# Patient Record
Sex: Male | Born: 1935 | Race: White | Hispanic: No | Marital: Married | Smoking: Former smoker
Health system: Southern US, Community
[De-identification: ages and names within clinical notes are randomized; demographics above are authoritative.]

## PROBLEM LIST (undated history)

## (undated) DIAGNOSIS — N419 Inflammatory disease of prostate, unspecified: Secondary | ICD-10-CM

## (undated) DIAGNOSIS — G459 Transient cerebral ischemic attack, unspecified: Secondary | ICD-10-CM

## (undated) DIAGNOSIS — N289 Disorder of kidney and ureter, unspecified: Secondary | ICD-10-CM

## (undated) DIAGNOSIS — I10 Essential (primary) hypertension: Secondary | ICD-10-CM

## (undated) DIAGNOSIS — E785 Hyperlipidemia, unspecified: Secondary | ICD-10-CM

## (undated) DIAGNOSIS — I639 Cerebral infarction, unspecified: Secondary | ICD-10-CM

## (undated) DIAGNOSIS — K219 Gastro-esophageal reflux disease without esophagitis: Secondary | ICD-10-CM

## (undated) DIAGNOSIS — I4891 Unspecified atrial fibrillation: Secondary | ICD-10-CM

## (undated) DIAGNOSIS — F0390 Unspecified dementia without behavioral disturbance: Secondary | ICD-10-CM

## (undated) HISTORY — PX: SKIN CANCER EXCISION: SHX779

---

## 2002-09-18 ENCOUNTER — Ambulatory Visit (HOSPITAL_COMMUNITY): Admission: RE | Admit: 2002-09-18 | Discharge: 2002-09-18 | Payer: Self-pay | Admitting: Nephrology

## 2002-09-19 ENCOUNTER — Inpatient Hospital Stay (HOSPITAL_COMMUNITY): Admission: RE | Admit: 2002-09-19 | Discharge: 2002-09-21 | Payer: Self-pay | Admitting: Nephrology

## 2002-09-19 ENCOUNTER — Encounter: Payer: Self-pay | Admitting: Nephrology

## 2004-12-31 ENCOUNTER — Ambulatory Visit: Payer: Self-pay | Admitting: Dermatology

## 2005-04-14 ENCOUNTER — Ambulatory Visit: Payer: Self-pay | Admitting: Unknown Physician Specialty

## 2007-12-06 ENCOUNTER — Ambulatory Visit: Payer: Self-pay | Admitting: Internal Medicine

## 2008-09-30 ENCOUNTER — Ambulatory Visit: Payer: Self-pay | Admitting: Internal Medicine

## 2009-10-31 ENCOUNTER — Emergency Department: Payer: Self-pay | Admitting: Emergency Medicine

## 2013-05-29 ENCOUNTER — Observation Stay: Payer: Self-pay | Admitting: Internal Medicine

## 2013-05-29 DIAGNOSIS — I059 Rheumatic mitral valve disease, unspecified: Secondary | ICD-10-CM

## 2013-05-29 LAB — COMPREHENSIVE METABOLIC PANEL
ALBUMIN: 3.8 g/dL (ref 3.4–5.0)
ALK PHOS: 50 U/L
ALT: 17 U/L (ref 12–78)
ANION GAP: 5 — AB (ref 7–16)
BILIRUBIN TOTAL: 0.6 mg/dL (ref 0.2–1.0)
BUN: 21 mg/dL — ABNORMAL HIGH (ref 7–18)
CALCIUM: 9.2 mg/dL (ref 8.5–10.1)
CHLORIDE: 105 mmol/L (ref 98–107)
CO2: 28 mmol/L (ref 21–32)
CREATININE: 1.43 mg/dL — AB (ref 0.60–1.30)
GFR CALC AF AMER: 50 — AB
GFR CALC NON AF AMER: 43 — AB
GLUCOSE: 109 mg/dL — AB (ref 65–99)
OSMOLALITY: 279 (ref 275–301)
POTASSIUM: 4.5 mmol/L (ref 3.5–5.1)
SGOT(AST): 14 U/L — ABNORMAL LOW (ref 15–37)
SODIUM: 138 mmol/L (ref 136–145)
Total Protein: 7.5 g/dL (ref 6.4–8.2)

## 2013-05-29 LAB — URINALYSIS, COMPLETE
Bacteria: NONE SEEN
Bilirubin,UR: NEGATIVE
Blood: NEGATIVE
Glucose,UR: NEGATIVE mg/dL (ref 0–75)
Hyaline Cast: 5
Ketone: NEGATIVE
Leukocyte Esterase: NEGATIVE
Nitrite: NEGATIVE
PH: 5 (ref 4.5–8.0)
Protein: NEGATIVE
RBC, UR: NONE SEEN /HPF (ref 0–5)
Specific Gravity: 1.013 (ref 1.003–1.030)
Squamous Epithelial: NONE SEEN

## 2013-05-29 LAB — CBC
HCT: 42.4 % (ref 40.0–52.0)
HGB: 13.8 g/dL (ref 13.0–18.0)
MCH: 29.2 pg (ref 26.0–34.0)
MCHC: 32.7 g/dL (ref 32.0–36.0)
MCV: 89 fL (ref 80–100)
PLATELETS: 140 10*3/uL — AB (ref 150–440)
RBC: 4.74 10*6/uL (ref 4.40–5.90)
RDW: 13.6 % (ref 11.5–14.5)
WBC: 6.2 10*3/uL (ref 3.8–10.6)

## 2013-05-29 LAB — TROPONIN I: TROPONIN-I: 0.02 ng/mL

## 2013-05-30 LAB — LIPID PANEL
Cholesterol: 128 mg/dL (ref 0–200)
HDL: 43 mg/dL (ref 40–60)
LDL CHOLESTEROL, CALC: 58 mg/dL (ref 0–100)
TRIGLYCERIDES: 137 mg/dL (ref 0–200)
VLDL Cholesterol, Calc: 27 mg/dL (ref 5–40)

## 2014-05-04 NOTE — H&P (Signed)
PATIENT NAME:  Albert Boone, STURGES MR#:  [id number] DATE OF BIRTH:  1922-03-26  DATE OF ADMISSION:  05/29/2013  PRIMARY CARE PHYSICIAN:  Dr. Emily Filbert    CHIEF COMPLAINT: Altered mental status and problems with speech.   HISTORY OF PRESENT ILLNESS: This is an 81 year old Caucasian male patient with history of questionable seizures, hypertension, chronic back pain who presents to the Emergency Room, brought in after his wife noticed that on waking up from sleep patient was confused. His symptoms lasted a total of 1 hour. He also had problems with his speech, not answering questions.  It sounds like aphasia. Here in the Emergency Room, the patient's symptoms have resolved. Initially, he was a little confused. He was talking about a meeting he was going to but could not remember what he was going for, but presently, he is alert and oriented x 3 back to normal. His son confirms that he is back to baseline.   He has not had any focal weakness or numbness. No change in medications. Blood glucose is normal.   PAST MEDICAL HISTORY:  1. Hypertension.  2. Chronic back pain.  3. Questionable seizures. Not on any medications.  4. Kidney biopsy in the past.   SOCIAL HISTORY: The patient ambulates on his own. He works out at [employer] every day. Does not smoke. Quit smoking in 1968.  Has occasional alcohol, 1 drink. No illicit drug use.   CODE STATUS:  Full code.  FAMILY HISTORY: Hypertension and stroke.   REVIEW OF SYSTEMS:  CONSTITUTIONAL: No fever, fatigue, weakness.  EYES: No blurred vision, pain, or redness.  EARS, NOSE, AND THROAT:  No tinnitus, ear pain, hearing loss.  RESPIRATORY: No cough, wheeze, hemoptysis.  CARDIOVASCULAR: No chest pain, orthopnea, edema.  GASTROINTESTINAL: No nausea, vomiting, diarrhea, abdominal pain.  GENITOURINARY: No dysuria, hematuria, frequency.  ENDOCRINE: No polyuria, nocturia, or thyroid problems. HEMATOLOGY AND LYMPHATIC: No anemia, easy bruising, bleeding.   INTEGUMENTARY: No acne, rash, lesion.  MUSCULOSKELETAL: Has chronic back pain.  NEUROLOGICAL: Had speech problems and confusion.  No focal numbness, weakness.  PSYCHIATRIC: No anxiety or depression.   HOME MEDICATION:  Not available at this time.   PHYSICAL EXAMINATION:  VITAL SIGNS: Temperature 97.8, pulse 68, respirations 18, blood pressure 148/79, saturating 96% on room air.  GENERAL: Obese Caucasian male patient sitting up in bed, seems comfortable, conversational, cooperative with exam.  PSYCHIATRIC: Alert and oriented x 3. Mood and affect appropriate. Judgment intact.  He is pleasant. HEENT: Atraumatic, normocephalic. Oral mucosa moist and pink. External ears and nose normal. No pallor. No icterus. Pupils bilaterally equal and reactive to light. No facial droop.  NECK: Supple. No thyromegaly or palpable lymph nodes. . Trachea midline. No carotid bruit, JVD.  CARDIOVASCULAR: S1, S2, without any murmurs. Peripheral pulses 2+.  No edema. RESPIRATORY: Normal work of breathing. Clear to auscultation on both sides.  GASTROINTESTINAL: Soft abdomen, nontender. Bowel sounds present. No  hepatosplenomegaly palpable.  GENITOURINARY: No significant bladder distention.  SKIN:  Warm and dry.  No petechiae,  rash, ulcers. MUSCULOSKELETAL: No joint swelling, redness or effusion in large joints. Normal muscle tone.  NEUROLOGICAL: Motor strength 5/5 in upper and lower extremities. Sensation is intact all over. Reflexes 2+. Cranial nerves II through XII intact.  LYMPHATIC: No cervical lymphadenopathy.   LABORATORY STUDIES: Show glucose 109, BUN 21, creatinine 1.43, sodium 130, potassium 4.5, chloride 105, bicarbonate 28. AST, ALT, alkaline phosphatase, bilirubin normal. Troponin less than 0.02. hemoglobin  13.8, platelets of 140,000.  RADIOLOGICAL DATA:  EKG shows sinus rhythm, right bundle branch block.   CT scan of the head without contrast: Generalized cerebral atrophy. No acute stroke. No mass  hemorrhage.   ASSESSMENT AND PLAN:  1. Acute confusion and problems with speech in a patient with hypertension. The patient's symptoms lasted a total of 1 hour. Blood pressure is elevated at 170/72. We will admit the patient under observation for TIA. Put him on a tele floor. Get neuro checks q. 4 hours. Check MRI, carotid Dopplers, and echocardiogram. We will check a fasting lipid profile. Put him on aspirin, statin. Presently, symptoms have resolved. Further management as per the test results in progress.  2. Hypertension, uncontrolled. Mildly elevated could be secondary to a stroke he could have had. Will continue his home medications and monitor the blood pressure.  3. Chronic kidney disease III. Creatinine is 1.43. He does mention he had a kidney biopsy in the past. Baseline is unknown. Needs to be monitored.  4. Deep vein thrombosis prophylaxis with Lovenox.   TIME SPENT TODAY ON THIS CASE:  45 minutes.    ____________________________ Leia Alf Kumiko Fishman, MD srs:dd D: 05/29/2013 17:36:33 ET T: 05/29/2013 18:00:30 ET JOB#: [id number]  cc: Alveta Heimlich R. Darvin Neighbours, MD, <Dictator> Rusty Aus, MD  Neita Carp MD ELECTRONICALLY SIGNED 06/08/2013 14:15

## 2014-05-04 NOTE — Discharge Summary (Signed)
PATIENT NAME:  Albert Boone, Albert Boone MR#:  [id number] DATE OF BIRTH:  1922/08/02  DATE OF ADMISSION:  05/29/2013 DATE OF DISCHARGE:  05/30/2013  DISCHARGE DIAGNOSES: 1.  Transient ischemic attack.  2.  Renal insufficiency. 3.  Hypertension.   DISCHARGE MEDICATIONS:  Omeprazole 20 mg b.i.d., valsartan 80 mg daily, simvastatin 20 mg at bedtime, terazosin 5 mg at bedtime, HCTZ 25 mg 1/2 tab daily, Plavix 75 mg daily.   REASON FOR ADMISSION: An 81 year old male presents with episode of altered mental status and slurred speech, consistent with TIA. Please see H and P for HPI, past medical history and physical exam.   HOSPITAL COURSE: The patient was admitted. Enzymes negative. Brain MRI negative. Carotid Doppler negative.  Blood pressure readily came down. He was asymptomatic, really on presentation to the ED. Since he cannot take any anti-inflammatories, he will go on Plavix for at least 1 month to follow up with Dr. Sabra Heck in 1 week. Any further symptoms, he will let me know.   ____________________________ Rusty Aus, MD mfm:ce D: 05/30/2013 15:33:45 ET T: 05/30/2013 19:11:02 ET JOB#: [id number]  cc: Rusty Aus, MD, <Dictator> Aspen Deterding Roselee Culver MD ELECTRONICALLY SIGNED 06/01/2013 8:30

## 2015-01-23 DIAGNOSIS — H353133 Nonexudative age-related macular degeneration, bilateral, advanced atrophic without subfoveal involvement: Secondary | ICD-10-CM | POA: Diagnosis not present

## 2015-01-23 DIAGNOSIS — Z961 Presence of intraocular lens: Secondary | ICD-10-CM | POA: Diagnosis not present

## 2015-02-14 DIAGNOSIS — M25511 Pain in right shoulder: Secondary | ICD-10-CM | POA: Diagnosis not present

## 2015-02-17 DIAGNOSIS — M47812 Spondylosis without myelopathy or radiculopathy, cervical region: Secondary | ICD-10-CM | POA: Diagnosis not present

## 2015-02-17 DIAGNOSIS — M5010 Cervical disc disorder with radiculopathy, unspecified cervical region: Secondary | ICD-10-CM | POA: Diagnosis not present

## 2015-02-24 DIAGNOSIS — M5010 Cervical disc disorder with radiculopathy, unspecified cervical region: Secondary | ICD-10-CM | POA: Diagnosis not present

## 2015-02-24 DIAGNOSIS — I952 Hypotension due to drugs: Secondary | ICD-10-CM | POA: Diagnosis not present

## 2015-03-06 DIAGNOSIS — Z0000 Encounter for general adult medical examination without abnormal findings: Secondary | ICD-10-CM | POA: Diagnosis not present

## 2015-03-06 DIAGNOSIS — E782 Mixed hyperlipidemia: Secondary | ICD-10-CM | POA: Diagnosis not present

## 2015-03-13 DIAGNOSIS — N183 Chronic kidney disease, stage 3 (moderate): Secondary | ICD-10-CM | POA: Diagnosis not present

## 2015-03-13 DIAGNOSIS — E782 Mixed hyperlipidemia: Secondary | ICD-10-CM | POA: Diagnosis not present

## 2015-03-13 DIAGNOSIS — G40109 Localization-related (focal) (partial) symptomatic epilepsy and epileptic syndromes with simple partial seizures, not intractable, without status epilepticus: Secondary | ICD-10-CM | POA: Diagnosis not present

## 2015-03-13 DIAGNOSIS — L570 Actinic keratosis: Secondary | ICD-10-CM | POA: Diagnosis not present

## 2015-04-21 DIAGNOSIS — Z1283 Encounter for screening for malignant neoplasm of skin: Secondary | ICD-10-CM | POA: Diagnosis not present

## 2015-04-21 DIAGNOSIS — L905 Scar conditions and fibrosis of skin: Secondary | ICD-10-CM | POA: Diagnosis not present

## 2015-04-21 DIAGNOSIS — Z85820 Personal history of malignant melanoma of skin: Secondary | ICD-10-CM | POA: Diagnosis not present

## 2015-04-21 DIAGNOSIS — D485 Neoplasm of uncertain behavior of skin: Secondary | ICD-10-CM | POA: Diagnosis not present

## 2015-04-21 DIAGNOSIS — L578 Other skin changes due to chronic exposure to nonionizing radiation: Secondary | ICD-10-CM | POA: Diagnosis not present

## 2015-04-21 DIAGNOSIS — L821 Other seborrheic keratosis: Secondary | ICD-10-CM | POA: Diagnosis not present

## 2015-04-21 DIAGNOSIS — L814 Other melanin hyperpigmentation: Secondary | ICD-10-CM | POA: Diagnosis not present

## 2015-04-21 DIAGNOSIS — Z85828 Personal history of other malignant neoplasm of skin: Secondary | ICD-10-CM | POA: Diagnosis not present

## 2015-04-21 DIAGNOSIS — L570 Actinic keratosis: Secondary | ICD-10-CM | POA: Diagnosis not present

## 2015-04-21 DIAGNOSIS — L853 Xerosis cutis: Secondary | ICD-10-CM | POA: Diagnosis not present

## 2015-04-21 DIAGNOSIS — L820 Inflamed seborrheic keratosis: Secondary | ICD-10-CM | POA: Diagnosis not present

## 2015-04-21 DIAGNOSIS — D1800 Hemangioma unspecified site: Secondary | ICD-10-CM | POA: Diagnosis not present

## 2015-07-10 DIAGNOSIS — K4090 Unilateral inguinal hernia, without obstruction or gangrene, not specified as recurrent: Secondary | ICD-10-CM | POA: Diagnosis not present

## 2015-07-24 DIAGNOSIS — M542 Cervicalgia: Secondary | ICD-10-CM | POA: Diagnosis not present

## 2015-07-24 DIAGNOSIS — R42 Dizziness and giddiness: Secondary | ICD-10-CM | POA: Diagnosis not present

## 2015-08-04 DIAGNOSIS — N3000 Acute cystitis without hematuria: Secondary | ICD-10-CM | POA: Diagnosis not present

## 2015-08-18 ENCOUNTER — Encounter: Payer: Self-pay | Admitting: Urology

## 2015-08-18 ENCOUNTER — Ambulatory Visit (INDEPENDENT_AMBULATORY_CARE_PROVIDER_SITE_OTHER): Payer: PPO | Admitting: Urology

## 2015-08-18 VITALS — Ht 68.0 in | Wt 169.0 lb

## 2015-08-18 DIAGNOSIS — R350 Frequency of micturition: Secondary | ICD-10-CM | POA: Diagnosis not present

## 2015-08-18 DIAGNOSIS — R32 Unspecified urinary incontinence: Secondary | ICD-10-CM | POA: Diagnosis not present

## 2015-08-18 LAB — MICROSCOPIC EXAMINATION

## 2015-08-18 LAB — URINALYSIS, COMPLETE
Bilirubin, UA: NEGATIVE
Glucose, UA: NEGATIVE
LEUKOCYTES UA: NEGATIVE
NITRITE UA: NEGATIVE
PH UA: 5 (ref 5.0–7.5)
Protein, UA: NEGATIVE
RBC UA: NEGATIVE
Specific Gravity, UA: 1.02 (ref 1.005–1.030)
Urobilinogen, Ur: 0.2 mg/dL (ref 0.2–1.0)

## 2015-08-18 LAB — BLADDER SCAN AMB NON-IMAGING: Scan Result: 51

## 2015-08-18 MED ORDER — TERAZOSIN HCL 1 MG PO CAPS: 5.0000 mg | ORAL_CAPSULE | Freq: Two times a day (BID) | ORAL | Status: DC

## 2015-08-18 MED ORDER — TERAZOSIN HCL 5 MG PO CAPS: 5.0000 mg | ORAL_CAPSULE | Freq: Two times a day (BID) | ORAL | 11 refills | Status: DC

## 2015-08-18 NOTE — Progress Notes (Signed)
08/18/2015 2:36 PM   Albert Boone 1922/02/02 [id number]  Referring provider: Rusty Aus, MD [facility] [place], [place] [address]  Chief Complaint  Patient presents with  . New Patient (Initial Visit)    urinary incontinence     HPI: For many months the patient has decreasing flow and frequency. He was put on terazosin and then doxazosin. The first drug worked much better and is back on it. He will take in the morning and his flow was excellent. At 2 in the afternoon his flow is slow. He voids every 2 hours both day and night. He may have had multiple TIAs in the past. He describes a cystoscopy many years ago. He has never had GU surgery is not get urinary tract infections  He is on 5 mg of terazosin without side effects  Modifying factors: There are no other modifying factors  Associated signs and symptoms: There are no other associated signs and symptoms Aggravating and relieving factors: There are no other aggravating or relieving factors Severity: Moderate Duration: Persistent   PMH: No past medical history on file.  Surgical History: No past surgical history on file.  Home Medications:    Medication List       Accurate as of 08/18/15  2:36 PM. Always use your most recent med list.          clopidogrel 75 MG tablet Commonly known as:  PLAVIX Take by mouth.   simvastatin 20 MG tablet Commonly known as:  ZOCOR TAKE 1 TABLET BY MOUTH AT BEDTIME   terazosin 1 MG capsule Commonly known as:  HYTRIN Take 1 mg by mouth at bedtime.       Allergies:  Allergies  Allergen Reactions  . Penicillins Rash    Family History: Family History  Problem Relation Age of Onset  . Prostate cancer Neg Hx   . Hematuria Neg Hx     Social History:  reports that he quit smoking about 49 years ago. He has never used smokeless tobacco. He reports that he drinks alcohol. He reports that he does not use  drugs.  ROS: UROLOGY Frequent Urination?: Yes Hard to postpone urination?: Yes Burning/pain with urination?: No Get up at night to urinate?: Yes Leakage of urine?: No Urine stream starts and stops?: No Trouble starting stream?: No Do you have to strain to urinate?: No Blood in urine?: No Urinary tract infection?: No Sexually transmitted disease?: No Injury to kidneys or bladder?: No Painful intercourse?: No Weak stream?: Yes Erection problems?: No Penile pain?: No  Gastrointestinal Nausea?: No Vomiting?: No Indigestion/heartburn?: No Diarrhea?: No Constipation?: No  Constitutional Fever: No Night sweats?: No Weight loss?: No Fatigue?: No  Skin Skin rash/lesions?: No Itching?: No  Eyes Blurred vision?: Yes Double vision?: No  Ears/Nose/Throat Sore throat?: No Sinus problems?: No  Hematologic/Lymphatic Swollen glands?: No Easy bruising?: No  Cardiovascular Leg swelling?: No Chest pain?: No  Respiratory Cough?: No Shortness of breath?: No  Endocrine Excessive thirst?: No  Musculoskeletal Back pain?: Yes Joint pain?: No  Neurological Headaches?: No Dizziness?: No  Psychologic Depression?: No Anxiety?: No  Physical Exam: Ht 5\' 8"  (1.727 m)   Wt 169 lb (76.7 kg)   BMI 25.70 kg/m   Constitutional:  Alert and oriented, No acute distress. HEENT: [place] AT, moist mucus membranes.  Trachea midline, no masses. Cardiovascular: No clubbing, cyanosis, or edema. Respiratory: Normal respiratory effort, no increased work of breathing. GI: Abdomen is soft, nontender,  nondistended, no abdominal masses GU: No CVA tenderness. 40 g benign prostate Skin: No rashes, bruises or suspicious lesions. Lymph: No cervical or inguinal adenopathy. Neurologic: Grossly intact, no focal deficits, moving all 4 extremities. Psychiatric: Normal mood and affect.  Laboratory Data: Lab Results  Component Value Date   WBC 6.2 05/29/2013   HGB 13.8 05/29/2013   HCT 42.4  05/29/2013   MCV 89 05/29/2013   PLT 140 (L) 05/29/2013    Lab Results  Component Value Date   CREATININE 1.43 (H) 05/29/2013    No results found for: PSA  No results found for: TESTOSTERONE  No results found for: HGBA1C  Urinalysis    Component Value Date/Time   COLORURINE Yellow 05/29/2013 1904   APPEARANCEUR Clear 05/29/2013 1904   LABSPEC 1.013 05/29/2013 1904   PHURINE 5.0 05/29/2013 1904   GLUCOSEU Negative 05/29/2013 1904   HGBUR Negative 05/29/2013 1904   BILIRUBINUR Negative 05/29/2013 1904   KETONESUR Negative 05/29/2013 1904   PROTEINUR Negative 05/29/2013 1904   NITRITE Negative 05/29/2013 1904   LEUKOCYTESUR Negative 05/29/2013 1904    Pertinent Imaging: None  Assessment & Plan:  The patient is most bothered I his slower flow in the afternoon is a partial responder to an alpha-blocker. He voids every 2 hours both day and night. The role of a higher dose described with relative side effects. The patient preferred to try to take 1 in the morning and 1 at night and thought that was fine under the circumstances  Prescription changed to 60 tablets per month. See in one month. His bladder scan residual today was 56 mL  1. Weak stream 2. Urinary frequency 3. Nighttime frequency - Urinalysis, Complete   No Follow-up on file.  Reece Packer, MD  [facility] [address], [place], [place] [address] [phone]

## 2015-08-20 ENCOUNTER — Encounter: Payer: Self-pay | Admitting: Occupational Medicine

## 2015-08-20 ENCOUNTER — Inpatient Hospital Stay
Admission: EM | Admit: 2015-08-20 | Discharge: 2015-08-22 | DRG: 871 | Disposition: A | Discharge status: Home Health | Payer: PPO | Attending: Internal Medicine | Admitting: Internal Medicine

## 2015-08-20 DIAGNOSIS — Z79899 Other long term (current) drug therapy: Secondary | ICD-10-CM

## 2015-08-20 DIAGNOSIS — A419 Sepsis, unspecified organism: Principal | ICD-10-CM

## 2015-08-20 DIAGNOSIS — R4182 Altered mental status, unspecified: Secondary | ICD-10-CM | POA: Diagnosis not present

## 2015-08-20 DIAGNOSIS — E872 Acidosis: Secondary | ICD-10-CM | POA: Diagnosis not present

## 2015-08-20 DIAGNOSIS — Z85828 Personal history of other malignant neoplasm of skin: Secondary | ICD-10-CM

## 2015-08-20 DIAGNOSIS — N401 Enlarged prostate with lower urinary tract symptoms: Secondary | ICD-10-CM | POA: Diagnosis not present

## 2015-08-20 DIAGNOSIS — Z880 Allergy status to penicillin: Secondary | ICD-10-CM | POA: Diagnosis not present

## 2015-08-20 DIAGNOSIS — Z8249 Family history of ischemic heart disease and other diseases of the circulatory system: Secondary | ICD-10-CM | POA: Diagnosis not present

## 2015-08-20 DIAGNOSIS — K219 Gastro-esophageal reflux disease without esophagitis: Secondary | ICD-10-CM | POA: Diagnosis not present

## 2015-08-20 DIAGNOSIS — Z87891 Personal history of nicotine dependence: Secondary | ICD-10-CM | POA: Diagnosis not present

## 2015-08-20 DIAGNOSIS — Z8673 Personal history of transient ischemic attack (TIA), and cerebral infarction without residual deficits: Secondary | ICD-10-CM | POA: Diagnosis not present

## 2015-08-20 DIAGNOSIS — R531 Weakness: Secondary | ICD-10-CM | POA: Diagnosis not present

## 2015-08-20 DIAGNOSIS — Z7902 Long term (current) use of antithrombotics/antiplatelets: Secondary | ICD-10-CM | POA: Diagnosis not present

## 2015-08-20 DIAGNOSIS — E785 Hyperlipidemia, unspecified: Secondary | ICD-10-CM | POA: Diagnosis present

## 2015-08-20 DIAGNOSIS — I10 Essential (primary) hypertension: Secondary | ICD-10-CM | POA: Diagnosis not present

## 2015-08-20 DIAGNOSIS — J189 Pneumonia, unspecified organism: Secondary | ICD-10-CM | POA: Diagnosis not present

## 2015-08-20 DIAGNOSIS — R410 Disorientation, unspecified: Secondary | ICD-10-CM

## 2015-08-20 DIAGNOSIS — I679 Cerebrovascular disease, unspecified: Secondary | ICD-10-CM | POA: Diagnosis not present

## 2015-08-20 HISTORY — DX: Hyperlipidemia, unspecified: E78.5

## 2015-08-20 HISTORY — DX: Inflammatory disease of prostate, unspecified: N41.9

## 2015-08-20 HISTORY — DX: Essential (primary) hypertension: I10

## 2015-08-20 HISTORY — DX: Cerebral infarction, unspecified: I63.9

## 2015-08-20 HISTORY — DX: Gastro-esophageal reflux disease without esophagitis: K21.9

## 2015-08-20 MED ORDER — SODIUM CHLORIDE 0.9 % IV BOLUS (SEPSIS)
1000.0000 mL | Freq: Once | INTRAVENOUS | Status: AC
  Administered 2015-08-21: 1000 mL via INTRAVENOUS

## 2015-08-20 MED ORDER — VANCOMYCIN HCL IN DEXTROSE 1-5 GM/200ML-% IV SOLN
1000.0000 mg | Freq: Once | INTRAVENOUS | Status: AC
  Administered 2015-08-21: 1000 mg via INTRAVENOUS
  Filled 2015-08-20: qty 200

## 2015-08-20 MED ORDER — SODIUM CHLORIDE 0.9 % IV BOLUS (SEPSIS)
250.0000 mL | Freq: Once | INTRAVENOUS | Status: AC
  Administered 2015-08-21: 250 mL via INTRAVENOUS

## 2015-08-20 MED ORDER — DEXTROSE 5 % IV SOLN
2.0000 g | Freq: Once | INTRAVENOUS | Status: AC
  Administered 2015-08-21: 2 g via INTRAVENOUS
  Filled 2015-08-20: qty 2

## 2015-08-20 MED ORDER — LEVOFLOXACIN IN D5W 750 MG/150ML IV SOLN
750.0000 mg | Freq: Once | INTRAVENOUS | Status: AC
  Administered 2015-08-21: 750 mg via INTRAVENOUS
  Filled 2015-08-20: qty 150

## 2015-08-20 NOTE — ED Notes (Addendum)
02 appied low sat 86%RA now O2 92% on 2L via [place]. Pt taking off clothes and vital machine wires. MD awaretalking about ordering a sitter.

## 2015-08-20 NOTE — ED Triage Notes (Signed)
Pt presents via EMS from home  Wife reports AMS PTA ems picked up. Pt was foaming at the mouth when EMS arrived Bilateral weakness EMS denies postdictal. Pt regained strengthen in route fighting EMS. EMS got palpated BP 180 and temp 99.0 HR 120.  Pt noted to be AMS.

## 2015-08-20 NOTE — ED Provider Notes (Signed)
[facility] Emergency Department Provider Note  ____________________________________________   First MD Initiated Contact with Patient 08/20/15 2350     (approximate)  I have reviewed the triage vital signs and the nursing notes.   HISTORY  Chief Complaint Altered Mental Status  Level V caveat - the patient has acute altered mental status and is unable to provide a reliable history or review of systems  HPI Albert Boone is a 82 y.o. male who arrives by EMS from home after his wife called 37 for the patient's acute altered mental status.  Reportedly he was "foaming at the mouth" when EMS arrived and they reported generalized bilateral weakness but states that he was not postictal.  They say that he regained his strength quickly and began fighting them in route.  He has an oral temperature of 99 but a heart rate in the 120s and a respiratory rate in the mid to upper 20s with an oxygen saturation of 86% with a good waveform.He is able to answer questions although he is very confused and does not know where he is or what happened.  There is no report of seizure-like activity.  He does have a history of "mini strokes".  No other additional history is available at this time although the patient states that he has not been ill recently and is currently denying any pain or discomfort.   Past Medical History:  Diagnosis Date  . GERD (gastroesophageal reflux disease)   . Hyperlipemia   . Hypertension   . Prostatitis   . Stroke [facility])    mini strokes    Patient Active Problem List   Diagnosis Date Noted  . Sepsis ([place]) 08/21/2015    Past Surgical History:  Procedure Laterality Date  . SKIN CANCER EXCISION     multiple times    Prior to Admission medications   Medication Sig Start Date End Date Taking? Authorizing Provider  clopidogrel (PLAVIX) 75 MG tablet Take by mouth. 01/24/15 01/24/16 Yes Historical Provider, MD  doxazosin (CARDURA) 2 MG  tablet Take 2 mg by mouth daily.   Yes Historical Provider, MD  Multiple Vitamins-Minerals (MULTIVITAMIN WITH MINERALS) tablet Take 1 tablet by mouth daily.   Yes Historical Provider, MD  omeprazole (PRILOSEC) 20 MG capsule Take 20 mg by mouth daily.   Yes Historical Provider, MD  simvastatin (ZOCOR) 20 MG tablet TAKE 1 TABLET BY MOUTH AT BEDTIME 08/14/15  Yes Historical Provider, MD  terazosin (HYTRIN) 5 MG capsule Take 1 capsule (5 mg total) by mouth 2 (two) times daily. 08/18/15   Bjorn Loser, MD    Allergies Penicillins  Family History  Problem Relation Age of Onset  . Hypertension Father   . Prostate cancer Neg Hx   . Hematuria Neg Hx     Social History Social History  Substance Use Topics  . Smoking status: Former Smoker    Quit date: 08/18/1966  . Smokeless tobacco: Never Used  . Alcohol use Yes    Review of Systems Level V caveat - unable to obtain due to altered mental status and acute illness  ____________________________________________   PHYSICAL EXAM:  VITAL SIGNS: ED Triage Vitals [08/20/15 2342]  Enc Vitals Group     BP (!) 139/110     Pulse Rate (!) 110     Resp (!) 23     Temp 98.9 F (37.2 C)     Temp Source Rectal     SpO2 90 %  Weight 162 lb 11.2 oz (73.8 kg)     Height 5\' 8"  (1.727 m)     Head Circumference      Peak Flow      Pain Score      Pain Loc      Pain Edu?      Excl. in [place]?     Constitutional: Alert But disoriented and confused, agitated and restless Eyes: Conjunctivae are normal. PERRL. EOMI. Head: Atraumatic. Nose: No congestion/rhinnorhea. Mouth/Throat: Mucous membranes are moist.  Oropharynx non-erythematous. Neck: No stridor.  No meningeal signs.   Cardiovascular: Tachycardia, regular rhythm. Good peripheral circulation. Grossly normal heart sounds.   Respiratory: Normal respiratory effort.  No retractions. Lungs CTAB. Gastrointestinal: Soft and nontender. No distention.  Musculoskeletal: No lower extremity  tenderness nor edema. No gross deformities of extremities. Neurologic:  Normal speech and language. No gross focal neurologic deficits are appreciated.  Confused, knows his name but does not know what happened or where he is.  Unable to participate and extensive neurological exam but his strength is normal throughout Skin:  Skin is warm, dry and intact. No rash noted.  ____________________________________________   LABS (all labs ordered are listed, but only abnormal results are displayed)  Labs Reviewed  LACTIC ACID, PLASMA - Abnormal; Notable for the following:       Result Value   Lactic Acid, Venous 2.8 (*)    All other components within normal limits  COMPREHENSIVE METABOLIC PANEL - Abnormal; Notable for the following:    CO2 21 (*)    Glucose, Bld 136 (*)    Creatinine, Ser 1.42 (*)    ALT 14 (*)    GFR calc non Af Amer 42 (*)    GFR calc Af Amer 48 (*)    All other components within normal limits  CBC WITH DIFFERENTIAL/PLATELET - Abnormal; Notable for the following:    Platelets 141 (*)    All other components within normal limits  URINALYSIS COMPLETEWITH MICROSCOPIC ([facility] ONLY) - Abnormal; Notable for the following:    Color, Urine YELLOW (*)    APPearance CLEAR (*)    Hgb urine dipstick 1+ (*)    Bacteria, UA FEW (*)    Squamous Epithelial / LPF 0-5 (*)    All other components within normal limits  CULTURE, BLOOD (ROUTINE X 2)  CULTURE, BLOOD (ROUTINE X 2)  URINE CULTURE  LACTIC ACID, PLASMA  LIPASE, BLOOD  TROPONIN I  PROTIME-INR  LACTIC ACID, PLASMA  TSH  HEMOGLOBIN A1C   ____________________________________________  EKG  ED ECG REPORT I, Tyja Gortney, the attending physician, personally viewed and interpreted this ECG.   Date: 08/20/2015  EKG Time: 23:42  Rate: 107  Rhythm: sinus tachycardia  Axis: Normal  Intervals:nonspecific intraventricular conduction delay  ST&T Change: Non-specific ST segment / T-wave changes, but no evidence of acute  ischemia.  ____________________________________________  RADIOLOGY   Ct Head Wo Contrast  Result Date: 08/21/2015 CLINICAL DATA:  Acute onset of altered mental status. Bilateral weakness. High blood pressure and tachycardia. Initial encounter. EXAM: CT HEAD WITHOUT CONTRAST TECHNIQUE: Contiguous axial images were obtained from the base of the skull through the vertex without intravenous contrast. COMPARISON:  MRI of the brain performed 05/30/2013, and CT of the head performed 05/29/2013 FINDINGS: There is no evidence of acute infarction, mass lesion, or intra- or extra-axial hemorrhage on CT. Prominence of ventricles and sulci reflects mild cortical volume loss. Mild cerebellar atrophy is noted. Mild periventricular white matter change likely reflects small  vessel ischemic microangiopathy. The brainstem and fourth ventricle are within normal limits. The basal ganglia are unremarkable in appearance. The cerebral hemispheres demonstrate grossly normal gray-white differentiation. No mass effect or midline shift is seen. There is no evidence of fracture; visualized osseous structures are unremarkable in appearance. The orbits are within normal limits. The paranasal sinuses and mastoid air cells are well-aerated. No significant soft tissue abnormalities are seen. IMPRESSION: 1. No acute intracranial pathology seen on CT. 2. Mild cortical volume loss and scattered small vessel ischemic microangiopathy. Electronically Signed   By: Garald Balding M.D.   On: 08/21/2015 01:39   Dg Chest Port 1 View  Result Date: 08/21/2015 CLINICAL DATA:  Acute onset of generalized weakness. High blood pressure and tachycardia. Altered mental status. Initial encounter. EXAM: PORTABLE CHEST 1 VIEW COMPARISON:  Chest radiograph performed 10/31/2009 FINDINGS: Left basilar airspace opacity raises concern for pneumonia. The right lung appears relatively clear. No definite pleural effusion or pneumothorax is seen. The cardiomediastinal  silhouette is borderline normal in size. No acute osseous abnormalities are identified. IMPRESSION: Left basilar airspace opacity raises concern for pneumonia. Electronically Signed   By: Garald Balding M.D.   On: 08/21/2015 00:54    ____________________________________________   PROCEDURES  Procedure(s) performed:   .Critical Care Performed by: Hinda Kehr Authorized by: Hinda Kehr   Critical care provider statement:    Critical care time (minutes):  45   Critical care time was exclusive of:  Separately billable procedures and treating other patients   Critical care was necessary to treat or prevent imminent or life-threatening deterioration of the following conditions:  Sepsis   Critical care was time spent personally by me on the following activities:  Development of treatment plan with patient or surrogate, discussions with consultants, evaluation of patient's response to treatment, examination of patient, obtaining history from patient or surrogate, ordering and performing treatments and interventions, ordering and review of laboratory studies, ordering and review of radiographic studies, pulse oximetry, re-evaluation of patient's condition and review of old charts      Critical Care performed: Yes, see critical care procedure note(s) ____________________________________________   INITIAL IMPRESSION / [place] / ED COURSE  Pertinent labs & imaging results that were available during my care of the patient were reviewed by me and considered in my medical decision making (see chart for details).  The patient is acutely altered with a waxing and waning level of alertness.  He has no idea where he is and is able answer simple questions but is very agitated and tried to pull off his leads and monitoring equipment.  I suspect sepsis and initiated code sepsis immediately after seeing him and will proceed with the usual plan of him.  Antibiotics, broad laboratory  evaluation, and 30 mL/kg of normal saline.  I will also obtain a CT head as soon as is feasible although he is speaking clearly and moving all 4 extremities with no deficits at this time.  I have ordered a sitter to be at his bedside given his level of agitation for his own safety.  Clinical Course  Value Comment By Time  Lactic Acid, Venous: (!!) 2.8 The patient's lactate is elevated at 2.8.  He does not have a leukocytosis but his vital sign abnormalities including hypoxemia at 86% and an elevated lactate are consistent with sepsis.  After empiric antibiotics and IV fluids he is back to normal in terms of his baseline mental status.  His head CT is unremarkable.  He  appears to have a left lower lobe pneumonia.  I updated the patient and family and have contacted the hospitalist for admission. Hinda Kehr, MD 08/10 [phone]    ____________________________________________  FINAL CLINICAL IMPRESSION(S) / ED DIAGNOSES  Final diagnoses:  CAP (community acquired pneumonia)  Delirium  Sepsis, due to unspecified organism [facility])     MEDICATIONS GIVEN DURING THIS VISIT:  Medications  clopidogrel (PLAVIX) tablet 75 mg (not administered)  simvastatin (ZOCOR) tablet 20 mg (not administered)  doxazosin (CARDURA) tablet 2 mg (not administered)  multivitamin with minerals tablet 1 tablet (not administered)  pantoprazole (PROTONIX) EC tablet 40 mg (not administered)  enoxaparin (LOVENOX) injection 40 mg (not administered)  sodium chloride flush (NS) 0.9 % injection 3 mL (not administered)  0.9 %  sodium chloride infusion ( Intravenous New Bag/Given 08/21/15 0442)  acetaminophen (TYLENOL) tablet 650 mg (not administered)    Or  acetaminophen (TYLENOL) suppository 650 mg (not administered)  HYDROcodone-acetaminophen (NORCO/VICODIN) 5-325 MG per tablet 1-2 tablet (not administered)  docusate sodium (COLACE) capsule 100 mg (not administered)  ondansetron (ZOFRAN) tablet 4 mg (not administered)    Or    ondansetron (ZOFRAN) injection 4 mg (not administered)  aztreonam (AZACTAM) 2 g in dextrose 5 % 50 mL IVPB (not administered)  vancomycin (VANCOCIN) IVPB 1000 mg/200 mL premix (not administered)  levofloxacin (LEVAQUIN) IVPB 750 mg (not administered)  sodium chloride 0.9 % bolus 1,000 mL (0 mLs Intravenous Stopped 08/21/15 0057)    And  sodium chloride 0.9 % bolus 1,000 mL (0 mLs Intravenous Stopped 08/21/15 0057)    And  sodium chloride 0.9 % bolus 250 mL (0 mLs Intravenous Stopped 08/21/15 0138)  levofloxacin (LEVAQUIN) IVPB 750 mg (0 mg Intravenous Stopped 08/21/15 0151)  aztreonam (AZACTAM) 2 g in dextrose 5 % 50 mL IVPB (0 g Intravenous Stopped 08/21/15 0139)  vancomycin (VANCOCIN) IVPB 1000 mg/200 mL premix (0 mg Intravenous Stopped 08/21/15 0120)     NEW OUTPATIENT MEDICATIONS STARTED DURING THIS VISIT:  Current Discharge Medication List        Note:  This document was prepared using Dragon voice recognition software and may include unintentional dictation errors.    Hinda Kehr, MD 08/21/15 [phone]

## 2015-08-21 ENCOUNTER — Emergency Department: Payer: PPO

## 2015-08-21 ENCOUNTER — Encounter: Payer: Self-pay | Admitting: Internal Medicine

## 2015-08-21 DIAGNOSIS — A419 Sepsis, unspecified organism: Secondary | ICD-10-CM | POA: Diagnosis not present

## 2015-08-21 DIAGNOSIS — Z8249 Family history of ischemic heart disease and other diseases of the circulatory system: Secondary | ICD-10-CM | POA: Diagnosis not present

## 2015-08-21 DIAGNOSIS — Z79899 Other long term (current) drug therapy: Secondary | ICD-10-CM | POA: Diagnosis not present

## 2015-08-21 DIAGNOSIS — Z880 Allergy status to penicillin: Secondary | ICD-10-CM | POA: Diagnosis not present

## 2015-08-21 DIAGNOSIS — I10 Essential (primary) hypertension: Secondary | ICD-10-CM | POA: Diagnosis not present

## 2015-08-21 DIAGNOSIS — Z7902 Long term (current) use of antithrombotics/antiplatelets: Secondary | ICD-10-CM | POA: Diagnosis not present

## 2015-08-21 DIAGNOSIS — Z85828 Personal history of other malignant neoplasm of skin: Secondary | ICD-10-CM | POA: Diagnosis not present

## 2015-08-21 DIAGNOSIS — Z87891 Personal history of nicotine dependence: Secondary | ICD-10-CM | POA: Diagnosis not present

## 2015-08-21 DIAGNOSIS — K219 Gastro-esophageal reflux disease without esophagitis: Secondary | ICD-10-CM | POA: Diagnosis not present

## 2015-08-21 DIAGNOSIS — R531 Weakness: Secondary | ICD-10-CM | POA: Diagnosis not present

## 2015-08-21 DIAGNOSIS — E785 Hyperlipidemia, unspecified: Secondary | ICD-10-CM | POA: Diagnosis not present

## 2015-08-21 DIAGNOSIS — R4182 Altered mental status, unspecified: Secondary | ICD-10-CM | POA: Diagnosis not present

## 2015-08-21 DIAGNOSIS — J189 Pneumonia, unspecified organism: Secondary | ICD-10-CM | POA: Diagnosis not present

## 2015-08-21 DIAGNOSIS — R410 Disorientation, unspecified: Secondary | ICD-10-CM | POA: Diagnosis not present

## 2015-08-21 DIAGNOSIS — Z8673 Personal history of transient ischemic attack (TIA), and cerebral infarction without residual deficits: Secondary | ICD-10-CM | POA: Diagnosis not present

## 2015-08-21 DIAGNOSIS — I679 Cerebrovascular disease, unspecified: Secondary | ICD-10-CM | POA: Diagnosis not present

## 2015-08-21 DIAGNOSIS — E872 Acidosis: Secondary | ICD-10-CM | POA: Diagnosis not present

## 2015-08-21 DIAGNOSIS — N401 Enlarged prostate with lower urinary tract symptoms: Secondary | ICD-10-CM | POA: Diagnosis not present

## 2015-08-21 LAB — TROPONIN I: Troponin I: <0.03 ng/mL (ref <0.03)

## 2015-08-21 LAB — URINALYSIS COMPLETE WITH MICROSCOPIC (ARMC ONLY)
Bilirubin Urine: NEGATIVE
GLUCOSE, UA: NEGATIVE mg/dL
Ketones, ur: NEGATIVE mg/dL
Leukocytes, UA: NEGATIVE
Nitrite: NEGATIVE
Protein, ur: NEGATIVE mg/dL
Specific Gravity, Urine: 1.014 (ref 1.005–1.030)
pH: 5 (ref 5.0–8.0)

## 2015-08-21 LAB — COMPREHENSIVE METABOLIC PANEL
ALBUMIN: 3.8 g/dL (ref 3.5–5.0)
ALK PHOS: 53 U/L (ref 38–126)
ALT: 14 U/L — ABNORMAL LOW (ref 17–63)
ANION GAP: 12 (ref 5–15)
AST: 24 U/L (ref 15–41)
BUN: 18 mg/dL (ref 6–20)
CALCIUM: 9 mg/dL (ref 8.9–10.3)
CO2: 21 mmol/L — AB (ref 22–32)
Chloride: 107 mmol/L (ref 101–111)
Creatinine, Ser: 1.42 mg/dL — ABNORMAL HIGH (ref 0.61–1.24)
GFR calc Af Amer: 48 mL/min — ABNORMAL LOW (ref >60)
GFR calc non Af Amer: 42 mL/min — ABNORMAL LOW (ref >60)
GLUCOSE: 136 mg/dL — AB (ref 65–99)
POTASSIUM: 3.9 mmol/L (ref 3.5–5.1)
SODIUM: 140 mmol/L (ref 135–145)
Total Bilirubin: 0.3 mg/dL (ref 0.3–1.2)
Total Protein: 6.9 g/dL (ref 6.5–8.1)

## 2015-08-21 LAB — CBC WITH DIFFERENTIAL/PLATELET
Basophils Absolute: 0 10*3/uL (ref 0–0.1)
Basophils Relative: 1 %
Eosinophils Absolute: 0.1 10*3/uL (ref 0–0.7)
Eosinophils Relative: 2 %
HCT: 42.2 % (ref 40.0–52.0)
HEMOGLOBIN: 14.3 g/dL (ref 13.0–18.0)
LYMPHS ABS: 1.1 10*3/uL (ref 1.0–3.6)
LYMPHS PCT: 19 %
MCH: 29.9 pg (ref 26.0–34.0)
MCHC: 34 g/dL (ref 32.0–36.0)
MCV: 88.1 fL (ref 80.0–100.0)
Monocytes Absolute: 0.4 10*3/uL (ref 0.2–1.0)
Monocytes Relative: 6 %
NEUTROS PCT: 72 %
Neutro Abs: 4.2 10*3/uL (ref 1.4–6.5)
Platelets: 141 10*3/uL — ABNORMAL LOW (ref 150–440)
RBC: 4.79 MIL/uL (ref 4.40–5.90)
RDW: 14 % (ref 11.5–14.5)
WBC: 5.7 10*3/uL (ref 3.8–10.6)

## 2015-08-21 LAB — LACTIC ACID, PLASMA
Lactic Acid, Venous: 2.8 mmol/L (ref 0.5–1.9)
Lactic Acid, Venous: 0.7 mmol/L (ref 0.5–1.9)
Lactic Acid, Venous: 0.7 mmol/L (ref 0.5–1.9)

## 2015-08-21 LAB — HEMOGLOBIN A1C: Hgb A1c MFr Bld: 5.8 % (ref 4.0–6.0)

## 2015-08-21 LAB — PROTIME-INR
INR: 1.11
Prothrombin Time: 14.4 seconds (ref 11.4–15.2)

## 2015-08-21 LAB — TSH: TSH: 0.794 u[IU]/mL (ref 0.350–4.500)

## 2015-08-21 LAB — LIPASE, BLOOD: Lipase: 39 U/L (ref 11–51)

## 2015-08-21 MED ORDER — DEXTROSE 5 % IV SOLN
2.0000 g | Freq: Three times a day (TID) | INTRAVENOUS | Status: DC
  Filled 2015-08-21 (×4): qty 2

## 2015-08-21 MED ORDER — PANTOPRAZOLE SODIUM 40 MG PO TBEC
40.0000 mg | DELAYED_RELEASE_TABLET | Freq: Every day | ORAL | Status: DC
  Administered 2015-08-21 – 2015-08-22 (×2): 40 mg via ORAL
  Filled 2015-08-21 (×2): qty 1

## 2015-08-21 MED ORDER — ONDANSETRON HCL 4 MG/2ML IJ SOLN: 4.0000 mg | Freq: Four times a day (QID) | INTRAMUSCULAR | Status: DC | PRN

## 2015-08-21 MED ORDER — DOCUSATE SODIUM 100 MG PO CAPS
100.0000 mg | ORAL_CAPSULE | Freq: Two times a day (BID) | ORAL | Status: DC
  Administered 2015-08-21 – 2015-08-22 (×3): 100 mg via ORAL
  Filled 2015-08-21 (×2): qty 1

## 2015-08-21 MED ORDER — ENOXAPARIN SODIUM 40 MG/0.4ML ~~LOC~~ SOLN
40.0000 mg | SUBCUTANEOUS | Status: DC
  Administered 2015-08-21: 40 mg via SUBCUTANEOUS
  Filled 2015-08-21: qty 0.4

## 2015-08-21 MED ORDER — ONDANSETRON HCL 4 MG PO TABS: 4.0000 mg | ORAL_TABLET | Freq: Four times a day (QID) | ORAL | Status: DC | PRN

## 2015-08-21 MED ORDER — LEVOFLOXACIN IN D5W 750 MG/150ML IV SOLN
750.0000 mg | INTRAVENOUS | Status: DC
  Filled 2015-08-21: qty 150

## 2015-08-21 MED ORDER — ACETAMINOPHEN 325 MG PO TABS: 650.0000 mg | ORAL_TABLET | Freq: Four times a day (QID) | ORAL | Status: DC | PRN

## 2015-08-21 MED ORDER — ACETAMINOPHEN 650 MG RE SUPP: 650.0000 mg | Freq: Four times a day (QID) | RECTAL | Status: DC | PRN

## 2015-08-21 MED ORDER — SODIUM CHLORIDE 0.9 % IV SOLN
INTRAVENOUS | Status: DC
  Administered 2015-08-21 (×2): via INTRAVENOUS

## 2015-08-21 MED ORDER — ADULT MULTIVITAMIN W/MINERALS CH
1.0000 | ORAL_TABLET | Freq: Every day | ORAL | Status: DC
  Administered 2015-08-21: 1 via ORAL
  Filled 2015-08-21: qty 1

## 2015-08-21 MED ORDER — HYDROCODONE-ACETAMINOPHEN 5-325 MG PO TABS: 1.0000 | ORAL_TABLET | ORAL | Status: DC | PRN

## 2015-08-21 MED ORDER — DOXAZOSIN MESYLATE 4 MG PO TABS
2.0000 mg | ORAL_TABLET | Freq: Every day | ORAL | Status: DC
  Administered 2015-08-21 – 2015-08-22 (×2): 2 mg via ORAL
  Filled 2015-08-21 (×2): qty 1

## 2015-08-21 MED ORDER — SIMVASTATIN 20 MG PO TABS
20.0000 mg | ORAL_TABLET | Freq: Every day | ORAL | Status: DC
  Administered 2015-08-21: 20 mg via ORAL
  Filled 2015-08-21: qty 1

## 2015-08-21 MED ORDER — SODIUM CHLORIDE 0.9% FLUSH
3.0000 mL | Freq: Two times a day (BID) | INTRAVENOUS | Status: DC
  Administered 2015-08-21 – 2015-08-22 (×2): 3 mL via INTRAVENOUS

## 2015-08-21 MED ORDER — VANCOMYCIN HCL IN DEXTROSE 1-5 GM/200ML-% IV SOLN
1000.0000 mg | INTRAVENOUS | Status: DC
  Administered 2015-08-21: 1000 mg via INTRAVENOUS
  Filled 2015-08-21: qty 200

## 2015-08-21 MED ORDER — CLOPIDOGREL BISULFATE 75 MG PO TABS
75.0000 mg | ORAL_TABLET | Freq: Every day | ORAL | Status: DC
  Administered 2015-08-21 – 2015-08-22 (×2): 75 mg via ORAL
  Filled 2015-08-21 (×2): qty 1

## 2015-08-21 NOTE — Progress Notes (Signed)
Pt admitted with sepsis due to Left LL pneumonia. Cont IV levaquin F/u BC Wbc count trending down  Spoke with pt and son

## 2015-08-21 NOTE — H&P (Signed)
Albert Boone is an 82 y.o. male.   Chief Complaint: Altered mental status HPI: The patient with past medical history of seizure and BPH presents to the emergency department after what his wife describes his seizure activity. She states that he was shaking and foaming at the mouth. She called 911 because she could not get the patient to respond or stop shaking. Oxygen saturation on room air was approximately 85% and he was found to have lactic acidosis. Chest x-ray revealed a left lower lobe pneumonia. Emergency department staff obtain blood cultures prior to starting broad-spectrum antibiotics at which time they called the hospitalist service for further management.   Past Medical History:  Diagnosis Date  . GERD (gastroesophageal reflux disease)   . Hyperlipemia   . Hypertension   . Prostatitis   . Stroke [facility])    mini strokes    Past Surgical History:  Procedure Laterality Date  . SKIN CANCER EXCISION     multiple times    Family History  Problem Relation Age of Onset  . Hypertension Father   . Prostate cancer Neg Hx   . Hematuria Neg Hx    Social History:  reports that he quit smoking about 49 years ago. He has never used smokeless tobacco. He reports that he drinks alcohol. He reports that he does not use drugs.  Allergies:  Allergies  Allergen Reactions  . Penicillins Rash    Prior to Admission medications   Medication Sig Start Date End Date Taking? Authorizing Provider  clopidogrel (PLAVIX) 75 MG tablet Take by mouth. 01/24/15 01/24/16 Yes Historical Provider, MD  doxazosin (CARDURA) 2 MG tablet Take 2 mg by mouth daily.   Yes Historical Provider, MD  Multiple Vitamins-Minerals (MULTIVITAMIN WITH MINERALS) tablet Take 1 tablet by mouth daily.   Yes Historical Provider, MD  omeprazole (PRILOSEC) 20 MG capsule Take 20 mg by mouth daily.   Yes Historical Provider, MD  simvastatin (ZOCOR) 20 MG tablet TAKE 1 TABLET BY MOUTH AT BEDTIME 08/14/15  Yes Historical  Provider, MD  terazosin (HYTRIN) 5 MG capsule Take 1 capsule (5 mg total) by mouth 2 (two) times daily. 08/18/15   Bjorn Loser, MD     Results for orders placed or performed during the hospital encounter of 08/20/15 (from the past 48 hour(s))  Urinalysis complete, with microscopic ([facility] only)     Status: Abnormal (Preliminary result)   Collection Time: 08/21/15 12:05 AM  Result Value Ref Range   Color, Urine YELLOW (A) YELLOW   APPearance CLEAR (A) CLEAR   Glucose, UA NEGATIVE NEGATIVE mg/dL   Bilirubin Urine NEGATIVE NEGATIVE   Ketones, ur NEGATIVE NEGATIVE mg/dL   Specific Gravity, Urine 1.014 1.005 - 1.030   Hgb urine dipstick 1+ (A) NEGATIVE   pH 5.0 5.0 - 8.0   Protein, ur NEGATIVE NEGATIVE mg/dL   Nitrite NEGATIVE NEGATIVE   Leukocytes, UA NEGATIVE NEGATIVE   RBC / HPF PENDING 0 - 5 RBC/hpf   WBC, UA PENDING 0 - 5 WBC/hpf   Bacteria, UA PENDING NONE SEEN   Squamous Epithelial / LPF PENDING NONE SEEN  Lactic acid, plasma     Status: Abnormal   Collection Time: 08/21/15 12:06 AM  Result Value Ref Range   Lactic Acid, Venous 2.8 (HH) 0.5 - 1.9 mmol/L    Comment: CRITICAL RESULT CALLED TO, READ BACK BY AND VERIFIED WITH KIMREY BROWN ON 08/21/15 AT 0107 BY TLB   Comprehensive metabolic panel     Status: Abnormal  Collection Time: 08/21/15 12:06 AM  Result Value Ref Range   Sodium 140 135 - 145 mmol/L   Potassium 3.9 3.5 - 5.1 mmol/L   Chloride 107 101 - 111 mmol/L   CO2 21 (L) 22 - 32 mmol/L   Glucose, Bld 136 (H) 65 - 99 mg/dL   BUN 18 6 - 20 mg/dL   Creatinine, Ser 1.42 (H) 0.61 - 1.24 mg/dL   Calcium 9.0 8.9 - 10.3 mg/dL   Total Protein 6.9 6.5 - 8.1 g/dL   Albumin 3.8 3.5 - 5.0 g/dL   AST 24 15 - 41 U/L   ALT 14 (L) 17 - 63 U/L   Alkaline Phosphatase 53 38 - 126 U/L   Total Bilirubin 0.3 0.3 - 1.2 mg/dL   GFR calc non Af Amer 42 (L) >60 mL/min   GFR calc Af Amer 48 (L) >60 mL/min    Comment: (NOTE) The eGFR has been calculated using the CKD EPI  equation. This calculation has not been validated in all clinical situations. eGFR's persistently <60 mL/min signify possible Chronic Kidney Disease.    Anion gap 12 5 - 15  Lipase, blood     Status: None   Collection Time: 08/21/15 12:06 AM  Result Value Ref Range   Lipase 39 11 - 51 U/L  Troponin I     Status: None   Collection Time: 08/21/15 12:06 AM  Result Value Ref Range   Troponin I <0.03 <0.03 ng/mL  CBC WITH DIFFERENTIAL     Status: Abnormal   Collection Time: 08/21/15 12:06 AM  Result Value Ref Range   WBC 5.7 3.8 - 10.6 K/uL   RBC 4.79 4.40 - 5.90 MIL/uL   Hemoglobin 14.3 13.0 - 18.0 g/dL   HCT 42.2 40.0 - 52.0 %   MCV 88.1 80.0 - 100.0 fL   MCH 29.9 26.0 - 34.0 pg   MCHC 34.0 32.0 - 36.0 g/dL   RDW 14.0 11.5 - 14.5 %   Platelets 141 (L) 150 - 440 K/uL   Neutrophils Relative % 72 %   Neutro Abs 4.2 1.4 - 6.5 K/uL   Lymphocytes Relative 19 %   Lymphs Abs 1.1 1.0 - 3.6 K/uL   Monocytes Relative 6 %   Monocytes Absolute 0.4 0.2 - 1.0 K/uL   Eosinophils Relative 2 %   Eosinophils Absolute 0.1 0 - 0.7 K/uL   Basophils Relative 1 %   Basophils Absolute 0.0 0 - 0.1 K/uL  Protime-INR     Status: None   Collection Time: 08/21/15 12:06 AM  Result Value Ref Range   Prothrombin Time 14.4 11.4 - 15.2 seconds   INR 1.11    Ct Head Wo Contrast  Result Date: 08/21/2015 CLINICAL DATA:  Acute onset of altered mental status. Bilateral weakness. High blood pressure and tachycardia. Initial encounter. EXAM: CT HEAD WITHOUT CONTRAST TECHNIQUE: Contiguous axial images were obtained from the base of the skull through the vertex without intravenous contrast. COMPARISON:  MRI of the brain performed 05/30/2013, and CT of the head performed 05/29/2013 FINDINGS: There is no evidence of acute infarction, mass lesion, or intra- or extra-axial hemorrhage on CT. Prominence of ventricles and sulci reflects mild cortical volume loss. Mild cerebellar atrophy is noted. Mild periventricular white  matter change likely reflects small vessel ischemic microangiopathy. The brainstem and fourth ventricle are within normal limits. The basal ganglia are unremarkable in appearance. The cerebral hemispheres demonstrate grossly normal gray-white differentiation. No mass effect or midline shift is seen.  There is no evidence of fracture; visualized osseous structures are unremarkable in appearance. The orbits are within normal limits. The paranasal sinuses and mastoid air cells are well-aerated. No significant soft tissue abnormalities are seen. IMPRESSION: 1. No acute intracranial pathology seen on CT. 2. Mild cortical volume loss and scattered small vessel ischemic microangiopathy. Electronically Signed   By: Garald Balding M.D.   On: 08/21/2015 01:39   Dg Chest Port 1 View  Result Date: 08/21/2015 CLINICAL DATA:  Acute onset of generalized weakness. High blood pressure and tachycardia. Altered mental status. Initial encounter. EXAM: PORTABLE CHEST 1 VIEW COMPARISON:  Chest radiograph performed 10/31/2009 FINDINGS: Left basilar airspace opacity raises concern for pneumonia. The right lung appears relatively clear. No definite pleural effusion or pneumothorax is seen. The cardiomediastinal silhouette is borderline normal in size. No acute osseous abnormalities are identified. IMPRESSION: Left basilar airspace opacity raises concern for pneumonia. Electronically Signed   By: Garald Balding M.D.   On: 08/21/2015 00:54    Review of Systems  Constitutional: Negative for chills and fever.  HENT: Negative for sore throat and tinnitus.   Eyes: Negative for blurred vision and redness.  Respiratory: Negative for cough and shortness of breath.   Cardiovascular: Negative for chest pain, palpitations, orthopnea and PND.  Gastrointestinal: Negative for abdominal pain, diarrhea, nausea and vomiting.  Genitourinary: Negative for dysuria, frequency and urgency.  Musculoskeletal: Negative for joint pain and myalgias.   Skin: Negative for rash.       No lesions  Neurological: Negative for speech change, focal weakness and weakness.  Endo/Heme/Allergies: Does not bruise/bleed easily.       No temperature intolerance  Psychiatric/Behavioral: Negative for depression and suicidal ideas.    Blood pressure (!) 142/93, pulse 85, temperature 97.5 F (36.4 C), temperature source Oral, resp. rate 16, height 5' 8"  (1.727 m), weight 73.8 kg (162 lb 11.2 oz), SpO2 98 %. Physical Exam  Vitals reviewed. Constitutional: He is oriented to person, place, and time. He appears well-developed and well-nourished. No distress.  HENT:  Head: Normocephalic and atraumatic.  Mouth/Throat: Oropharynx is clear and moist. No oropharyngeal exudate.  Eyes: Conjunctivae and EOM are normal. Pupils are equal, round, and reactive to light. No scleral icterus.  Neck: Normal range of motion. Neck supple. No JVD present. No tracheal deviation present. No thyromegaly present.  Cardiovascular: Normal rate and regular rhythm.  Exam reveals no gallop and no friction rub.   No murmur heard. Respiratory: Breath sounds normal. No respiratory distress.  GI: Soft. Bowel sounds are normal. He exhibits no distension. There is no tenderness.  Genitourinary:  Genitourinary Comments: Deferred  Lymphadenopathy:    He has no cervical adenopathy.  Neurological: He is alert and oriented to person, place, and time. No cranial nerve deficit.  Skin: Skin is warm and dry. No rash noted. No erythema.  Psychiatric: He has a normal mood and affect. His behavior is normal. Judgment and thought content normal.     Assessment/Plan This is a 82 year old male admitted for sepsis secondary to pneumonia. 1. Sepsis: The patient is criteria tachycardia and tachypnea. He is hemodynamically stable. Follow blood cultures for growth sensitivity. Continue broad spectrum antibiotics. 2. Pneumonia: The patient has received aztreonam, Levaquin and vancomycin. Discontinue  vancomycin if MRSA PCR is negative. 3. Cerebrovascular disease: CT head negative. Continue Plavix. 4. Hyperlipidemia: Continue statin therapy 5. BPH: With urinary symptoms. Continue terazosin 6. DVT prophylaxis: Lovenox 7. GI prophylaxis: Continue PPI per home regimen The patient is a full  code. Time spent on admission was in patient care proxy 45 minutes  Harrie Foreman, MD 08/21/2015, 3:13 AM

## 2015-08-21 NOTE — ED Notes (Signed)
Family at the bedside. Pt is resting in bed no distress noted.

## 2015-08-21 NOTE — ED Notes (Signed)
Dr. Karma Greaser made aware of Pt being a Code Sepsis.

## 2015-08-21 NOTE — ED Notes (Signed)
MD at bedside to update the family.

## 2015-08-21 NOTE — ED Notes (Signed)
Patient transported to CT 

## 2015-08-21 NOTE — Progress Notes (Signed)
Pharmacy Antibiotic Note  Albert Boone is a 82 y.o. male admitted on 08/20/2015 with sepsis.  Pharmacy has been consulted for vancomycin, aztreonam, and Levaquin dosing.  Plan: DW 73.8 kg  Vd 68.4kg kei 0.032 hr-1  T1/2 22 hours Vancomycin 1 gram q 24 hours ordered. Level before 5th dose. Goal trough 15-20.  Aztreonam 2 grams q 8 hours ordered.  Levaquin 750 mg q 48 hours ordered.  Height: 5\' 8"  (172.7 cm) Weight: 171 lb 8 oz (77.8 kg) IBW/kg (Calculated) : 68.4  Temp (24hrs), Avg:98 F (36.7 C), Min:97.5 F (36.4 C), Max:98.9 F (37.2 C)   Recent Labs Lab 08/21/15 0006  WBC 5.7  CREATININE 1.42*  LATICACIDVEN 2.8*    Estimated Creatinine Clearance: 32.8 mL/min (by C-G formula based on SCr of 1.42 mg/dL).    Allergies  Allergen Reactions  . Penicillins Rash    Antimicrobials this admission: vancomycin  >>  aztreonam  >>  Levaquin  Dose adjustments this admission:   Microbiology results: 8/10 BCx: pending 8/10 UCx: pending    8/10 UA: (-) 8/10 CXR: L basilar opacity  Thank you for allowing pharmacy to be a part of this patient's care.  Supreme Rybarczyk S 08/21/2015 4:44 AM

## 2015-08-21 NOTE — Care Management Note (Signed)
Case Management Note  Patient Details  Name: Albert Boone MRN: [id number] Date of Birth: 12-03-1922  Subjective/Objective:  Spoke with patient spouse and daughter at the bedside. Patient is from home and stated that he  Ambulates normally without assistance. He is in the hospital with pneumonia and has significant weakness from illlness.  Patient has the support of his spouse and adult grandson wthat resides in the same home as well as his adult daughter who helps with decisions. Denies issues with filling Rx and PCP is Dr Emily Filbert. [facility] provider list was given and family will discuss choice of [facility] agencies. [name] given my contact information and will contact me with choice of agency. No other CM needs identified.               Action/Plan: Home with HH.   Expected Discharge Date:  08/23/15               Expected Discharge Plan:  [place]  In-House Referral:     Discharge planning Services  CM Consult  Post Acute Care Choice:    Choice offered to:  Patient, Adult Children, Spouse  DME Arranged:  N/A DME Agency:     HH Arranged:  PT, RN [place] Agency:     Status of Service:  In process, will continue to follow  If discussed at Long Length of Stay Meetings, dates discussed:    Additional Comments:  Alvie Heidelberg, RN 08/21/2015, 3:04 PM

## 2015-08-21 NOTE — Evaluation (Signed)
Physical Therapy Evaluation Patient Details Name: Albert Boone MRN: [id number] DOB: 1922-06-23 Today's Date: 08/21/2015   History of Present Illness  Pt is a 82 y.o. male presenting to hospital with AMS (EMS called to home with pt foaming at mouth and O2 85% on RA; lactic acidosis noted in ED).  Pt admitted with sepsis secondary to L LL PNA.  PMH includes h/o "mini" strokes, htn, prostatitis, GERD.  Clinical Impression  Prior to admission, pt was independent without AD.  Pt lives with his wife in 1 level condo (no stairs).  Currently pt is min assist supine to sit, CGA for transfers, and CGA ambulating 120 feet no AD.  Pt initially unsteady but with continued mobility balance improved (pt still mildly unsteady but no overt loss of balance noted requiring assist to steady).  Pt would benefit from skilled PT to address noted impairments and functional limitations.  Recommend pt discharge to home with HHPT and support of family when medically appropriate (SBA for functional mobility initially).  Will continue to assess for DME needs for ambulation during hospital stay.     Follow Up Recommendations Home health PT;Supervision for mobility/OOB    Equipment Recommendations   (TBD)    Recommendations for Other Services       Precautions / Restrictions Precautions Precautions: Fall Restrictions Weight Bearing Restrictions: No      Mobility  Bed Mobility Overal bed mobility: Needs Assistance Bed Mobility: Supine to Sit     Supine to sit: Min assist;HOB elevated     General bed mobility comments: assist for trunk (pt with difficulty getting out of bed d/t increased depression in mattress middle of bed); increased time to perform and scoot to edge of bed  Transfers Overall transfer level: Needs assistance Equipment used: None Transfers: Sit to/from Stand (x5 trials) Sit to Stand: Min guard         General transfer comment: pt initially unsteady standing from edge of  bed (pushing B LE's against bed for balance initially) but with increased repetition pt steady without loss of balance  Ambulation/Gait Ambulation/Gait assistance: Min guard Ambulation Distance (Feet): 120 Feet Assistive device: None   Gait velocity: decreased   General Gait Details: 3x10 reps B standing marching initially (improved balance noted with repetition); decreased B step length/foot clearance/heelstrike (pt's daughter reports this is baseline for pt); mildly unsteady but no overt loss of balance noted requiring assist to steady  Stairs            Wheelchair Mobility    Modified Rankin (Stroke Patients Only)       Balance Overall balance assessment: Needs assistance Sitting-balance support: No upper extremity supported;Feet supported Sitting balance-Leahy Scale: Good     Standing balance support: No upper extremity supported;During functional activity Standing balance-Leahy Scale: Good Standing balance comment: with ambulation mildly unsteady but no overt loss of balance noted requiring assist to steady                             Pertinent Vitals/Pain Pain Assessment: No/denies pain  Nursing cleared PT to trial pt on RA with ambulation (O2 95% on RA at rest; decreased to 89% with ambulation on RA but with vc's for breathing technique able to increase up to 94% on RA).  HR WFL during session.    Home Living Family/patient expects to be discharged to:: Private residence Living Arrangements: Spouse/significant other;Other relatives ([place] staying with temporarily) Available Help at  Discharge: Family Type of Home:  (Condo) Home Access: Level entry     Home Layout: One level Home Equipment: Cane - single point      Prior Function Level of Independence: Independent         Comments: Pt with one fall about 2-3 weeks ago (pt blacked out when standing from chair).  Pt goes to [facility] 5x/week and uses stationary bike.     Hand Dominance         Extremity/Trunk Assessment   Upper Extremity Assessment: Generalized weakness           Lower Extremity Assessment: Generalized weakness         Communication   Communication: HOH  Cognition Arousal/Alertness: Awake/alert Behavior During Therapy: WFL for tasks assessed/performed Overall Cognitive Status: Within Functional Limits for tasks assessed                      General Comments General comments (skin integrity, edema, etc.): pt laying in bed with family present (daughter and son)  Nursing cleared pt for participation in physical therapy.  Pt agreeable to PT session.    Exercises General Exercises - Lower Extremity Ankle Circles/Pumps: AROM;Strengthening;Both;10 reps;Supine Quad Sets: AROM;Strengthening;Both;10 reps;Supine Short Arc Quad: AROM;Strengthening;Both;10 reps;Supine Heel Slides: AROM;Strengthening;Both;10 reps;Supine Hip ABduction/ADduction: AROM;Strengthening;Both;10 reps;Supine      Assessment/Plan    PT Assessment Patient needs continued PT services  PT Diagnosis Difficulty walking;Generalized weakness   PT Problem List Decreased activity tolerance;Decreased balance;Decreased mobility  PT Treatment Interventions DME instruction;Gait training;Functional mobility training;Therapeutic activities;Therapeutic exercise;Balance training;Patient/family education   PT Goals (Current goals can be found in the Care Plan section) Acute Rehab PT Goals Patient Stated Goal: to go home PT Goal Formulation: With patient Time For Goal Achievement: 09/04/15 Potential to Achieve Goals: Good    Frequency Min 2X/week   Barriers to discharge        Co-evaluation               End of Session Equipment Utilized During Treatment: Gait belt;Oxygen Activity Tolerance: Patient limited by fatigue Patient left: in chair;with call bell/phone within reach;with chair alarm set Nurse Communication: Mobility status;Precautions (O2 desaturation with  activity)         Time: [id number] PT Time Calculation (min) (ACUTE ONLY): 38 min   Charges:   PT Evaluation $PT Eval Low Complexity: 1 Procedure PT Treatments $Therapeutic Exercise: 8-22 mins $Therapeutic Activity: 8-22 mins   PT G CodesLeitha Bleak 10-Sep-2015, 12:10 PM Leitha Bleak, [place]

## 2015-08-22 DIAGNOSIS — J189 Pneumonia, unspecified organism: Secondary | ICD-10-CM | POA: Diagnosis not present

## 2015-08-22 DIAGNOSIS — I679 Cerebrovascular disease, unspecified: Secondary | ICD-10-CM | POA: Diagnosis not present

## 2015-08-22 DIAGNOSIS — E785 Hyperlipidemia, unspecified: Secondary | ICD-10-CM | POA: Diagnosis not present

## 2015-08-22 DIAGNOSIS — A419 Sepsis, unspecified organism: Secondary | ICD-10-CM | POA: Diagnosis not present

## 2015-08-22 DIAGNOSIS — N401 Enlarged prostate with lower urinary tract symptoms: Secondary | ICD-10-CM | POA: Diagnosis not present

## 2015-08-22 LAB — URINE CULTURE: Culture: NO GROWTH

## 2015-08-22 MED ORDER — LEVOFLOXACIN 250 MG PO TABS
250.0000 mg | ORAL_TABLET | Freq: Every day | ORAL | Status: DC
  Administered 2015-08-22: 250 mg via ORAL
  Filled 2015-08-22: qty 1

## 2015-08-22 MED ORDER — LEVOFLOXACIN 250 MG PO TABS: 250.0000 mg | ORAL_TABLET | Freq: Every day | ORAL | 0 refills | Status: DC

## 2015-08-22 NOTE — Discharge Instructions (Signed)
HHPT °

## 2015-08-22 NOTE — Discharge Summary (Signed)
[place] at [place] NAME: Albert Boone    MR#:  [id number]  DATE OF BIRTH:  March 16, 1922  DATE OF ADMISSION:  08/20/2015 ADMITTING PHYSICIAN: Harrie Foreman, MD  DATE OF DISCHARGE: 08/22/15  PRIMARY CARE PHYSICIAN: Rusty Aus, MD    ADMISSION DIAGNOSIS:  Delirium [R41.0] CAP (community acquired pneumonia) [J18.9] Sepsis, due to unspecified organism [facility]) [A41.9]  DISCHARGE DIAGNOSIS:  Sepsis-resolved Left Ll pneumonia,community acquired  SECONDARY DIAGNOSIS:   Past Medical History:  Diagnosis Date  . GERD (gastroesophageal reflux disease)   . Hyperlipemia   . Hypertension   . Prostatitis   . Stroke [facility])    mini strokes    HOSPITAL COURSE:  82 year old male admitted for sepsis secondary to pneumonia. 1. Sepsis: came in with tachycardia and tachypnea. He is hemodynamically stable.  -Bc neg No fever Wbc 5.7 sats .93% on RA 2. Pneumonia: The patient has received aztreonam, Levaquin and vancomycin. -change to po levaquin (renal dosing)  3. Cerebrovascular disease: CT head negative. Continue Plavix.  4. Hyperlipidemia: Continue statin therapy  5. BPH: With urinary symptoms. Continue terazosin  6. DVT prophylaxis: Lovenox  7. GI prophylaxis: Continue PPI per home regimen  Overall stable d/c home with HHPT Spoke with son in the room  CONSULTS OBTAINED:    DRUG ALLERGIES:   Allergies  Allergen Reactions  . Penicillins Rash    DISCHARGE MEDICATIONS:   Current Discharge Medication List    START taking these medications   Details  levofloxacin (LEVAQUIN) 250 MG tablet Take 1 tablet (250 mg total) by mouth daily. Qty: 7 tablet, Refills: 0      CONTINUE these medications which have NOT CHANGED   Details  clopidogrel (PLAVIX) 75 MG tablet Take by mouth.    doxazosin (CARDURA) 2 MG tablet Take 2 mg by mouth daily.    Multiple Vitamins-Minerals (MULTIVITAMIN WITH MINERALS) tablet Take 1  tablet by mouth daily.    omeprazole (PRILOSEC) 20 MG capsule Take 20 mg by mouth daily.    simvastatin (ZOCOR) 20 MG tablet TAKE 1 TABLET BY MOUTH AT BEDTIME    terazosin (HYTRIN) 5 MG capsule Take 1 capsule (5 mg total) by mouth 2 (two) times daily. Qty: 30 capsule, Refills: 11   Associated Diagnoses: Urinary incontinence, unspecified incontinence type; Urinary frequency        If you experience worsening of your admission symptoms, develop shortness of breath, life threatening emergency, suicidal or homicidal thoughts you must seek medical attention immediately by calling 911 or calling your MD immediately  if symptoms less severe.  You Must read complete instructions/literature along with all the possible adverse reactions/side effects for all the Medicines you take and that have been prescribed to you. Take any new Medicines after you have completely understood and accept all the possible adverse reactions/side effects.   Please note  You were cared for by a hospitalist during your hospital stay. If you have any questions about your discharge medications or the care you received while you were in the hospital after you are discharged, you can call the unit and asked to speak with the hospitalist on call if the hospitalist that took care of you is not available. Once you are discharged, your primary care physician will handle any further medical issues. Please note that NO REFILLS for any discharge medications will be authorized once you are discharged, as it is imperative that you return to your primary care physician (or establish a  relationship with a primary care physician if you do not have one) for your aftercare needs so that they can reassess your need for medications and monitor your lab values. Today   SUBJECTIVE   Doing well. No new complaints No fever or cough  VITAL SIGNS:  Blood pressure (!) 131/59, pulse 60, temperature 98.4 F (36.9 C), temperature source Oral, resp.  rate 16, height 5\' 8"  (1.727 m), weight 77.4 kg (170 lb 10.2 oz), SpO2 93 %.  I/O:   Intake/Output Summary (Last 24 hours) at 08/22/15 0841 Last data filed at 08/22/15 0655  Gross per 24 hour  Intake          3705.83 ml  Output             1750 ml  Net          1955.83 ml    PHYSICAL EXAMINATION:  GENERAL:  82 y.o.-year-old patient lying in the bed with no acute distress.  EYES: Pupils equal, round, reactive to light and accommodation. No scleral icterus. Extraocular muscles intact.  HEENT: Head atraumatic, normocephalic. Oropharynx and nasopharynx clear.  NECK:  Supple, no jugular venous distention. No thyroid enlargement, no tenderness.  LUNGS: Normal breath sounds bilaterally, no wheezing No use of accessory muscles of respiration. Few crackles on the left lower lobe CARDIOVASCULAR: S1, S2 normal. No murmurs, rubs, or gallops.  ABDOMEN: Soft, non-tender, non-distended. Bowel sounds present. No organomegaly or mass.  EXTREMITIES: No pedal edema, cyanosis, or clubbing.  NEUROLOGIC: Cranial nerves II through XII are intact. Muscle strength 5/5 in all extremities. Sensation intact. Gait not checked.  PSYCHIATRIC: The patient is alert and oriented x 3.  SKIN: No obvious rash, lesion, or ulcer.   DATA REVIEW:   CBC   Recent Labs Lab 08/21/15 0006  WBC 5.7  HGB 14.3  HCT 42.2  PLT 141*    Chemistries   Recent Labs Lab 08/21/15 0006  NA 140  K 3.9  CL 107  CO2 21*  GLUCOSE 136*  BUN 18  CREATININE 1.42*  CALCIUM 9.0  AST 24  ALT 14*  ALKPHOS 53  BILITOT 0.3    Microbiology Results   Recent Results (from the past 240 hour(s))  Microscopic Examination     Status: Abnormal   Collection Time: 08/18/15  2:24 PM  Result Value Ref Range Status   WBC, UA 0-5 0 - 5 /hpf Final   RBC, UA 0-2 0 - 2 /hpf Final   Epithelial Cells (non renal) 0-10 0 - 10 /hpf Final   Casts Present (A) None seen /lpf Final   Cast Type Hyaline casts N/A Final   Mucus, UA Present (A) Not  Estab. Final   Bacteria, UA Few (A) None seen/Few Final  Blood Culture (routine x 2)     Status: None (Preliminary result)   Collection Time: 08/21/15 12:07 AM  Result Value Ref Range Status   Specimen Description BLOOD RIGHT ASSIST CONTROL  Final   Special Requests BOTTLES DRAWN AEROBIC AND ANAEROBIC 7ML  Final   Culture NO GROWTH < 12 HOURS  Final   Report Status PENDING  Incomplete  Blood Culture (routine x 2)     Status: None (Preliminary result)   Collection Time: 08/21/15 12:07 AM  Result Value Ref Range Status   Specimen Description BLOOD LEFT WRIST  Final   Special Requests BOTTLES DRAWN AEROBIC AND ANAEROBIC 5ML  Final   Culture NO GROWTH < 12 HOURS  Final   Report Status PENDING  Incomplete    RADIOLOGY:  Ct Head Wo Contrast  Result Date: 08/21/2015 CLINICAL DATA:  Acute onset of altered mental status. Bilateral weakness. High blood pressure and tachycardia. Initial encounter. EXAM: CT HEAD WITHOUT CONTRAST TECHNIQUE: Contiguous axial images were obtained from the base of the skull through the vertex without intravenous contrast. COMPARISON:  MRI of the brain performed 05/30/2013, and CT of the head performed 05/29/2013 FINDINGS: There is no evidence of acute infarction, mass lesion, or intra- or extra-axial hemorrhage on CT. Prominence of ventricles and sulci reflects mild cortical volume loss. Mild cerebellar atrophy is noted. Mild periventricular white matter change likely reflects small vessel ischemic microangiopathy. The brainstem and fourth ventricle are within normal limits. The basal ganglia are unremarkable in appearance. The cerebral hemispheres demonstrate grossly normal gray-white differentiation. No mass effect or midline shift is seen. There is no evidence of fracture; visualized osseous structures are unremarkable in appearance. The orbits are within normal limits. The paranasal sinuses and mastoid air cells are well-aerated. No significant soft tissue abnormalities are  seen. IMPRESSION: 1. No acute intracranial pathology seen on CT. 2. Mild cortical volume loss and scattered small vessel ischemic microangiopathy. Electronically Signed   By: Garald Balding M.D.   On: 08/21/2015 01:39   Dg Chest Port 1 View  Result Date: 08/21/2015 CLINICAL DATA:  Acute onset of generalized weakness. High blood pressure and tachycardia. Altered mental status. Initial encounter. EXAM: PORTABLE CHEST 1 VIEW COMPARISON:  Chest radiograph performed 10/31/2009 FINDINGS: Left basilar airspace opacity raises concern for pneumonia. The right lung appears relatively clear. No definite pleural effusion or pneumothorax is seen. The cardiomediastinal silhouette is borderline normal in size. No acute osseous abnormalities are identified. IMPRESSION: Left basilar airspace opacity raises concern for pneumonia. Electronically Signed   By: Garald Balding M.D.   On: 08/21/2015 00:54     Management plans discussed with the patient, family and they are in agreement.  CODE STATUS:     Code Status Orders        Start     Ordered   08/21/15 0425  Full code  Continuous     08/21/15 0424    Code Status History    Date Active Date Inactive Code Status Order ID Comments User Context   This patient has a current code status but no historical code status.    Advance Directive Documentation   Flowsheet Row Most Recent Value  Type of Advance Directive  Healthcare Power of Attorney  Pre-existing out of facility DNR order (yellow form or pink MOST form)  No data  "MOST" Form in Place?  No data      TOTAL TIME TAKING CARE OF THIS PATIENT: 40 minutes.    Lennyn Gange M.D on 08/22/2015 at 8:41 AM  Between 7am to 6pm - Pager - [phone] After 6pm go to [email or web address] - password [email or web address] [facility]  Office  [phone]  CC: Primary care physician; Rusty Aus, MD

## 2015-08-22 NOTE — Care Management Note (Signed)
Case Management Note  Patient Details  Name: Albert Boone MRN: [id number] Date of Birth: 1922/05/24  Subjective/Objective:      Spoke with daughter Lelon Frohlich [phone] who is making the decisions for patient with patient and pt spouses permission.   Family would like to go with [facility] for PT. Referral placed with Corliss Blacker.  [facility]. Patient will discharge to day with services. Family understands that there will be a co pay per visit and that services will start next week due to insurance Auth.           Action/Plan: Home with Home Health. Signed off.   Expected Discharge Date:  08/23/15               Expected Discharge Plan:  [place]  In-House Referral:     Discharge planning Services  CM Consult  Post Acute Care Choice:    Choice offered to:  Patient, Adult Children, Spouse  DME Arranged:  N/A DME Agency:     HH Arranged:  PT [place]:  [place] (now [facility])  Status of Service:  In process, will continue to follow  If discussed at Long Length of Stay Meetings, dates discussed:    Additional Comments:  Alvie Heidelberg, RN 08/22/2015, 9:37 AM

## 2015-08-22 NOTE — Discharge Planning (Signed)
Patient IV and tele removed.  Discharge papers given, explained and educated.  Informed of suggested FU appts and script for anbx sent to [facility].  RN assessment and VS revealed stability for DC to home with home health services.  Will be wheeled to front when ready and family transporting home via car.

## 2015-08-22 NOTE — Care Management Important Message (Signed)
Important Message  Patient Details  Name: Albert Boone MRN: [id number] Date of Birth: 1922-12-28   Medicare Important Message Given:  Yes    Alvie Heidelberg, RN 08/22/2015, 9:59 AM

## 2015-08-26 DIAGNOSIS — Z9181 History of falling: Secondary | ICD-10-CM | POA: Diagnosis not present

## 2015-08-26 DIAGNOSIS — J189 Pneumonia, unspecified organism: Secondary | ICD-10-CM | POA: Diagnosis not present

## 2015-08-26 DIAGNOSIS — R531 Weakness: Secondary | ICD-10-CM | POA: Diagnosis not present

## 2015-08-26 DIAGNOSIS — Z8673 Personal history of transient ischemic attack (TIA), and cerebral infarction without residual deficits: Secondary | ICD-10-CM | POA: Diagnosis not present

## 2015-08-26 DIAGNOSIS — Z792 Long term (current) use of antibiotics: Secondary | ICD-10-CM | POA: Diagnosis not present

## 2015-08-26 DIAGNOSIS — Z87891 Personal history of nicotine dependence: Secondary | ICD-10-CM | POA: Diagnosis not present

## 2015-08-26 DIAGNOSIS — I10 Essential (primary) hypertension: Secondary | ICD-10-CM | POA: Diagnosis not present

## 2015-08-26 DIAGNOSIS — E785 Hyperlipidemia, unspecified: Secondary | ICD-10-CM | POA: Diagnosis not present

## 2015-08-26 DIAGNOSIS — Z7902 Long term (current) use of antithrombotics/antiplatelets: Secondary | ICD-10-CM | POA: Diagnosis not present

## 2015-08-26 LAB — CULTURE, BLOOD (ROUTINE X 2)
CULTURE: NO GROWTH
Culture: NO GROWTH

## 2015-08-27 DIAGNOSIS — J13 Pneumonia due to Streptococcus pneumoniae: Secondary | ICD-10-CM | POA: Diagnosis not present

## 2015-09-02 DIAGNOSIS — E785 Hyperlipidemia, unspecified: Secondary | ICD-10-CM | POA: Diagnosis not present

## 2015-09-02 DIAGNOSIS — Z7902 Long term (current) use of antithrombotics/antiplatelets: Secondary | ICD-10-CM | POA: Diagnosis not present

## 2015-09-02 DIAGNOSIS — J189 Pneumonia, unspecified organism: Secondary | ICD-10-CM | POA: Diagnosis not present

## 2015-09-02 DIAGNOSIS — Z9181 History of falling: Secondary | ICD-10-CM | POA: Diagnosis not present

## 2015-09-02 DIAGNOSIS — Z87891 Personal history of nicotine dependence: Secondary | ICD-10-CM | POA: Diagnosis not present

## 2015-09-02 DIAGNOSIS — R531 Weakness: Secondary | ICD-10-CM | POA: Diagnosis not present

## 2015-09-02 DIAGNOSIS — I10 Essential (primary) hypertension: Secondary | ICD-10-CM | POA: Diagnosis not present

## 2015-09-02 DIAGNOSIS — Z8673 Personal history of transient ischemic attack (TIA), and cerebral infarction without residual deficits: Secondary | ICD-10-CM | POA: Diagnosis not present

## 2015-09-02 DIAGNOSIS — Z792 Long term (current) use of antibiotics: Secondary | ICD-10-CM | POA: Diagnosis not present

## 2015-09-08 DIAGNOSIS — J189 Pneumonia, unspecified organism: Secondary | ICD-10-CM | POA: Diagnosis not present

## 2015-09-08 DIAGNOSIS — Z9181 History of falling: Secondary | ICD-10-CM | POA: Diagnosis not present

## 2015-09-08 DIAGNOSIS — Z7902 Long term (current) use of antithrombotics/antiplatelets: Secondary | ICD-10-CM | POA: Diagnosis not present

## 2015-09-08 DIAGNOSIS — E785 Hyperlipidemia, unspecified: Secondary | ICD-10-CM | POA: Diagnosis not present

## 2015-09-08 DIAGNOSIS — I10 Essential (primary) hypertension: Secondary | ICD-10-CM | POA: Diagnosis not present

## 2015-09-08 DIAGNOSIS — Z8673 Personal history of transient ischemic attack (TIA), and cerebral infarction without residual deficits: Secondary | ICD-10-CM | POA: Diagnosis not present

## 2015-09-08 DIAGNOSIS — Z87891 Personal history of nicotine dependence: Secondary | ICD-10-CM | POA: Diagnosis not present

## 2015-09-08 DIAGNOSIS — R531 Weakness: Secondary | ICD-10-CM | POA: Diagnosis not present

## 2015-09-08 DIAGNOSIS — Z792 Long term (current) use of antibiotics: Secondary | ICD-10-CM | POA: Diagnosis not present

## 2015-09-10 DIAGNOSIS — Z125 Encounter for screening for malignant neoplasm of prostate: Secondary | ICD-10-CM | POA: Diagnosis not present

## 2015-09-10 DIAGNOSIS — N183 Chronic kidney disease, stage 3 (moderate): Secondary | ICD-10-CM | POA: Diagnosis not present

## 2015-09-16 DIAGNOSIS — Z8673 Personal history of transient ischemic attack (TIA), and cerebral infarction without residual deficits: Secondary | ICD-10-CM | POA: Diagnosis not present

## 2015-09-16 DIAGNOSIS — J189 Pneumonia, unspecified organism: Secondary | ICD-10-CM | POA: Diagnosis not present

## 2015-09-16 DIAGNOSIS — I10 Essential (primary) hypertension: Secondary | ICD-10-CM | POA: Diagnosis not present

## 2015-09-16 DIAGNOSIS — R531 Weakness: Secondary | ICD-10-CM | POA: Diagnosis not present

## 2015-09-16 DIAGNOSIS — Z87891 Personal history of nicotine dependence: Secondary | ICD-10-CM | POA: Diagnosis not present

## 2015-09-16 DIAGNOSIS — Z9181 History of falling: Secondary | ICD-10-CM | POA: Diagnosis not present

## 2015-09-16 DIAGNOSIS — Z792 Long term (current) use of antibiotics: Secondary | ICD-10-CM | POA: Diagnosis not present

## 2015-09-16 DIAGNOSIS — E785 Hyperlipidemia, unspecified: Secondary | ICD-10-CM | POA: Diagnosis not present

## 2015-09-16 DIAGNOSIS — Z7902 Long term (current) use of antithrombotics/antiplatelets: Secondary | ICD-10-CM | POA: Diagnosis not present

## 2015-09-17 DIAGNOSIS — J181 Lobar pneumonia, unspecified organism: Secondary | ICD-10-CM | POA: Diagnosis not present

## 2015-09-17 DIAGNOSIS — Z0000 Encounter for general adult medical examination without abnormal findings: Secondary | ICD-10-CM | POA: Diagnosis not present

## 2015-09-17 DIAGNOSIS — J13 Pneumonia due to Streptococcus pneumoniae: Secondary | ICD-10-CM | POA: Diagnosis not present

## 2015-09-23 DIAGNOSIS — Z8673 Personal history of transient ischemic attack (TIA), and cerebral infarction without residual deficits: Secondary | ICD-10-CM | POA: Diagnosis not present

## 2015-09-23 DIAGNOSIS — I10 Essential (primary) hypertension: Secondary | ICD-10-CM | POA: Diagnosis not present

## 2015-09-23 DIAGNOSIS — R531 Weakness: Secondary | ICD-10-CM | POA: Diagnosis not present

## 2015-09-23 DIAGNOSIS — J189 Pneumonia, unspecified organism: Secondary | ICD-10-CM | POA: Diagnosis not present

## 2015-09-23 DIAGNOSIS — Z7902 Long term (current) use of antithrombotics/antiplatelets: Secondary | ICD-10-CM | POA: Diagnosis not present

## 2015-09-23 DIAGNOSIS — Z792 Long term (current) use of antibiotics: Secondary | ICD-10-CM | POA: Diagnosis not present

## 2015-09-23 DIAGNOSIS — E785 Hyperlipidemia, unspecified: Secondary | ICD-10-CM | POA: Diagnosis not present

## 2015-09-23 DIAGNOSIS — Z87891 Personal history of nicotine dependence: Secondary | ICD-10-CM | POA: Diagnosis not present

## 2015-09-23 DIAGNOSIS — Z9181 History of falling: Secondary | ICD-10-CM | POA: Diagnosis not present

## 2015-09-26 ENCOUNTER — Encounter: Payer: Self-pay | Admitting: Urology

## 2015-09-26 ENCOUNTER — Ambulatory Visit (INDEPENDENT_AMBULATORY_CARE_PROVIDER_SITE_OTHER): Payer: PPO | Admitting: Urology

## 2015-09-26 VITALS — BP 122/68 | HR 103 | Ht 69.0 in | Wt 162.4 lb

## 2015-09-26 DIAGNOSIS — R350 Frequency of micturition: Secondary | ICD-10-CM

## 2015-09-26 NOTE — Progress Notes (Signed)
09/26/2015 3:14 PM   Albert Boone [birth year] [id number]  Referring provider: Rusty Aus, MD [place] [facility] [place], [place] [address]  Chief Complaint  Patient presents with  . Urinary Frequency    1 month follow up     HPI: Last visit For many months the patient has decreasing flow and frequency. He was put on terazosin and then doxazosin. The first drug worked much better and is back on it. He will take in the morning and his flow was excellent. At 2 in the afternoon his flow is slow. He voids every 2 hours both day and night. He may have had multiple TIAs in the past. He describes a cystoscopy many years ago. He has never had GU surgery is not get urinary tract infections  He is on 5 mg of terazosin without side effects  The patient bothered by his flow was slower in the afternoon. He had been tolerating the lower dosage so we increased it to 10 mg. Side effects of been discussed. The patient wanted to try one tablet in the morning 1 tablet night. Apparently he had pneumonia once the emergency room within 24 hours. The patient was found to have left lower lobe pneumonia. He had almost seizure like activity. He urine and blood cultures were negative  The patient has recuperated well from the seizure and/or pneumonia or both. He now takes terazosin at bedtime 1 tablet. He said it really improves his flow at night and he can tolerate the slower flow during the day and is very pleased  Clinically he's not infected.    PMH: Past Medical History:  Diagnosis Date  . GERD (gastroesophageal reflux disease)   . Hyperlipemia   . Hypertension   . Prostatitis   . Stroke [facility])    mini strokes    Surgical History: Past Surgical History:  Procedure Laterality Date  . SKIN CANCER EXCISION     multiple times    Home Medications:    Medication List       Accurate as of 09/26/15  3:14 PM. Always use your most recent med list.           clopidogrel 75 MG tablet Commonly known as:  PLAVIX Take by mouth.   doxazosin 2 MG tablet Commonly known as:  CARDURA Take 2 mg by mouth daily.   levofloxacin 250 MG tablet Commonly known as:  LEVAQUIN Take 1 tablet (250 mg total) by mouth daily.   multivitamin with minerals tablet Take 1 tablet by mouth daily.   omeprazole 20 MG capsule Commonly known as:  PRILOSEC Take 20 mg by mouth 2 (two) times daily.   simvastatin 20 MG tablet Commonly known as:  ZOCOR TAKE 1 TABLET BY MOUTH AT BEDTIME   terazosin 5 MG capsule Commonly known as:  HYTRIN Take 1 capsule (5 mg total) by mouth 2 (two) times daily.       Allergies:  Allergies  Allergen Reactions  . Penicillins Rash    Family History: Family History  Problem Relation Age of Onset  . Hypertension Father   . Prostate cancer Neg Hx   . Hematuria Neg Hx   . Kidney disease Neg Hx     Social History:  reports that he quit smoking about 49 years ago. He has never used smokeless tobacco. He reports that he drinks alcohol. He reports that he does not use drugs.  ROS: UROLOGY Frequent Urination?: Yes Hard to postpone  urination?: No Burning/pain with urination?: No Get up at night to urinate?: Yes Leakage of urine?: No Urine stream starts and stops?: No Trouble starting stream?: No Do you have to strain to urinate?: No Blood in urine?: No Urinary tract infection?: No Sexually transmitted disease?: No Injury to kidneys or bladder?: No Painful intercourse?: No Weak stream?: Yes Erection problems?: No Penile pain?: No  Gastrointestinal Nausea?: No Vomiting?: No Indigestion/heartburn?: No Diarrhea?: No Constipation?: No  Constitutional Fever: No Night sweats?: No Weight loss?: No Fatigue?: No  Skin Skin rash/lesions?: No Itching?: No  Eyes Blurred vision?: No Double vision?: No  Ears/Nose/Throat Sore throat?: No Sinus problems?: No  Hematologic/Lymphatic Swollen glands?:  No Easy bruising?: No  Cardiovascular Leg swelling?: No Chest pain?: No  Respiratory Cough?: No Shortness of breath?: No  Endocrine Excessive thirst?: No  Musculoskeletal Back pain?: No Joint pain?: No  Neurological Headaches?: No Dizziness?: No  Psychologic Depression?: No Anxiety?: No  Physical Exam: BP 122/68   Pulse (!) 103   Ht 5\' 9"  (1.753 m)   Wt 162 lb 6.4 oz (73.7 kg)   BMI 23.98 kg/m     Laboratory Data: Lab Results  Component Value Date   WBC 5.7 08/21/2015   HGB 14.3 08/21/2015   HCT 42.2 08/21/2015   MCV 88.1 08/21/2015   PLT 141 (L) 08/21/2015    Lab Results  Component Value Date   CREATININE 1.42 (H) 08/21/2015    No results found for: PSA  No results found for: TESTOSTERONE  Lab Results  Component Value Date   HGBA1C 5.8 08/21/2015    Urinalysis    Component Value Date/Time   COLORURINE YELLOW (A) 08/21/2015 0005   APPEARANCEUR CLEAR (A) 08/21/2015 0005   APPEARANCEUR Clear 08/18/2015 1424   LABSPEC 1.014 08/21/2015 0005   LABSPEC 1.013 05/29/2013 1904   PHURINE 5.0 08/21/2015 0005   GLUCOSEU NEGATIVE 08/21/2015 0005   GLUCOSEU Negative 05/29/2013 1904   HGBUR 1+ (A) 08/21/2015 0005   BILIRUBINUR NEGATIVE 08/21/2015 0005   BILIRUBINUR Negative 08/18/2015 1424   BILIRUBINUR Negative 05/29/2013 1904   KETONESUR NEGATIVE 08/21/2015 0005   PROTEINUR NEGATIVE 08/21/2015 0005   NITRITE NEGATIVE 08/21/2015 0005   LEUKOCYTESUR NEGATIVE 08/21/2015 0005   LEUKOCYTESUR Negative 08/18/2015 1424   LEUKOCYTESUR Negative 05/29/2013 1904    Pertinent Imaging: none  Assessment & Plan:  The patient has flow symptoms from BPH. He will stay on once a day traces and at bedtime. I will see him in one year  1. Urinary frequency 2. Lower urinary tract and thumb secondary to benign prostatic hyperplasia  - BLADDER SCAN AMB NON-IMAGING   No Follow-up on file.  Reece Packer, MD  [facility] [address] [place], [place] [address] [phone]

## 2015-11-10 DIAGNOSIS — C4402 Squamous cell carcinoma of skin of lip: Secondary | ICD-10-CM | POA: Diagnosis not present

## 2015-11-10 DIAGNOSIS — C44612 Basal cell carcinoma of skin of right upper limb, including shoulder: Secondary | ICD-10-CM | POA: Diagnosis not present

## 2015-11-14 DIAGNOSIS — L219 Seborrheic dermatitis, unspecified: Secondary | ICD-10-CM | POA: Diagnosis not present

## 2015-11-14 DIAGNOSIS — Z85820 Personal history of malignant melanoma of skin: Secondary | ICD-10-CM | POA: Diagnosis not present

## 2015-11-14 DIAGNOSIS — L821 Other seborrheic keratosis: Secondary | ICD-10-CM | POA: Diagnosis not present

## 2015-11-14 DIAGNOSIS — C44612 Basal cell carcinoma of skin of right upper limb, including shoulder: Secondary | ICD-10-CM | POA: Diagnosis not present

## 2015-11-14 DIAGNOSIS — Z85828 Personal history of other malignant neoplasm of skin: Secondary | ICD-10-CM | POA: Diagnosis not present

## 2015-11-14 DIAGNOSIS — L578 Other skin changes due to chronic exposure to nonionizing radiation: Secondary | ICD-10-CM | POA: Diagnosis not present

## 2015-11-14 DIAGNOSIS — L820 Inflamed seborrheic keratosis: Secondary | ICD-10-CM | POA: Diagnosis not present

## 2015-11-14 DIAGNOSIS — D1800 Hemangioma unspecified site: Secondary | ICD-10-CM | POA: Diagnosis not present

## 2015-11-14 DIAGNOSIS — C44319 Basal cell carcinoma of skin of other parts of face: Secondary | ICD-10-CM | POA: Diagnosis not present

## 2015-11-14 DIAGNOSIS — Z1283 Encounter for screening for malignant neoplasm of skin: Secondary | ICD-10-CM | POA: Diagnosis not present

## 2015-12-10 DIAGNOSIS — Z0000 Encounter for general adult medical examination without abnormal findings: Secondary | ICD-10-CM | POA: Diagnosis not present

## 2015-12-17 DIAGNOSIS — E782 Mixed hyperlipidemia: Secondary | ICD-10-CM | POA: Diagnosis not present

## 2015-12-17 DIAGNOSIS — G40109 Localization-related (focal) (partial) symptomatic epilepsy and epileptic syndromes with simple partial seizures, not intractable, without status epilepticus: Secondary | ICD-10-CM | POA: Diagnosis not present

## 2015-12-17 DIAGNOSIS — N183 Chronic kidney disease, stage 3 (moderate): Secondary | ICD-10-CM | POA: Diagnosis not present

## 2016-01-05 ENCOUNTER — Emergency Department: Payer: PPO

## 2016-01-05 ENCOUNTER — Observation Stay: Payer: PPO

## 2016-01-05 ENCOUNTER — Observation Stay
Admission: EM | Admit: 2016-01-05 | Discharge: 2016-01-06 | Disposition: A | Discharge status: Home / Self Care | Payer: PPO | Attending: Internal Medicine | Admitting: Internal Medicine

## 2016-01-05 DIAGNOSIS — I081 Rheumatic disorders of both mitral and tricuspid valves: Secondary | ICD-10-CM | POA: Diagnosis not present

## 2016-01-05 DIAGNOSIS — I6523 Occlusion and stenosis of bilateral carotid arteries: Secondary | ICD-10-CM | POA: Insufficient documentation

## 2016-01-05 DIAGNOSIS — Z880 Allergy status to penicillin: Secondary | ICD-10-CM | POA: Insufficient documentation

## 2016-01-05 DIAGNOSIS — I517 Cardiomegaly: Secondary | ICD-10-CM | POA: Diagnosis not present

## 2016-01-05 DIAGNOSIS — Z7902 Long term (current) use of antithrombotics/antiplatelets: Secondary | ICD-10-CM | POA: Diagnosis not present

## 2016-01-05 DIAGNOSIS — N183 Chronic kidney disease, stage 3 (moderate): Secondary | ICD-10-CM | POA: Insufficient documentation

## 2016-01-05 DIAGNOSIS — G459 Transient cerebral ischemic attack, unspecified: Secondary | ICD-10-CM | POA: Diagnosis not present

## 2016-01-05 DIAGNOSIS — I10 Essential (primary) hypertension: Secondary | ICD-10-CM | POA: Insufficient documentation

## 2016-01-05 DIAGNOSIS — M6281 Muscle weakness (generalized): Secondary | ICD-10-CM

## 2016-01-05 DIAGNOSIS — G4089 Other seizures: Secondary | ICD-10-CM | POA: Diagnosis not present

## 2016-01-05 DIAGNOSIS — I4510 Unspecified right bundle-branch block: Secondary | ICD-10-CM | POA: Insufficient documentation

## 2016-01-05 DIAGNOSIS — Z85828 Personal history of other malignant neoplasm of skin: Secondary | ICD-10-CM | POA: Insufficient documentation

## 2016-01-05 DIAGNOSIS — Z8249 Family history of ischemic heart disease and other diseases of the circulatory system: Secondary | ICD-10-CM | POA: Diagnosis not present

## 2016-01-05 DIAGNOSIS — R4182 Altered mental status, unspecified: Secondary | ICD-10-CM | POA: Diagnosis not present

## 2016-01-05 DIAGNOSIS — I129 Hypertensive chronic kidney disease with stage 1 through stage 4 chronic kidney disease, or unspecified chronic kidney disease: Secondary | ICD-10-CM | POA: Diagnosis not present

## 2016-01-05 DIAGNOSIS — Z8673 Personal history of transient ischemic attack (TIA), and cerebral infarction without residual deficits: Secondary | ICD-10-CM | POA: Diagnosis not present

## 2016-01-05 DIAGNOSIS — R0602 Shortness of breath: Secondary | ICD-10-CM | POA: Diagnosis not present

## 2016-01-05 DIAGNOSIS — E785 Hyperlipidemia, unspecified: Secondary | ICD-10-CM | POA: Diagnosis not present

## 2016-01-05 DIAGNOSIS — R2681 Unsteadiness on feet: Secondary | ICD-10-CM | POA: Diagnosis not present

## 2016-01-05 DIAGNOSIS — Z79899 Other long term (current) drug therapy: Secondary | ICD-10-CM | POA: Diagnosis not present

## 2016-01-05 DIAGNOSIS — K219 Gastro-esophageal reflux disease without esophagitis: Secondary | ICD-10-CM | POA: Insufficient documentation

## 2016-01-05 DIAGNOSIS — R569 Unspecified convulsions: Principal | ICD-10-CM

## 2016-01-05 DIAGNOSIS — Z87891 Personal history of nicotine dependence: Secondary | ICD-10-CM | POA: Diagnosis not present

## 2016-01-05 DIAGNOSIS — I6521 Occlusion and stenosis of right carotid artery: Secondary | ICD-10-CM | POA: Diagnosis not present

## 2016-01-05 LAB — CBC
HEMATOCRIT: 42.1 % (ref 40.0–52.0)
HEMOGLOBIN: 14.1 g/dL (ref 13.0–18.0)
MCH: 30.3 pg (ref 26.0–34.0)
MCHC: 33.6 g/dL (ref 32.0–36.0)
MCV: 90.3 fL (ref 80.0–100.0)
Platelets: 154 10*3/uL (ref 150–440)
RBC: 4.66 MIL/uL (ref 4.40–5.90)
RDW: 14.5 % (ref 11.5–14.5)
WBC: 5.4 10*3/uL (ref 3.8–10.6)

## 2016-01-05 LAB — LIPID PANEL
CHOL/HDL RATIO: 3.3 ratio
Cholesterol: 200 mg/dL (ref 0–200)
HDL: 60 mg/dL (ref >40)
LDL CALC: 124 mg/dL — AB (ref 0–99)
TRIGLYCERIDES: 81 mg/dL (ref <150)
VLDL: 16 mg/dL (ref 0–40)

## 2016-01-05 LAB — COMPREHENSIVE METABOLIC PANEL
ALBUMIN: 3.8 g/dL (ref 3.5–5.0)
ALT: 11 U/L — AB (ref 17–63)
AST: 23 U/L (ref 15–41)
Alkaline Phosphatase: 50 U/L (ref 38–126)
Anion gap: 9 (ref 5–15)
BILIRUBIN TOTAL: 0.5 mg/dL (ref 0.3–1.2)
BUN: 19 mg/dL (ref 6–20)
CHLORIDE: 109 mmol/L (ref 101–111)
CO2: 22 mmol/L (ref 22–32)
CREATININE: 1.38 mg/dL — AB (ref 0.61–1.24)
Calcium: 9 mg/dL (ref 8.9–10.3)
GFR calc Af Amer: 50 mL/min — ABNORMAL LOW (ref >60)
GFR calc non Af Amer: 43 mL/min — ABNORMAL LOW (ref >60)
GLUCOSE: 142 mg/dL — AB (ref 65–99)
POTASSIUM: 4 mmol/L (ref 3.5–5.1)
Sodium: 140 mmol/L (ref 135–145)
TOTAL PROTEIN: 7.2 g/dL (ref 6.5–8.1)

## 2016-01-05 LAB — DIFFERENTIAL
BASOS ABS: 0 10*3/uL (ref 0–0.1)
Basophils Relative: 1 %
EOS ABS: 0.1 10*3/uL (ref 0–0.7)
Eosinophils Relative: 3 %
LYMPHS ABS: 1.2 10*3/uL (ref 1.0–3.6)
LYMPHS PCT: 23 %
Monocytes Absolute: 0.5 10*3/uL (ref 0.2–1.0)
Monocytes Relative: 9 %
NEUTROS ABS: 3.5 10*3/uL (ref 1.4–6.5)
NEUTROS PCT: 64 %

## 2016-01-05 LAB — URINE DRUG SCREEN, QUALITATIVE (ARMC ONLY)
AMPHETAMINES, UR SCREEN: NOT DETECTED
BENZODIAZEPINE, UR SCRN: NOT DETECTED
Barbiturates, Ur Screen: NOT DETECTED
Cannabinoid 50 Ng, Ur ~~LOC~~: NOT DETECTED
Cocaine Metabolite,Ur ~~LOC~~: NOT DETECTED
MDMA (Ecstasy)Ur Screen: NOT DETECTED
Methadone Scn, Ur: NOT DETECTED
Opiate, Ur Screen: NOT DETECTED
PHENCYCLIDINE (PCP) UR S: NOT DETECTED
TRICYCLIC, UR SCREEN: NOT DETECTED

## 2016-01-05 LAB — URINALYSIS, ROUTINE W REFLEX MICROSCOPIC
Bilirubin Urine: NEGATIVE
GLUCOSE, UA: NEGATIVE mg/dL
HGB URINE DIPSTICK: NEGATIVE
Ketones, ur: NEGATIVE mg/dL
Leukocytes, UA: NEGATIVE
Nitrite: NEGATIVE
Protein, ur: NEGATIVE mg/dL
SPECIFIC GRAVITY, URINE: 1.014 (ref 1.005–1.030)
pH: 5 (ref 5.0–8.0)

## 2016-01-05 LAB — ETHANOL: Alcohol, Ethyl (B): <5 mg/dL (ref <5)

## 2016-01-05 LAB — PROTIME-INR
INR: 1.01
Prothrombin Time: 13.3 seconds (ref 11.4–15.2)

## 2016-01-05 LAB — TROPONIN I

## 2016-01-05 LAB — APTT: APTT: 29 s (ref 24–36)

## 2016-01-05 MED ORDER — ASPIRIN 81 MG PO CHEW
324.0000 mg | CHEWABLE_TABLET | Freq: Once | ORAL | Status: AC
  Administered 2016-01-05: 324 mg via ORAL
  Filled 2016-01-05: qty 4

## 2016-01-05 MED ORDER — LEVETIRACETAM 500 MG PO TABS
500.0000 mg | ORAL_TABLET | Freq: Two times a day (BID) | ORAL | Status: DC
  Administered 2016-01-05 – 2016-01-06 (×3): 500 mg via ORAL
  Filled 2016-01-05 (×3): qty 1

## 2016-01-05 MED ORDER — ATORVASTATIN CALCIUM 20 MG PO TABS
40.0000 mg | ORAL_TABLET | Freq: Every day | ORAL | Status: DC
  Administered 2016-01-05: 18:00:00 40 mg via ORAL
  Filled 2016-01-05: qty 2

## 2016-01-05 MED ORDER — PANTOPRAZOLE SODIUM 40 MG PO TBEC
40.0000 mg | DELAYED_RELEASE_TABLET | Freq: Every day | ORAL | Status: DC
  Administered 2016-01-05 – 2016-01-06 (×2): 40 mg via ORAL
  Filled 2016-01-05 (×2): qty 1

## 2016-01-05 MED ORDER — ENOXAPARIN SODIUM 40 MG/0.4ML ~~LOC~~ SOLN
40.0000 mg | SUBCUTANEOUS | Status: DC
  Administered 2016-01-05 – 2016-01-06 (×2): 40 mg via SUBCUTANEOUS
  Filled 2016-01-05 (×2): qty 0.4

## 2016-01-05 MED ORDER — SENNOSIDES-DOCUSATE SODIUM 8.6-50 MG PO TABS: 1.0000 | ORAL_TABLET | Freq: Every evening | ORAL | Status: DC | PRN

## 2016-01-05 MED ORDER — ACETAMINOPHEN 160 MG/5ML PO SOLN: 650.0000 mg | ORAL | Status: DC | PRN

## 2016-01-05 MED ORDER — ACETAMINOPHEN 325 MG PO TABS: 650.0000 mg | ORAL_TABLET | ORAL | Status: DC | PRN

## 2016-01-05 MED ORDER — ASPIRIN 300 MG RE SUPP: 300.0000 mg | Freq: Every day | RECTAL | Status: DC

## 2016-01-05 MED ORDER — CLOPIDOGREL BISULFATE 75 MG PO TABS
75.0000 mg | ORAL_TABLET | Freq: Every day | ORAL | Status: DC
  Administered 2016-01-05 – 2016-01-06 (×2): 75 mg via ORAL
  Filled 2016-01-05 (×2): qty 1

## 2016-01-05 MED ORDER — TERAZOSIN HCL 5 MG PO CAPS
5.0000 mg | ORAL_CAPSULE | Freq: Every day | ORAL | Status: DC
  Administered 2016-01-05 – 2016-01-06 (×2): 5 mg via ORAL
  Filled 2016-01-05 (×3): qty 1

## 2016-01-05 MED ORDER — ASPIRIN 325 MG PO TABS
325.0000 mg | ORAL_TABLET | Freq: Every day | ORAL | Status: DC
  Filled 2016-01-05: qty 1

## 2016-01-05 MED ORDER — ACETAMINOPHEN 650 MG RE SUPP: 650.0000 mg | RECTAL | Status: DC | PRN

## 2016-01-05 MED ORDER — STROKE: EARLY STAGES OF RECOVERY BOOK
Freq: Once | Status: AC
  Administered 2016-01-05: 06:00:00

## 2016-01-05 MED ORDER — GADOBENATE DIMEGLUMINE 529 MG/ML IV SOLN
10.0000 mL | Freq: Once | INTRAVENOUS | Status: AC | PRN
  Administered 2016-01-05: 7 mL via INTRAVENOUS

## 2016-01-05 MED ORDER — ADULT MULTIVITAMIN W/MINERALS CH
1.0000 | ORAL_TABLET | Freq: Every day | ORAL | Status: DC
  Administered 2016-01-05 – 2016-01-06 (×2): 1 via ORAL
  Filled 2016-01-05 (×2): qty 1

## 2016-01-05 MED ORDER — SODIUM CHLORIDE 0.9 % IV BOLUS (SEPSIS)
500.0000 mL | Freq: Once | INTRAVENOUS | Status: AC
  Administered 2016-01-05: 500 mL via INTRAVENOUS

## 2016-01-05 MED ORDER — INFLUENZA VAC SPLIT QUAD 0.5 ML IM SUSY
0.5000 mL | PREFILLED_SYRINGE | INTRAMUSCULAR | Status: AC
  Administered 2016-01-06: 13:00:00 0.5 mL via INTRAMUSCULAR
  Filled 2016-01-05: qty 0.5

## 2016-01-05 MED ORDER — ASPIRIN 325 MG PO TABS
325.0000 mg | ORAL_TABLET | Freq: Every day | ORAL | Status: DC
  Administered 2016-01-06: 325 mg via ORAL
  Filled 2016-01-05: qty 1

## 2016-01-05 NOTE — Consult Note (Addendum)
Reason for Consult:confusion and seizure activity  Referring Physician: Dr. Bridgett Larsson   CC: confusion/seizure  HPI: Albert Boone is an 82 y.o. male with a known history of GERD, hyperlipidemia, CVA, hypertension, prostatitis presented to the emergency room for evaluation for generalized body shaking. Patient's wife woke up around 1:45 AM and noticed that patient was having generalized tonic-clonic convulsion. Patient had history of seizure in the past. Pt does not recall event and currently back to his normal self.    Past Medical History:  Diagnosis Date  . GERD (gastroesophageal reflux disease)   . Hyperlipemia   . Hypertension   . Prostatitis   . Stroke [facility])    mini strokes    Past Surgical History:  Procedure Laterality Date  . SKIN CANCER EXCISION     multiple times    Family History  Problem Relation Age of Onset  . Hypertension Father   . Prostate cancer Neg Hx   . Hematuria Neg Hx   . Kidney disease Neg Hx     Social History:  reports that he quit smoking about 49 years ago. His smoking use included Cigarettes. He has never used smokeless tobacco. He reports that he drinks about 2.4 oz of alcohol per week . He reports that he does not use drugs.  Allergies  Allergen Reactions  . Penicillins Rash    Has patient had a PCN reaction causing immediate rash, facial/tongue/throat swelling, SOB or lightheadedness with hypotension: Yes Has patient had a PCN reaction causing severe rash involving mucus membranes or skin necrosis: No Has patient had a PCN reaction that required hospitalization No Has patient had a PCN reaction occurring within the last 10 years: No If all of the above answers are "NO", then may proceed with Cephalosporin use.     Medications: I have reviewed the patient's current medications.  ROS: History obtained from the patient  General ROS: negative for - chills, fatigue, fever, night sweats, weight gain or weight loss Psychological ROS:  negative for - behavioral disorder, hallucinations, memory difficulties, mood swings or suicidal ideation Ophthalmic ROS: negative for - blurry vision, double vision, eye pain or loss of vision ENT ROS: negative for - epistaxis, nasal discharge, oral lesions, sore throat, tinnitus or vertigo Allergy and Immunology ROS: negative for - hives or itchy/watery eyes Hematological and Lymphatic ROS: negative for - bleeding problems, bruising or swollen lymph nodes Endocrine ROS: negative for - galactorrhea, hair pattern changes, polydipsia/polyuria or temperature intolerance Respiratory ROS: negative for - cough, hemoptysis, shortness of breath or wheezing Cardiovascular ROS: negative for - chest pain, dyspnea on exertion, edema or irregular heartbeat Gastrointestinal ROS: negative for - abdominal pain, diarrhea, hematemesis, nausea/vomiting or stool incontinence Genito-Urinary ROS: negative for - dysuria, hematuria, incontinence or urinary frequency/urgency Musculoskeletal ROS: negative for - joint swelling or muscular weakness Neurological ROS: as noted in HPI Dermatological ROS: negative for rash and skin lesion changes  Physical Examination: Blood pressure 138/64, pulse 72, temperature 97.8 F (36.6 C), temperature source Oral, resp. rate 18, height 5\' 9"  (1.753 m), weight 74 kg (163 lb 3.2 oz), SpO2 95 %.   Neurological Examination Mental Status: Alert, oriented, thought content appropriate.  Speech fluent without evidence of aphasia.  Able to follow 3 step commands without difficulty. Cranial Nerves: II: Discs flat bilaterally; Visual fields grossly normal, pupils equal, round, reactive to light and accommodation Boone,IV, VI: ptosis not present, extra-ocular motions intact bilaterally V,VII: smile symmetric, facial light touch sensation normal bilaterally VIII: hearing normal  bilaterally IX,X: gag reflex present XI: bilateral shoulder shrug XII: midline tongue extension Motor: Right  : Upper extremity   5/5    Left:     Upper extremity   5/5  Lower extremity   5/5     Lower extremity   5/5 Tone and bulk:normal tone throughout; no atrophy noted Sensory: Pinprick and light touch intact throughout, bilaterally Deep Tendon Reflexes: 1+ and symmetric throughout Plantars: Right: downgoing   Left: downgoing Cerebellar: normal finger-to-nose, normal rapid alternating movements and normal heel-to-shin test Gait: not tested      Laboratory Studies:   Basic Metabolic Panel:  Recent Labs Lab 01/05/16 0229  NA 140  K 4.0  CL 109  CO2 22  GLUCOSE 142*  BUN 19  CREATININE 1.38*  CALCIUM 9.0    Liver Function Tests:  Recent Labs Lab 01/05/16 0229  AST 23  ALT 11*  ALKPHOS 50  BILITOT 0.5  PROT 7.2  ALBUMIN 3.8   No results for input(s): LIPASE, AMYLASE in the last 168 hours. No results for input(s): AMMONIA in the last 168 hours.  CBC:  Recent Labs Lab 01/05/16 0229  WBC 5.4  NEUTROABS 3.5  HGB 14.1  HCT 42.1  MCV 90.3  PLT 154    Cardiac Enzymes:  Recent Labs Lab 01/05/16 0229  TROPONINI <0.03    BNP: Invalid input(s): POCBNP  CBG: No results for input(s): GLUCAP in the last 168 hours.  Microbiology: Results for orders placed or performed during the hospital encounter of 08/20/15  Urine culture     Status: None   Collection Time: 08/21/15 12:05 AM  Result Value Ref Range Status   Specimen Description URINE, CLEAN CATCH  Final   Special Requests NONE  Final   Culture NO GROWTH Performed at [facility]   Final   Report Status 08/22/2015 FINAL  Final  Blood Culture (routine x 2)     Status: None   Collection Time: 08/21/15 12:07 AM  Result Value Ref Range Status   Specimen Description BLOOD RIGHT ASSIST CONTROL  Final   Special Requests BOTTLES DRAWN AEROBIC AND ANAEROBIC 7ML  Final   Culture NO GROWTH 5 DAYS  Final   Report Status 08/26/2015 FINAL  Final  Blood Culture (routine x 2)     Status: None   Collection  Time: 08/21/15 12:07 AM  Result Value Ref Range Status   Specimen Description BLOOD LEFT WRIST  Final   Special Requests BOTTLES DRAWN AEROBIC AND ANAEROBIC 5ML  Final   Culture NO GROWTH 5 DAYS  Final   Report Status 08/26/2015 FINAL  Final    Coagulation Studies:  Recent Labs  01/05/16 0229  LABPROT 13.3  INR 1.01    Urinalysis:  Recent Labs Lab 01/05/16 0349  COLORURINE YELLOW*  LABSPEC 1.014  PHURINE 5.0  GLUCOSEU NEGATIVE  HGBUR NEGATIVE  BILIRUBINUR NEGATIVE  KETONESUR NEGATIVE  PROTEINUR NEGATIVE  NITRITE NEGATIVE  LEUKOCYTESUR NEGATIVE    Lipid Panel:     Component Value Date/Time   CHOL 200 01/05/2016 0229   CHOL 128 05/30/2013 0326   TRIG 81 01/05/2016 0229   TRIG 137 05/30/2013 0326   HDL 60 01/05/2016 0229   HDL 43 05/30/2013 0326   CHOLHDL 3.3 01/05/2016 0229   VLDL 16 01/05/2016 0229   VLDL 27 05/30/2013 0326   LDLCALC 124 (H) 01/05/2016 0229   LDLCALC 58 05/30/2013 0326    HgbA1C:  Lab Results  Component Value Date   HGBA1C 5.8  08/21/2015    Urine Drug Screen:     Component Value Date/Time   LABOPIA NONE DETECTED 01/05/2016 0349   COCAINSCRNUR NONE DETECTED 01/05/2016 0349   LABBENZ NONE DETECTED 01/05/2016 0349   AMPHETMU NONE DETECTED 01/05/2016 0349   THCU NONE DETECTED 01/05/2016 0349   LABBARB NONE DETECTED 01/05/2016 0349    Alcohol Level:  Recent Labs Lab 01/05/16 0229  ETH <5     Imaging: Ct Head Wo Contrast  Result Date: 01/05/2016 CLINICAL DATA:  Altered mental status EXAM: CT HEAD WITHOUT CONTRAST TECHNIQUE: Contiguous axial images were obtained from the base of the skull through the vertex without intravenous contrast. COMPARISON:  Head CT 08/21/2015 FINDINGS: Brain: No mass lesion, intraparenchymal hemorrhage or extra-axial collection. No evidence of acute cortical infarct. There is periventricular hypoattenuation compatible with chronic microvascular disease. Vascular: Atherosclerotic calcification of the  internal carotid arteries at the skullbase. Skull: Normal visualized skull base, calvarium and extracranial soft tissues. Sinuses/Orbits: No sinus fluid levels or advanced mucosal thickening. No mastoid effusion. Normal orbits. IMPRESSION: 1. No acute intracranial abnormality. 2. Chronic microvascular ischemia. Electronically Signed   By: Ulyses Jarred M.D.   On: 01/05/2016 02:55     Assessment/Plan:  82 y.o. male with a known history of GERD, hyperlipidemia, CVA, hypertension, prostatitis presented to the emergency room for evaluation for generalized body shaking. Patient's wife woke up around 1:45 AM and noticed that patient was having generalized tonic-clonic convulsion. Patient had history of seizure in the past. Pt does not recall event and currently back to his normal self.  - unclear of true seizure episode, but pt does not remember the event - this is not be first episode. Will start pt on Keppra 500 BID - MRI brain today - If MRI brain no abnormalities d/c planning today on Keppra 500 BID - f/up as out pt Leotis Pain   01/05/2016, 12:31 PM    Addendum:  MRI brain reviewed, no acute abnormalities Poor quality MRA US carotid no acute abnormalities D/c planning  F/up neurology as out pt.

## 2016-01-05 NOTE — ED Notes (Signed)
Patient is now oriented X 4, answering all questions appropriately - appear to have no memory impairment at this time

## 2016-01-05 NOTE — ED Notes (Signed)
Patients family would like to be called for any changes in patient status; patient gave consent.  Wife - Elton Runkles [phone] [place] [phone]

## 2016-01-05 NOTE — ED Notes (Signed)
Patient's wife reports patient went to bed (last know well) at 2100 on 12/24. Pt has previous hx of stroke in August - patient had seizures along with stroke.   Pt's wife reports that she was in bed and felt the bed begin to shake, and saw her husband seizing at approx 0200 today. She reports that her husband then had white foamy discharge from his mouth. Patient was nonverbal, however would track her with his eyes. Pt was still nonverbal when EMS left her home.

## 2016-01-05 NOTE — ED Notes (Signed)
Code Stroke called to [place], [facility] called

## 2016-01-05 NOTE — Progress Notes (Signed)
The patient has no complaints. Vital signs reviewed, the patient was examined. 82 year old male patient with history of GERD, hypertension and hyperlipidemia, CVA and prostatitis presented to the emergency room after having an episode of convulsion. Admitting diagnosis 1. Seizure versus transient ischemic attack Seizure, aspiration and fall precaution. Seizure medication per Dr. Macie Burows. MRI and MRA brain, echocardiogram and carotid duplex, PT and OT evaluation, follow-up neurologists recommendation. Continue aspirin, plavix and statin.  2. hypertension. Controlled without hypertension medication.  I discussed with neurologist, Dr. Macie Burows. Discussed with patient's wife.

## 2016-01-05 NOTE — ED Triage Notes (Signed)
Per EMS: Patient's wife called EMS and reported patient awoke and was altered. Pt has hx of seizures and previous stroke.

## 2016-01-05 NOTE — H&P (Signed)
[facility] [place] NAME: Albert Boone    MR#:  [id number]  DATE OF BIRTH:  1922-06-29  DATE OF ADMISSION:  01/05/2016  PRIMARY CARE PHYSICIAN: Rusty Aus, MD   REQUESTING/REFERRING PHYSICIAN:   CHIEF COMPLAINT:   Chief Complaint  Patient presents with  . Code Stroke    HISTORY OF PRESENT ILLNESS: Albert Boone  is a 82 y.o. male with a known history of GERD, hyperlipidemia, CVA, hypertension, prostatitis presented to the emergency room for evaluation for generalized body shaking. Patient's wife woke up around 1:45 AM and noticed that patient was having generalized tonic-clonic convulsion. Patient had history of seizure in the past. According to patient and wife it looked like again and use seizure. No numbness or tingling sensation in any part of the body. No complaints of any slurred speech by the patient. Patient was evaluated for stroke in the emergency room CT head did not show any acute intracranial abnormality. Telemetry neurology consultation was done who recommended MRI brain and carotid ultrasound. Not much history could be obtained from the patient because he does not remember the sequence of events. Most of the history obtained from patient's wife at bedside. No history of any fall or head injury. No fever or chills or cough. Patient is independent in activities of daily living and still pretty active and drives.  PAST MEDICAL HISTORY:   Past Medical History:  Diagnosis Date  . GERD (gastroesophageal reflux disease)   . Hyperlipemia   . Hypertension   . Prostatitis   . Stroke [facility])    mini strokes    PAST SURGICAL HISTORY: Past Surgical History:  Procedure Laterality Date  . SKIN CANCER EXCISION     multiple times    SOCIAL HISTORY:  Social History  Substance Use Topics  . Smoking status: Former Smoker    Quit date: 08/18/1966  . Smokeless tobacco: Never Used  . Alcohol use Yes    FAMILY HISTORY:   Family History  Problem Relation Age of Onset  . Hypertension Father   . Prostate cancer Neg Hx   . Hematuria Neg Hx   . Kidney disease Neg Hx     DRUG ALLERGIES:  Allergies  Allergen Reactions  . Penicillins Rash    Has patient had a PCN reaction causing immediate rash, facial/tongue/throat swelling, SOB or lightheadedness with hypotension: Yes Has patient had a PCN reaction causing severe rash involving mucus membranes or skin necrosis: No Has patient had a PCN reaction that required hospitalization No Has patient had a PCN reaction occurring within the last 10 years: No If all of the above answers are "NO", then may proceed with Cephalosporin use.     REVIEW OF SYSTEMS:   CONSTITUTIONAL: No fever, fatigue or weakness.  EYES: No blurred or double vision.  EARS, NOSE, AND THROAT: No tinnitus or ear pain.  RESPIRATORY: No cough, shortness of breath, wheezing or hemoptysis.  CARDIOVASCULAR: No chest pain, orthopnea, edema.  GASTROINTESTINAL: No nausea, vomiting, diarrhea or abdominal pain.  GENITOURINARY: No dysuria, hematuria.  ENDOCRINE: No polyuria, nocturia,  HEMATOLOGY: No anemia, easy bruising or bleeding SKIN: No rash or lesion. MUSCULOSKELETAL: No joint pain or arthritis.   NEUROLOGIC: No tingling, numbness, weakness.  Had an episode of generalized tonic clonic convulsion according to family PSYCHIATRY: No anxiety or depression.   MEDICATIONS AT HOME:  Prior to Admission medications   Medication Sig Start Date End Date Taking? Authorizing Provider  clopidogrel (PLAVIX)  75 MG tablet Take by mouth. 01/24/15 01/24/16 Yes Historical Provider, MD  Multiple Vitamins-Minerals (MULTIVITAMIN WITH MINERALS) tablet Take 1 tablet by mouth daily.   Yes Historical Provider, MD  omeprazole (PRILOSEC) 20 MG capsule Take 20 mg by mouth 2 (two) times daily.    Yes Historical Provider, MD  terazosin (HYTRIN) 5 MG capsule Take 1 capsule (5 mg total) by mouth 2 (two) times  daily. Patient taking differently: Take 5 mg by mouth daily.  08/18/15  Yes Bjorn Loser, MD  levofloxacin (LEVAQUIN) 250 MG tablet Take 1 tablet (250 mg total) by mouth daily. Patient not taking: Reported on 01/05/2016 08/22/15   Fritzi Mandes, MD      PHYSICAL EXAMINATION:   VITAL SIGNS: Blood pressure (!) 147/85, pulse 98, temperature 97.8 F (36.6 C), resp. rate (!) 26, height 6' (1.829 m), weight 73.8 kg (162 lb 12.8 oz), SpO2 91 %.  GENERAL:  82 y.o.-year-old patient lying in the bed with no acute distress.  EYES: Pupils equal, round, reactive to light and accommodation. No scleral icterus. Extraocular muscles intact.  HEENT: Head atraumatic, normocephalic. Oropharynx and nasopharynx clear.  NECK:  Supple, no jugular venous distention. No thyroid enlargement, no tenderness.  LUNGS: Normal breath sounds bilaterally, no wheezing, rales,rhonchi or crepitation. No use of accessory muscles of respiration.  CARDIOVASCULAR: S1, S2 normal. No murmurs, rubs, or gallops.  ABDOMEN: Soft, nontender, nondistended. Bowel sounds present. No organomegaly or mass.  EXTREMITIES: No pedal edema, cyanosis, or clubbing.  NEUROLOGIC: Cranial nerves II through XII are intact. Muscle strength 5/5 in all extremities. Sensation intact. Gait not checked.  No facial droop PSYCHIATRIC: The patient is alert and oriented x 3.  SKIN: No obvious rash, lesion, or ulcer.   LABORATORY PANEL:   CBC  Recent Labs Lab 01/05/16 0229  WBC 5.4  HGB 14.1  HCT 42.1  PLT 154  MCV 90.3  MCH 30.3  MCHC 33.6  RDW 14.5  LYMPHSABS 1.2  MONOABS 0.5  EOSABS 0.1  BASOSABS 0.0   ------------------------------------------------------------------------------------------------------------------  Chemistries   Recent Labs Lab 01/05/16 0229  NA 140  K 4.0  CL 109  CO2 22  GLUCOSE 142*  BUN 19  CREATININE 1.38*  CALCIUM 9.0  AST 23  ALT 11*  ALKPHOS 50  BILITOT 0.5    ------------------------------------------------------------------------------------------------------------------ estimated creatinine clearance is 36.4 mL/min (by C-G formula based on SCr of 1.38 mg/dL (H)). ------------------------------------------------------------------------------------------------------------------ No results for input(s): TSH, T4TOTAL, T3FREE, THYROIDAB in the last 72 hours.  Invalid input(s): FREET3   Coagulation profile  Recent Labs Lab 01/05/16 0229  INR 1.01   ------------------------------------------------------------------------------------------------------------------- No results for input(s): DDIMER in the last 72 hours. -------------------------------------------------------------------------------------------------------------------  Cardiac Enzymes  Recent Labs Lab 01/05/16 0229  TROPONINI <0.03   ------------------------------------------------------------------------------------------------------------------ Invalid input(s): POCBNP  ---------------------------------------------------------------------------------------------------------------  Urinalysis    Component Value Date/Time   COLORURINE YELLOW (A) 01/05/2016 0349   APPEARANCEUR CLEAR (A) 01/05/2016 0349   APPEARANCEUR Clear 08/18/2015 1424   LABSPEC 1.014 01/05/2016 0349   LABSPEC 1.013 05/29/2013 1904   PHURINE 5.0 01/05/2016 0349   GLUCOSEU NEGATIVE 01/05/2016 0349   GLUCOSEU Negative 05/29/2013 1904   HGBUR NEGATIVE 01/05/2016 0349   BILIRUBINUR NEGATIVE 01/05/2016 0349   BILIRUBINUR Negative 08/18/2015 1424   BILIRUBINUR Negative 05/29/2013 [place] NEGATIVE 01/05/2016 0349   PROTEINUR NEGATIVE 01/05/2016 0349   NITRITE NEGATIVE 01/05/2016 0349   LEUKOCYTESUR NEGATIVE 01/05/2016 0349   LEUKOCYTESUR Negative 08/18/2015 1424   LEUKOCYTESUR Negative 05/29/2013 1904  RADIOLOGY: Ct Head Wo Contrast  Result Date: 01/05/2016 CLINICAL DATA:   Altered mental status EXAM: CT HEAD WITHOUT CONTRAST TECHNIQUE: Contiguous axial images were obtained from the base of the skull through the vertex without intravenous contrast. COMPARISON:  Head CT 08/21/2015 FINDINGS: Brain: No mass lesion, intraparenchymal hemorrhage or extra-axial collection. No evidence of acute cortical infarct. There is periventricular hypoattenuation compatible with chronic microvascular disease. Vascular: Atherosclerotic calcification of the internal carotid arteries at the skullbase. Skull: Normal visualized skull base, calvarium and extracranial soft tissues. Sinuses/Orbits: No sinus fluid levels or advanced mucosal thickening. No mastoid effusion. Normal orbits. IMPRESSION: 1. No acute intracranial abnormality. 2. Chronic microvascular ischemia. Electronically Signed   By: Ulyses Jarred M.D.   On: 01/05/2016 02:55    EKG: Orders placed or performed during the hospital encounter of 01/05/16  . ED EKG  . ED EKG  . EKG 12-Lead  . EKG 12-Lead    IMPRESSION AND PLAN: 82 year old male patient with history of GERD, hypertension and hyperlipidemia, CVA and prostatitis presented to the emergency room after having an episode of convulsion. Admitting diagnosis 1. Seizure versus transient ischemic attack 2. GERD 3. Hypertension 4. Hyperlipidemia Treatment plan Admit patient to observation bed MRI and MRA brain to assess for any CVA Carotid ultrasound to rule out obstruction Monitor for any new seizures Check echocardiogram Start patient on aspirin Neurology consultation Supportive care. All the records are reviewed and case discussed with ED provider. Management plans discussed with the patient, family and they are in agreement.  CODE STATUS:FULL CODE Code Status History    Date Active Date Inactive Code Status Order ID Comments User Context   08/21/2015  4:24 AM 08/22/2015  4:26 PM Full Code [id number]  Harrie Foreman, MD Inpatient       TOTAL TIME TAKING CARE  OF THIS PATIENT: 50 minutes.    Saundra Shelling M.D on 01/05/2016 at 4:42 AM  Between 7am to 6pm - Pager - [phone]  After 6pm go to [email or web address] - password [email or web address] [facility]  Office  [phone]  CC: Primary care physician; Rusty Aus, MD

## 2016-01-05 NOTE — ED Notes (Signed)
Patient transported to CT 

## 2016-01-06 ENCOUNTER — Observation Stay
Admit: 2016-01-06 | Discharge: 2016-01-06 | Disposition: A | Discharge status: Home / Self Care | Payer: PPO | Attending: Internal Medicine | Admitting: Internal Medicine

## 2016-01-06 DIAGNOSIS — R569 Unspecified convulsions: Secondary | ICD-10-CM | POA: Diagnosis not present

## 2016-01-06 DIAGNOSIS — K219 Gastro-esophageal reflux disease without esophagitis: Secondary | ICD-10-CM | POA: Diagnosis not present

## 2016-01-06 DIAGNOSIS — G459 Transient cerebral ischemic attack, unspecified: Secondary | ICD-10-CM | POA: Diagnosis not present

## 2016-01-06 DIAGNOSIS — I10 Essential (primary) hypertension: Secondary | ICD-10-CM | POA: Diagnosis not present

## 2016-01-06 LAB — ECHOCARDIOGRAM COMPLETE
Height: 69 in
Weight: 2611.2 oz

## 2016-01-06 MED ORDER — LEVETIRACETAM 500 MG PO TABS: 500.0000 mg | ORAL_TABLET | Freq: Two times a day (BID) | ORAL | 0 refills | Status: DC

## 2016-01-06 NOTE — Consult Note (Signed)
Reason for Consult:confusion and seizure activity  Referring Physician: Dr. Bridgett Larsson   CC: confusion/seizure  HPI: Albert Boone is an 82 y.o. male with a known history of GERD, hyperlipidemia, CVA, hypertension, prostatitis presented to the emergency room for evaluation for generalized body shaking. Patient's wife woke up around 1:45 AM and noticed that patient was having generalized tonic-clonic convulsion. Patient had history of seizure in the past. Pt does not recall event and currently back to his normal self.    Past Medical History:  Diagnosis Date  . GERD (gastroesophageal reflux disease)   . Hyperlipemia   . Hypertension   . Prostatitis   . Stroke [facility])    mini strokes    Past Surgical History:  Procedure Laterality Date  . SKIN CANCER EXCISION     multiple times    Family History  Problem Relation Age of Onset  . Hypertension Father   . Prostate cancer Neg Hx   . Hematuria Neg Hx   . Kidney disease Neg Hx     Social History:  reports that he quit smoking about 49 years ago. His smoking use included Cigarettes. He has never used smokeless tobacco. He reports that he drinks about 2.4 oz of alcohol per week . He reports that he does not use drugs.  Allergies  Allergen Reactions  . Penicillins Rash    Has patient had a PCN reaction causing immediate rash, facial/tongue/throat swelling, SOB or lightheadedness with hypotension: Yes Has patient had a PCN reaction causing severe rash involving mucus membranes or skin necrosis: No Has patient had a PCN reaction that required hospitalization No Has patient had a PCN reaction occurring within the last 10 years: No If all of the above answers are "NO", then may proceed with Cephalosporin use.     Medications: I have reviewed the patient's current medications.  ROS: History obtained from the patient  General ROS: negative for - chills, fatigue, fever, night sweats, weight gain or weight loss Psychological ROS:  negative for - behavioral disorder, hallucinations, memory difficulties, mood swings or suicidal ideation Ophthalmic ROS: negative for - blurry vision, double vision, eye pain or loss of vision ENT ROS: negative for - epistaxis, nasal discharge, oral lesions, sore throat, tinnitus or vertigo Allergy and Immunology ROS: negative for - hives or itchy/watery eyes Hematological and Lymphatic ROS: negative for - bleeding problems, bruising or swollen lymph nodes Endocrine ROS: negative for - galactorrhea, hair pattern changes, polydipsia/polyuria or temperature intolerance Respiratory ROS: negative for - cough, hemoptysis, shortness of breath or wheezing Cardiovascular ROS: negative for - chest pain, dyspnea on exertion, edema or irregular heartbeat Gastrointestinal ROS: negative for - abdominal pain, diarrhea, hematemesis, nausea/vomiting or stool incontinence Genito-Urinary ROS: negative for - dysuria, hematuria, incontinence or urinary frequency/urgency Musculoskeletal ROS: negative for - joint swelling or muscular weakness Neurological ROS: as noted in HPI Dermatological ROS: negative for rash and skin lesion changes  Physical Examination: Blood pressure 140/81, pulse 68, temperature 97.6 F (36.4 C), temperature source Oral, resp. rate 16, height 5\' 9"  (1.753 m), weight 74 kg (163 lb 3.2 oz), SpO2 96 %.   Neurological Examination Mental Status: Alert, oriented, thought content appropriate.  Speech fluent without evidence of aphasia.  Able to follow 3 step commands without difficulty. Cranial Nerves: II: Discs flat bilaterally; Visual fields grossly normal, pupils equal, round, reactive to light and accommodation Boone,IV, VI: ptosis not present, extra-ocular motions intact bilaterally V,VII: smile symmetric, facial light touch sensation normal bilaterally VIII: hearing normal  bilaterally IX,X: gag reflex present XI: bilateral shoulder shrug XII: midline tongue extension Motor: Right  : Upper extremity   5/5    Left:     Upper extremity   5/5  Lower extremity   5/5     Lower extremity   5/5 Tone and bulk:normal tone throughout; no atrophy noted Sensory: Pinprick and light touch intact throughout, bilaterally Deep Tendon Reflexes: 1+ and symmetric throughout Plantars: Right: downgoing   Left: downgoing Cerebellar: normal finger-to-nose, normal rapid alternating movements and normal heel-to-shin test Gait: not tested      Laboratory Studies:   Basic Metabolic Panel:  Recent Labs Lab 01/05/16 0229  NA 140  K 4.0  CL 109  CO2 22  GLUCOSE 142*  BUN 19  CREATININE 1.38*  CALCIUM 9.0    Liver Function Tests:  Recent Labs Lab 01/05/16 0229  AST 23  ALT 11*  ALKPHOS 50  BILITOT 0.5  PROT 7.2  ALBUMIN 3.8   No results for input(s): LIPASE, AMYLASE in the last 168 hours. No results for input(s): AMMONIA in the last 168 hours.  CBC:  Recent Labs Lab 01/05/16 0229  WBC 5.4  NEUTROABS 3.5  HGB 14.1  HCT 42.1  MCV 90.3  PLT 154    Cardiac Enzymes:  Recent Labs Lab 01/05/16 0229  TROPONINI <0.03    BNP: Invalid input(s): POCBNP  CBG: No results for input(s): GLUCAP in the last 168 hours.  Microbiology: Results for orders placed or performed during the hospital encounter of 08/20/15  Urine culture     Status: None   Collection Time: 08/21/15 12:05 AM  Result Value Ref Range Status   Specimen Description URINE, CLEAN CATCH  Final   Special Requests NONE  Final   Culture NO GROWTH Performed at [facility]   Final   Report Status 08/22/2015 FINAL  Final  Blood Culture (routine x 2)     Status: None   Collection Time: 08/21/15 12:07 AM  Result Value Ref Range Status   Specimen Description BLOOD RIGHT ASSIST CONTROL  Final   Special Requests BOTTLES DRAWN AEROBIC AND ANAEROBIC 7ML  Final   Culture NO GROWTH 5 DAYS  Final   Report Status 08/26/2015 FINAL  Final  Blood Culture (routine x 2)     Status: None   Collection  Time: 08/21/15 12:07 AM  Result Value Ref Range Status   Specimen Description BLOOD LEFT WRIST  Final   Special Requests BOTTLES DRAWN AEROBIC AND ANAEROBIC 5ML  Final   Culture NO GROWTH 5 DAYS  Final   Report Status 08/26/2015 FINAL  Final    Coagulation Studies:  Recent Labs  01/05/16 0229  LABPROT 13.3  INR 1.01    Urinalysis:   Recent Labs Lab 01/05/16 0349  COLORURINE YELLOW*  LABSPEC 1.014  PHURINE 5.0  GLUCOSEU NEGATIVE  HGBUR NEGATIVE  BILIRUBINUR NEGATIVE  KETONESUR NEGATIVE  PROTEINUR NEGATIVE  NITRITE NEGATIVE  LEUKOCYTESUR NEGATIVE    Lipid Panel:     Component Value Date/Time   CHOL 200 01/05/2016 0229   CHOL 128 05/30/2013 0326   TRIG 81 01/05/2016 0229   TRIG 137 05/30/2013 0326   HDL 60 01/05/2016 0229   HDL 43 05/30/2013 0326   CHOLHDL 3.3 01/05/2016 0229   VLDL 16 01/05/2016 0229   VLDL 27 05/30/2013 0326   LDLCALC 124 (H) 01/05/2016 0229   LDLCALC 58 05/30/2013 0326    HgbA1C:  Lab Results  Component Value Date   HGBA1C  5.8 08/21/2015    Urine Drug Screen:      Component Value Date/Time   LABOPIA NONE DETECTED 01/05/2016 0349   COCAINSCRNUR NONE DETECTED 01/05/2016 0349   LABBENZ NONE DETECTED 01/05/2016 0349   AMPHETMU NONE DETECTED 01/05/2016 0349   THCU NONE DETECTED 01/05/2016 0349   LABBARB NONE DETECTED 01/05/2016 0349    Alcohol Level:   Recent Labs Lab 01/05/16 0229  ETH <5     Imaging: Dg Chest 2 View  Result Date: 01/05/2016 CLINICAL DATA:  Altered mental status, seizure activity, shortness of breath EXAM: CHEST  2 VIEW COMPARISON:  08/20/2015 FINDINGS: Persistent low lung volumes as before. Stable cardiomegaly with chronic vascular congestion and interstitial prominence. Ill-defined left basilar opacity obscures left hemidiaphragm. Difficult to exclude left basilar airspace process or pneumonia. Trachea is midline. Atherosclerosis noted of the aorta. Degenerative changes of the spine and shoulders. Bones are  osteopenic. IMPRESSION: Persistent low lung volumes with chronic interstitial opacities compatible with interstitial lung disease or chronic edema. Recurrent or persistent left basilar airspace opacity suspicious for pneumonia. Electronically Signed   By: Jerilynn Mages.  Shick M.D.   On: 01/05/2016 13:29   Ct Head Wo Contrast  Result Date: 01/05/2016 CLINICAL DATA:  Altered mental status EXAM: CT HEAD WITHOUT CONTRAST TECHNIQUE: Contiguous axial images were obtained from the base of the skull through the vertex without intravenous contrast. COMPARISON:  Head CT 08/21/2015 FINDINGS: Brain: No mass lesion, intraparenchymal hemorrhage or extra-axial collection. No evidence of acute cortical infarct. There is periventricular hypoattenuation compatible with chronic microvascular disease. Vascular: Atherosclerotic calcification of the internal carotid arteries at the skullbase. Skull: Normal visualized skull base, calvarium and extracranial soft tissues. Sinuses/Orbits: No sinus fluid levels or advanced mucosal thickening. No mastoid effusion. Normal orbits. IMPRESSION: 1. No acute intracranial abnormality. 2. Chronic microvascular ischemia. Electronically Signed   By: Ulyses Jarred M.D.   On: 01/05/2016 02:55   Mr Jodene Nam Neck W Wo Contrast  Result Date: 01/05/2016 CLINICAL DATA:  Possible seizure.  Generalized convulsions. EXAM: MRI HEAD WITHOUT CONTRAST MRA HEAD WITHOUT CONTRAST MRA NECK WITHOUT AND WITH CONTRAST TECHNIQUE: Multiplanar, multiecho pulse sequences of the brain and surrounding structures were obtained without intravenous contrast. Angiographic images of the Circle of Willis were obtained using MRA technique without intravenous contrast. Angiographic images of the neck were obtained using MRA technique without and with intravenous contrast. Carotid stenosis measurements (when applicable) are obtained utilizing NASCET criteria, using the distal internal carotid diameter as the denominator. CONTRAST:  97mL  MULTIHANCE GADOBENATE DIMEGLUMINE 529 MG/ML IV SOLN COMPARISON:  Head CT 01/05/2016 and MRI 05/30/2013 FINDINGS: MRI HEAD FINDINGS Brain: There is no evidence of acute infarct, intracranial hemorrhage, mass, midline shift, or extra-axial fluid collection. Mild generalized cerebral atrophy is unchanged from the prior MRI and within normal limits for age. Periventricular and subcortical white matter T2 hyperintensities are also unchanged and nonspecific but compatible with mild chronic small vessel ischemic disease. Vascular: Poorly visualized distal right vertebral artery, unchanged and more fully evaluated on concurrent MRA. Other major intracranial vascular flow voids are preserved. Skull and upper cervical spine: Unremarkable bone marrow signal. Sinuses/Orbits: Prior bilateral cataract extraction. Mild bilateral maxillary sinus mucosal thickening. Clear mastoid air cells. Other: None. MRA HEAD FINDINGS The study is mildly motion degraded. Diminished signal through the level of the circle of Willis particularly limits assessment and is felt to be technical in nature. The visualized distal left vertebral artery is patent and dominant, supplying the basilar. Diminished signal throughout the left V4  segment is felt to be technical in nature given appearance on the concurrent contrast-enhanced neck MRA, however this limits assessment for underlying stenosis and mild-to-moderate distal V4 narrowing is not excluded based on the postcontrast neck MRA. Flow related enhancement is present in the distal aspect of the right V3 segment, however the right vertebral artery is not visualized intracranially. AICA and SCA origins are patent. Basilar artery is widely patent. There is a small right posterior communicating artery. PCAs are patent without evidence of P1 stenosis. The P2 segments are grossly patent, however diminished signal limits evaluation for stenosis bilaterally. The internal carotid arteries are patent from  skullbase to carotid termini without definite stenosis identified, although diminished signal near the anterior genu bilaterally limits assessment of these regions. The MCA origins are patent bilaterally. Diminished signal in the more distal M1 and proximal M2 segments bilaterally appears technical in nature and results in largely nondiagnostic evaluation of these regions. Robust flow related enhancement is present more distally in MCA branches in the sylvian fissures bilaterally. The A1 segments are patent with the right being mildly dominant. Diminished signal through the distal A1 and proximal A2 levels limits assessment, however there is normal flow related enhancement more distally in both A2 segments. No intracranial aneurysm is identified within these limitations. MRA NECK FINDINGS Standard 3 vessel aortic arch. Brachiocephalic and subclavian arteries are patent. There is a kinked appearance of the proximal right subclavian artery. The postcontrast MRA images suggest a potentially high-grade stenosis of the proximal right ICA, however there is significant motion artifact through this region which limits assessment, and the stenosis does not appear quite as severe on the noncontrast time-of-flight sequence. The common carotid arteries and left cervical internal carotid artery are patent without evidence of flow limiting stenosis. A mild to moderate stenosis is questioned in the proximal to mid left cervical ICA, however this may be artifactual. Antegrade flow is identified in both vertebral arteries on the noncontrast time-of-flight technique. The left vertebral artery is strongly dominant without evidence of flow limiting stenosis. Motion artifact mildly limits evaluation of the proximal left V3 segment. The right vertebral artery is hypoplastic and not well-visualized on the postcontrast sequence due to its small size and motion artifact. The vessel could be intermittently occluded in the V3 and V4 segments  versus ending in PICA. IMPRESSION: 1. No acute intracranial abnormality. 2. Mild chronic small vessel ischemic disease. 3. Significantly limited head and neck MRA due to motion and technical factors as above. 4. Potentially high-grade proximal right ICA stenosis. Consider CTA for further evaluation. 5. 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 Electronically Signed   By: Logan Bores M.D.   On: 01/05/2016 13:50   Mr Brain Wo Contrast  Result Date: 01/05/2016 CLINICAL DATA:  Possible seizure.  Generalized convulsions. EXAM: MRI HEAD WITHOUT CONTRAST MRA HEAD WITHOUT CONTRAST MRA NECK WITHOUT AND WITH CONTRAST TECHNIQUE: Multiplanar, multiecho pulse sequences of the brain and surrounding structures were obtained without intravenous contrast. Angiographic images of the Circle of Willis were obtained using MRA technique without intravenous contrast. Angiographic images of the neck were obtained using MRA technique without and with intravenous contrast. Carotid stenosis measurements (when applicable) are obtained utilizing NASCET criteria, using the distal internal carotid diameter as the denominator. CONTRAST:  47mL MULTIHANCE GADOBENATE DIMEGLUMINE 529 MG/ML IV SOLN COMPARISON:  Head CT 01/05/2016 and MRI 05/30/2013 FINDINGS: MRI HEAD FINDINGS Brain: There is no evidence of acute infarct, intracranial hemorrhage, mass, midline shift, or extra-axial fluid collection. Mild generalized  cerebral atrophy is unchanged from the prior MRI and within normal limits for age. Periventricular and subcortical white matter T2 hyperintensities are also unchanged and nonspecific but compatible with mild chronic small vessel ischemic disease. Vascular: Poorly visualized distal right vertebral artery, unchanged and more fully evaluated on concurrent MRA. Other major intracranial vascular flow voids are preserved. Skull and upper cervical spine: Unremarkable bone marrow signal. Sinuses/Orbits: Prior bilateral  cataract extraction. Mild bilateral maxillary sinus mucosal thickening. Clear mastoid air cells. Other: None. MRA HEAD FINDINGS The study is mildly motion degraded. Diminished signal through the level of the circle of Willis particularly limits assessment and is felt to be technical in nature. The visualized distal left vertebral artery is patent and dominant, supplying the basilar. Diminished signal throughout the left V4 segment is felt to be technical in nature given appearance on the concurrent contrast-enhanced neck MRA, however this limits assessment for underlying stenosis and mild-to-moderate distal V4 narrowing is not excluded based on the postcontrast neck MRA. Flow related enhancement is present in the distal aspect of the right V3 segment, however the right vertebral artery is not visualized intracranially. AICA and SCA origins are patent. Basilar artery is widely patent. There is a small right posterior communicating artery. PCAs are patent without evidence of P1 stenosis. The P2 segments are grossly patent, however diminished signal limits evaluation for stenosis bilaterally. The internal carotid arteries are patent from skullbase to carotid termini without definite stenosis identified, although diminished signal near the anterior genu bilaterally limits assessment of these regions. The MCA origins are patent bilaterally. Diminished signal in the more distal M1 and proximal M2 segments bilaterally appears technical in nature and results in largely nondiagnostic evaluation of these regions. Robust flow related enhancement is present more distally in MCA branches in the sylvian fissures bilaterally. The A1 segments are patent with the right being mildly dominant. Diminished signal through the distal A1 and proximal A2 levels limits assessment, however there is normal flow related enhancement more distally in both A2 segments. No intracranial aneurysm is identified within these limitations. MRA NECK  FINDINGS Standard 3 vessel aortic arch. Brachiocephalic and subclavian arteries are patent. There is a kinked appearance of the proximal right subclavian artery. The postcontrast MRA images suggest a potentially high-grade stenosis of the proximal right ICA, however there is significant motion artifact through this region which limits assessment, and the stenosis does not appear quite as severe on the noncontrast time-of-flight sequence. The common carotid arteries and left cervical internal carotid artery are patent without evidence of flow limiting stenosis. A mild to moderate stenosis is questioned in the proximal to mid left cervical ICA, however this may be artifactual. Antegrade flow is identified in both vertebral arteries on the noncontrast time-of-flight technique. The left vertebral artery is strongly dominant without evidence of flow limiting stenosis. Motion artifact mildly limits evaluation of the proximal left V3 segment. The right vertebral artery is hypoplastic and not well-visualized on the postcontrast sequence due to its small size and motion artifact. The vessel could be intermittently occluded in the V3 and V4 segments versus ending in PICA. IMPRESSION: 1. No acute intracranial abnormality. 2. Mild chronic small vessel ischemic disease. 3. Significantly limited head and neck MRA due to motion and technical factors as above. 4. Potentially high-grade proximal right ICA stenosis. Consider CTA for further evaluation. 5. 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 Electronically Signed   By: Logan Bores M.D.   On: 01/05/2016 13:50   US Carotid Bilateral (at  [facility])  Result Date: 01/05/2016 CLINICAL DATA:  TIA EXAM: BILATERAL CAROTID DUPLEX ULTRASOUND TECHNIQUE: Pearline Cables scale imaging, color Doppler and duplex ultrasound were performed of bilateral carotid and vertebral arteries in the neck. COMPARISON:  None. FINDINGS: Criteria: Quantification of carotid stenosis is  based on velocity parameters that correlate the residual internal carotid diameter with NASCET-based stenosis levels, using the diameter of the distal internal carotid lumen as the denominator for stenosis measurement. The following velocity measurements were obtained: RIGHT ICA:  119 cm/sec CCA:  98 cm/sec SYSTOLIC ICA/CCA RATIO:  1.2 DIASTOLIC ICA/CCA RATIO:  1.3 ECA:  127 cm/sec LEFT ICA:  80 cm/sec CCA:  123456 cm/sec SYSTOLIC ICA/CCA RATIO:  0.7 DIASTOLIC ICA/CCA RATIO:  1.3 ECA:  128 cm/sec RIGHT CAROTID ARTERY: Little if any plaque in the bulb. Low resistance internal carotid Doppler pattern. RIGHT VERTEBRAL ARTERY:  Antegrade. LEFT CAROTID ARTERY: Little if any plaque in the bulb. Low resistance internal carotid Doppler pattern. LEFT VERTEBRAL ARTERY:  Antegrade. IMPRESSION: Less than 50% stenosis in the right and left internal carotid arteries. Electronically Signed   By: Marybelle Killings M.D.   On: 01/05/2016 13:44   Mr Jodene Nam Head/brain [id number] Cm  Result Date: 01/05/2016 CLINICAL DATA:  Possible seizure.  Generalized convulsions. EXAM: MRI HEAD WITHOUT CONTRAST MRA HEAD WITHOUT CONTRAST MRA NECK WITHOUT AND WITH CONTRAST TECHNIQUE: Multiplanar, multiecho pulse sequences of the brain and surrounding structures were obtained without intravenous contrast. Angiographic images of the Circle of Willis were obtained using MRA technique without intravenous contrast. Angiographic images of the neck were obtained using MRA technique without and with intravenous contrast. Carotid stenosis measurements (when applicable) are obtained utilizing NASCET criteria, using the distal internal carotid diameter as the denominator. CONTRAST:  32mL MULTIHANCE GADOBENATE DIMEGLUMINE 529 MG/ML IV SOLN COMPARISON:  Head CT 01/05/2016 and MRI 05/30/2013 FINDINGS: MRI HEAD FINDINGS Brain: There is no evidence of acute infarct, intracranial hemorrhage, mass, midline shift, or extra-axial fluid collection. Mild generalized cerebral atrophy is  unchanged from the prior MRI and within normal limits for age. Periventricular and subcortical white matter T2 hyperintensities are also unchanged and nonspecific but compatible with mild chronic small vessel ischemic disease. Vascular: Poorly visualized distal right vertebral artery, unchanged and more fully evaluated on concurrent MRA. Other major intracranial vascular flow voids are preserved. Skull and upper cervical spine: Unremarkable bone marrow signal. Sinuses/Orbits: Prior bilateral cataract extraction. Mild bilateral maxillary sinus mucosal thickening. Clear mastoid air cells. Other: None. MRA HEAD FINDINGS The study is mildly motion degraded. Diminished signal through the level of the circle of Willis particularly limits assessment and is felt to be technical in nature. The visualized distal left vertebral artery is patent and dominant, supplying the basilar. Diminished signal throughout the left V4 segment is felt to be technical in nature given appearance on the concurrent contrast-enhanced neck MRA, however this limits assessment for underlying stenosis and mild-to-moderate distal V4 narrowing is not excluded based on the postcontrast neck MRA. Flow related enhancement is present in the distal aspect of the right V3 segment, however the right vertebral artery is not visualized intracranially. AICA and SCA origins are patent. Basilar artery is widely patent. There is a small right posterior communicating artery. PCAs are patent without evidence of P1 stenosis. The P2 segments are grossly patent, however diminished signal limits evaluation for stenosis bilaterally. The internal carotid arteries are patent from skullbase to carotid termini without definite stenosis identified, although diminished signal near the anterior genu bilaterally limits assessment of  these regions. The MCA origins are patent bilaterally. Diminished signal in the more distal M1 and proximal M2 segments bilaterally appears technical  in nature and results in largely nondiagnostic evaluation of these regions. Robust flow related enhancement is present more distally in MCA branches in the sylvian fissures bilaterally. The A1 segments are patent with the right being mildly dominant. Diminished signal through the distal A1 and proximal A2 levels limits assessment, however there is normal flow related enhancement more distally in both A2 segments. No intracranial aneurysm is identified within these limitations. MRA NECK FINDINGS Standard 3 vessel aortic arch. Brachiocephalic and subclavian arteries are patent. There is a kinked appearance of the proximal right subclavian artery. The postcontrast MRA images suggest a potentially high-grade stenosis of the proximal right ICA, however there is significant motion artifact through this region which limits assessment, and the stenosis does not appear quite as severe on the noncontrast time-of-flight sequence. The common carotid arteries and left cervical internal carotid artery are patent without evidence of flow limiting stenosis. A mild to moderate stenosis is questioned in the proximal to mid left cervical ICA, however this may be artifactual. Antegrade flow is identified in both vertebral arteries on the noncontrast time-of-flight technique. The left vertebral artery is strongly dominant without evidence of flow limiting stenosis. Motion artifact mildly limits evaluation of the proximal left V3 segment. The right vertebral artery is hypoplastic and not well-visualized on the postcontrast sequence due to its small size and motion artifact. The vessel could be intermittently occluded in the V3 and V4 segments versus ending in PICA. IMPRESSION: 1. No acute intracranial abnormality. 2. Mild chronic small vessel ischemic disease. 3. Significantly limited head and neck MRA due to motion and technical factors as above. 4. Potentially high-grade proximal right ICA stenosis. Consider CTA for further evaluation.  5. 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 Electronically Signed   By: Logan Bores M.D.   On: 01/05/2016 13:50     Assessment/Plan:  82 y.o. male with a known history of GERD, hyperlipidemia, CVA, hypertension, prostatitis presented to the emergency room for evaluation for generalized body shaking. Patient's wife woke up around 1:45 AM and noticed that patient was having generalized tonic-clonic convulsion. Patient had history of seizure in the past. Pt does not recall event and currently back to his normal self.   Discussed with pt's son.  Pt is currently back to baseline as per family.   Pt had a similar episode In August MRI no acute abnormalities Unlikely that routine EEG will be off assistance at this point Con't Keppra 500 BID D/c planning.    Leotis Pain   01/06/2016, 10:04 AM

## 2016-01-06 NOTE — Care Management (Signed)
Admitted to [facility] with the diagnosis of seizures. Lives with wife. Last seen Dr. Emily Filbert about a month ago. No home health in the past. No skilled facility. No home oxygen. Takes care of all basic activities of daily living himself, drives. No falls. Fair appetite. Prescriptions ar filled at [facility] or [facility]. Uses no aids for ambulation. Wife will transport. Physical therapy evaluation competed. Recommends home with home health/physical therapy. Rolling walker. Discussed agencies in the home. [place]. Will update Floydene Flock, [facility] representative. Possible discharge to home today per Dr. Bridgett Larsson. Shelbie Ammons RN MSN CCM Care Management

## 2016-01-06 NOTE — Progress Notes (Signed)
Spoke was Dr Bridgett Larsson about chest xray and  Telemetry results, no new orders

## 2016-01-06 NOTE — Care Management Obs Status (Signed)
[place] NOTIFICATION   Patient Details  Name: Albert Boone MRN: [id number] Date of Birth: 10/22/1922   Medicare Observation Status Notification Given:  Yes    Shelbie Ammons, RN 01/06/2016, 11:42 AM

## 2016-01-06 NOTE — Evaluation (Signed)
Physical Therapy Evaluation Patient Details Name: OSA BARTNIK MRN: [id number] DOB: February 09, 1922 Today's Date: 01/06/2016   History of Present Illness  presented to ER secondary to tonic-clonic like activity; admitted with seizure vs. TIA.  MRI negative for acute change.  Clinical Impression  Upon evaluation, patient alert and oriented; follows all commands and demonstrates fair/good insight and safety awareness.  Mild weakness L UE/LE noted with isolated testing and functional activities; patient/family unable to relay if this is new vs chronic finding.  Currently requiring cga/min assist for all transfers and gait; veers laterally with head turns, LOB x1 during speed modulation and change of direction during gait trial requiring assist from therapist to prevent fall.  Decreased step height/length of  L LE noted throughout gait trial, worsened with fatigue or divided attention.  Significant decrease in self-selected gait speed, indicative of increased fall risk with functional mobility.  All mobility improved to close sup (with improved symmetry, fluidity and safety) with use of RW.  Do recommend continued use of RW at this time; patient/family aware and voiced understanding (plan to check with family friend on availability of borrowed RW). Would benefit from skilled PT to address above deficits and promote optimal return to PLOF; Recommend transition to [place] upon discharge from acute hospitalization.     Follow Up Recommendations Home health PT    Equipment Recommendations  Rolling walker with 5" wheels    Recommendations for Other Services       Precautions / Restrictions Precautions Precautions: Fall Restrictions Weight Bearing Restrictions: No      Mobility  Bed Mobility Overal bed mobility: Modified Independent                Transfers Overall transfer level: Needs assistance   Transfers: Sit to/from Stand Sit to Stand: Min guard             Ambulation/Gait Ambulation/Gait assistance: Min guard;Min assist Ambulation Distance (Feet): 220 Feet Assistive device: None   Gait velocity: 10' walk time, 10-11 seconds   General Gait Details: mild decrease in L LE step height/length, decrease L arm swing and overall trunk rotation.  Veers laterally with dynamic gait components and requires min assist to recover LOB with speed modulation and change of direction.  Stairs            Wheelchair Mobility    Modified Rankin (Stroke Patients Only)       Balance Overall balance assessment: Needs assistance Sitting-balance support: No upper extremity supported;Feet supported Sitting balance-Leahy Scale: Good     Standing balance support: No upper extremity supported Standing balance-Leahy Scale: Fair                   Standardized Balance Assessment Standardized Balance Assessment :  (Modified DGI, 8/12-veers with head turns, LOB with speed modulation/direction change)           Pertinent Vitals/Pain Pain Assessment: No/denies pain    Home Living Family/patient expects to be discharged to:: Private residence Living Arrangements: Spouse/significant other;Other relatives (grandson, self-employed and can provide frequent sup/assist throughout day) Available Help at Discharge: Family;Available PRN/intermittently Type of Home: House Home Access: Level entry     Home Layout: One level Home Equipment: Cane - single point      Prior Function Level of Independence: Independent         Comments: Indep with ADLs, household and community mobility; endorses two falls within previous six months.  Did bout of HHPT in August (with good results)  and returned to mobility without assist device (after brief use of SPC)     Hand Dominance        Extremity/Trunk Assessment   Upper Extremity Assessment Upper Extremity Assessment:  (R UE 4+/5, L UE 4-/5; denies sensory deficit; no drift noted)    Lower Extremity  Assessment Lower Extremity Assessment:  (R LE 4+/5, L LE 4-/5; denies sensory deficit; negative babinski, clnus)       Communication   Communication: HOH  Cognition Arousal/Alertness: Awake/alert Behavior During Therapy: WFL for tasks assessed/performed Overall Cognitive Status: Within Functional Limits for tasks assessed                      General Comments      Exercises Other Exercises Other Exercises: 220' with RW-improved to close sup; improved symmetry of gait performance.  Improved L LE step height/length and overall confidence with mobility.  Do recommend continued use of RW for all functional mobility at this time.   Assessment/Plan    PT Assessment Patient needs continued PT services  PT Problem List Decreased strength;Decreased activity tolerance;Decreased balance;Decreased mobility;Decreased coordination;Decreased knowledge of use of DME;Decreased knowledge of precautions          PT Treatment Interventions DME instruction;Gait training;Functional mobility training;Therapeutic activities;Therapeutic exercise;Balance training;Patient/family education    PT Goals (Current goals can be found in the Care Plan section)  Acute Rehab PT Goals Patient Stated Goal: to return home PT Goal Formulation: With patient/family Time For Goal Achievement: 01/20/16 Potential to Achieve Goals: Good Additional Goals Additional Goal #1: Modified DGI >10/12 for safety/indep with functional mobility.    Frequency Min 2X/week   Barriers to discharge        Co-evaluation               End of Session Equipment Utilized During Treatment: Gait belt Activity Tolerance: Patient tolerated treatment well Patient left: in bed;with call bell/phone within reach;with bed alarm set;with family/visitor present Nurse Communication: Mobility status    Functional Assessment Tool Used: clinical judgement, gait speed, modified DGI Functional Limitation: Mobility: Walking and  moving around Mobility: Walking and Moving Around Current Status [id number]): At least 20 percent but less than 40 percent impaired, limited or restricted Mobility: Walking and Moving Around Goal Status [phone]): At least 1 percent but less than 20 percent impaired, limited or restricted    Time: [id number] PT Time Calculation (min) (ACUTE ONLY): 20 min   Charges:   PT Evaluation $PT Eval Low Complexity: 1 Procedure PT Treatments $Gait Training: 8-22 mins   PT G Codes:   PT G-Codes **NOT FOR INPATIENT CLASS** Functional Assessment Tool Used: clinical judgement, gait speed, modified DGI Functional Limitation: Mobility: Walking and moving around Mobility: Walking and Moving Around Current Status [id number]): At least 20 percent but less than 40 percent impaired, limited or restricted Mobility: Walking and Moving Around Goal Status [phone]): At least 1 percent but less than 20 percent impaired, limited or restricted   Einer Meals H. Owens Shark, PT, DPT, NCS 01/06/16, 1:38 PM [phone]

## 2016-01-06 NOTE — Evaluation (Signed)
SLP screening: SLP conducted speech and swallowing screening and pt found to be wfl. Spoke to nursing who reports he appears [facility] as well. No indication of need for evaluation or treatment at this time.  Leafy Kindle, MA/CCC-SLP

## 2016-01-06 NOTE — Progress Notes (Signed)
OT Cancellation Note  Patient Details Name: Albert Boone MRN: [id number] DOB: May 07, 1922   Cancelled Treatment:    Reason Eval/Treat Not Completed: Patient at procedure or test/ unavailable  Harrel Carina, MS, OTR/L  01/06/2016, 9:41 AM

## 2016-01-06 NOTE — Discharge Summary (Signed)
[facility] NAME: Jeron Lavy    MR#:  [id number]  DATE OF BIRTH:  1922/05/04  DATE OF ADMISSION:  01/06/2016  ADMITTING PHYSICIAN: Saundra Shelling, MD  DATE OF DISCHARGE: No discharge date for patient encounter.  PRIMARY CARE PHYSICIAN: Rusty Aus, MD   ADMISSION DIAGNOSIS:  poss stroke DISCHARGE DIAGNOSIS:  Principal Problem:   Seizure ([place])  SECONDARY DIAGNOSIS:   Past Medical History:  Diagnosis Date  . GERD (gastroesophageal reflux disease)   . Hyperlipemia   . Hypertension   . Prostatitis   . Stroke [facility])    mini strokes   HOSPITAL COURSE:  82 year old male patient with history of GERD, hypertension and hyperlipidemia, CVA and prostatitis presented to the emergency room after having an episode of convulsion.  1.Seizure versus transient ischemic attack Seizure, aspiration and fall precaution. keppra bid per Dr. Macie Burows. No acute CVA per MRI and MRA brain, echocardiogram is pending and carotid duplex: Less than 50% stenosis in the right and left internal carotid Arteries. PT and OT evaluation: HHPT.  Continue plavix.  2. hypertension. Controlled without hypertension medication. 3. CKD stage 3. Stable.  DISCHARGE CONDITIONS:  Stable, discharge to home with HHPT today. CONSULTS OBTAINED:  Treatment Team:  Leotis Pain, MD DRUG ALLERGIES:   Allergies  Allergen Reactions  . Penicillins Rash    Has patient had a PCN reaction causing immediate rash, facial/tongue/throat swelling, SOB or lightheadedness with hypotension: Yes Has patient had a PCN reaction causing severe rash involving mucus membranes or skin necrosis: No Has patient had a PCN reaction that required hospitalization No Has patient had a PCN reaction occurring within the last 10 years: No If all of the above answers are "NO", then may proceed with Cephalosporin use.    DISCHARGE MEDICATIONS:   Allergies as of 01/06/2016    Reactions   Penicillins Rash   Has patient had a PCN reaction causing immediate rash, facial/tongue/throat swelling, SOB or lightheadedness with hypotension: Yes Has patient had a PCN reaction causing severe rash involving mucus membranes or skin necrosis: No Has patient had a PCN reaction that required hospitalization No Has patient had a PCN reaction occurring within the last 10 years: No If all of the above answers are "NO", then may proceed with Cephalosporin use.      Medication List    STOP taking these medications   levofloxacin 250 MG tablet Commonly known as:  LEVAQUIN     TAKE these medications   clopidogrel 75 MG tablet Commonly known as:  PLAVIX Take by mouth.   levETIRAcetam 500 MG tablet Commonly known as:  KEPPRA Take 1 tablet (500 mg total) by mouth 2 (two) times daily.   multivitamin with minerals tablet Take 1 tablet by mouth daily.   omeprazole 20 MG capsule Commonly known as:  PRILOSEC Take 20 mg by mouth 2 (two) times daily.   terazosin 5 MG capsule Commonly known as:  HYTRIN Take 1 capsule (5 mg total) by mouth 2 (two) times daily. What changed:  when to take this            Durable Medical Equipment        Start     Ordered   01/06/16 1113  For home use only DME Walker rolling  Once    Question:  Patient needs a walker to treat with the following condition  Answer:  Weakness   01/06/16 1114  DISCHARGE INSTRUCTIONS:  See AVS.  If you experience worsening of your admission symptoms, develop shortness of breath, life threatening emergency, suicidal or homicidal thoughts you must seek medical attention immediately by calling 911 or calling your MD immediately  if symptoms less severe.  You Must read complete instructions/literature along with all the possible adverse reactions/side effects for all the Medicines you take and that have been prescribed to you. Take any new Medicines after you have completely understood and accpet all the  possible adverse reactions/side effects.   Please note  You were cared for by a hospitalist during your hospital stay. If you have any questions about your discharge medications or the care you received while you were in the hospital after you are discharged, you can call the unit and asked to speak with the hospitalist on call if the hospitalist that took care of you is not available. Once you are discharged, your primary care physician will handle any further medical issues. Please note that NO REFILLS for any discharge medications will be authorized once you are discharged, as it is imperative that you return to your primary care physician (or establish a relationship with a primary care physician if you do not have one) for your aftercare needs so that they can reassess your need for medications and monitor your lab values.    On the day of Discharge:  VITAL SIGNS:  Blood pressure 140/81, pulse 68, temperature 97.6 F (36.4 C), temperature source Oral, resp. rate 16, height 5\' 9"  (1.753 m), weight 163 lb 3.2 oz (74 kg), SpO2 96 %. PHYSICAL EXAMINATION:  GENERAL:  82 y.o.-year-old patient lying in the bed with no acute distress.  EYES: Pupils equal, round, reactive to light and accommodation. No scleral icterus. Extraocular muscles intact.  HEENT: Head atraumatic, normocephalic. Oropharynx and nasopharynx clear.  NECK:  Supple, no jugular venous distention. No thyroid enlargement, no tenderness.  LUNGS: Normal breath sounds bilaterally, no wheezing, rales,rhonchi or crepitation. No use of accessory muscles of respiration.  CARDIOVASCULAR: S1, S2 normal. No murmurs, rubs, or gallops.  ABDOMEN: Soft, non-tender, non-distended. Bowel sounds present. No organomegaly or mass.  EXTREMITIES: No pedal edema, cyanosis, or clubbing.  NEUROLOGIC: Cranial nerves II through XII are intact. Muscle strength 5/5 in all extremities. Sensation intact. Unsteady gait.  PSYCHIATRIC: The patient is alert and  oriented x 3.  SKIN: No obvious rash, lesion, or ulcer.  DATA REVIEW:   CBC  Recent Labs Lab 01/05/16 0229  WBC 5.4  HGB 14.1  HCT 42.1  PLT 154    Chemistries   Recent Labs Lab 01/05/16 0229  NA 140  K 4.0  CL 109  CO2 22  GLUCOSE 142*  BUN 19  CREATININE 1.38*  CALCIUM 9.0  AST 23  ALT 11*  ALKPHOS 50  BILITOT 0.5     Microbiology Results  Results for orders placed or performed during the hospital encounter of 08/20/15  Urine culture     Status: None   Collection Time: 08/21/15 12:05 AM  Result Value Ref Range Status   Specimen Description URINE, CLEAN CATCH  Final   Special Requests NONE  Final   Culture NO GROWTH Performed at [facility]   Final   Report Status 08/22/2015 FINAL  Final  Blood Culture (routine x 2)     Status: None   Collection Time: 08/21/15 12:07 AM  Result Value Ref Range Status   Specimen Description BLOOD RIGHT ASSIST CONTROL  Final   Special Requests  BOTTLES DRAWN AEROBIC AND ANAEROBIC 7ML  Final   Culture NO GROWTH 5 DAYS  Final   Report Status 08/26/2015 FINAL  Final  Blood Culture (routine x 2)     Status: None   Collection Time: 08/21/15 12:07 AM  Result Value Ref Range Status   Specimen Description BLOOD LEFT WRIST  Final   Special Requests BOTTLES DRAWN AEROBIC AND ANAEROBIC 5ML  Final   Culture NO GROWTH 5 DAYS  Final   Report Status 08/26/2015 FINAL  Final    RADIOLOGY:  Dg Chest 2 View  Result Date: 01/05/2016 CLINICAL DATA:  Altered mental status, seizure activity, shortness of breath EXAM: CHEST  2 VIEW COMPARISON:  08/20/2015 FINDINGS: Persistent low lung volumes as before. Stable cardiomegaly with chronic vascular congestion and interstitial prominence. Ill-defined left basilar opacity obscures left hemidiaphragm. Difficult to exclude left basilar airspace process or pneumonia. Trachea is midline. Atherosclerosis noted of the aorta. Degenerative changes of the spine and shoulders. Bones are osteopenic.  IMPRESSION: Persistent low lung volumes with chronic interstitial opacities compatible with interstitial lung disease or chronic edema. Recurrent or persistent left basilar airspace opacity suspicious for pneumonia. Electronically Signed   By: Jerilynn Mages.  Shick M.D.   On: 01/05/2016 13:29   Mr Jodene Nam Neck W Wo Contrast  Result Date: 01/05/2016 CLINICAL DATA:  Possible seizure.  Generalized convulsions. EXAM: MRI HEAD WITHOUT CONTRAST MRA HEAD WITHOUT CONTRAST MRA NECK WITHOUT AND WITH CONTRAST TECHNIQUE: Multiplanar, multiecho pulse sequences of the brain and surrounding structures were obtained without intravenous contrast. Angiographic images of the Circle of Willis were obtained using MRA technique without intravenous contrast. Angiographic images of the neck were obtained using MRA technique without and with intravenous contrast. Carotid stenosis measurements (when applicable) are obtained utilizing NASCET criteria, using the distal internal carotid diameter as the denominator. CONTRAST:  28mL MULTIHANCE GADOBENATE DIMEGLUMINE 529 MG/ML IV SOLN COMPARISON:  Head CT 01/05/2016 and MRI 05/30/2013 FINDINGS: MRI HEAD FINDINGS Brain: There is no evidence of acute infarct, intracranial hemorrhage, mass, midline shift, or extra-axial fluid collection. Mild generalized cerebral atrophy is unchanged from the prior MRI and within normal limits for age. Periventricular and subcortical white matter T2 hyperintensities are also unchanged and nonspecific but compatible with mild chronic small vessel ischemic disease. Vascular: Poorly visualized distal right vertebral artery, unchanged and more fully evaluated on concurrent MRA. Other major intracranial vascular flow voids are preserved. Skull and upper cervical spine: Unremarkable bone marrow signal. Sinuses/Orbits: Prior bilateral cataract extraction. Mild bilateral maxillary sinus mucosal thickening. Clear mastoid air cells. Other: None. MRA HEAD FINDINGS The study is mildly  motion degraded. Diminished signal through the level of the circle of Willis particularly limits assessment and is felt to be technical in nature. The visualized distal left vertebral artery is patent and dominant, supplying the basilar. Diminished signal throughout the left V4 segment is felt to be technical in nature given appearance on the concurrent contrast-enhanced neck MRA, however this limits assessment for underlying stenosis and mild-to-moderate distal V4 narrowing is not excluded based on the postcontrast neck MRA. Flow related enhancement is present in the distal aspect of the right V3 segment, however the right vertebral artery is not visualized intracranially. AICA and SCA origins are patent. Basilar artery is widely patent. There is a small right posterior communicating artery. PCAs are patent without evidence of P1 stenosis. The P2 segments are grossly patent, however diminished signal limits evaluation for stenosis bilaterally. The internal carotid arteries are patent from skullbase to carotid termini without  definite stenosis identified, although diminished signal near the anterior genu bilaterally limits assessment of these regions. The MCA origins are patent bilaterally. Diminished signal in the more distal M1 and proximal M2 segments bilaterally appears technical in nature and results in largely nondiagnostic evaluation of these regions. Robust flow related enhancement is present more distally in MCA branches in the sylvian fissures bilaterally. The A1 segments are patent with the right being mildly dominant. Diminished signal through the distal A1 and proximal A2 levels limits assessment, however there is normal flow related enhancement more distally in both A2 segments. No intracranial aneurysm is identified within these limitations. MRA NECK FINDINGS Standard 3 vessel aortic arch. Brachiocephalic and subclavian arteries are patent. There is a kinked appearance of the proximal right subclavian  artery. The postcontrast MRA images suggest a potentially high-grade stenosis of the proximal right ICA, however there is significant motion artifact through this region which limits assessment, and the stenosis does not appear quite as severe on the noncontrast time-of-flight sequence. The common carotid arteries and left cervical internal carotid artery are patent without evidence of flow limiting stenosis. A mild to moderate stenosis is questioned in the proximal to mid left cervical ICA, however this may be artifactual. Antegrade flow is identified in both vertebral arteries on the noncontrast time-of-flight technique. The left vertebral artery is strongly dominant without evidence of flow limiting stenosis. Motion artifact mildly limits evaluation of the proximal left V3 segment. The right vertebral artery is hypoplastic and not well-visualized on the postcontrast sequence due to its small size and motion artifact. The vessel could be intermittently occluded in the V3 and V4 segments versus ending in PICA. IMPRESSION: 1. No acute intracranial abnormality. 2. Mild chronic small vessel ischemic disease. 3. Significantly limited head and neck MRA due to motion and technical factors as above. 4. Potentially high-grade proximal right ICA stenosis. Consider CTA for further evaluation. 5. 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 Electronically Signed   By: Logan Bores M.D.   On: 01/05/2016 13:50   Mr Brain Wo Contrast  Result Date: 01/05/2016 CLINICAL DATA:  Possible seizure.  Generalized convulsions. EXAM: MRI HEAD WITHOUT CONTRAST MRA HEAD WITHOUT CONTRAST MRA NECK WITHOUT AND WITH CONTRAST TECHNIQUE: Multiplanar, multiecho pulse sequences of the brain and surrounding structures were obtained without intravenous contrast. Angiographic images of the Circle of Willis were obtained using MRA technique without intravenous contrast. Angiographic images of the neck were obtained using MRA  technique without and with intravenous contrast. Carotid stenosis measurements (when applicable) are obtained utilizing NASCET criteria, using the distal internal carotid diameter as the denominator. CONTRAST:  71mL MULTIHANCE GADOBENATE DIMEGLUMINE 529 MG/ML IV SOLN COMPARISON:  Head CT 01/05/2016 and MRI 05/30/2013 FINDINGS: MRI HEAD FINDINGS Brain: There is no evidence of acute infarct, intracranial hemorrhage, mass, midline shift, or extra-axial fluid collection. Mild generalized cerebral atrophy is unchanged from the prior MRI and within normal limits for age. Periventricular and subcortical white matter T2 hyperintensities are also unchanged and nonspecific but compatible with mild chronic small vessel ischemic disease. Vascular: Poorly visualized distal right vertebral artery, unchanged and more fully evaluated on concurrent MRA. Other major intracranial vascular flow voids are preserved. Skull and upper cervical spine: Unremarkable bone marrow signal. Sinuses/Orbits: Prior bilateral cataract extraction. Mild bilateral maxillary sinus mucosal thickening. Clear mastoid air cells. Other: None. MRA HEAD FINDINGS The study is mildly motion degraded. Diminished signal through the level of the circle of Willis particularly limits assessment and is felt to be technical in  nature. The visualized distal left vertebral artery is patent and dominant, supplying the basilar. Diminished signal throughout the left V4 segment is felt to be technical in nature given appearance on the concurrent contrast-enhanced neck MRA, however this limits assessment for underlying stenosis and mild-to-moderate distal V4 narrowing is not excluded based on the postcontrast neck MRA. Flow related enhancement is present in the distal aspect of the right V3 segment, however the right vertebral artery is not visualized intracranially. AICA and SCA origins are patent. Basilar artery is widely patent. There is a small right posterior communicating  artery. PCAs are patent without evidence of P1 stenosis. The P2 segments are grossly patent, however diminished signal limits evaluation for stenosis bilaterally. The internal carotid arteries are patent from skullbase to carotid termini without definite stenosis identified, although diminished signal near the anterior genu bilaterally limits assessment of these regions. The MCA origins are patent bilaterally. Diminished signal in the more distal M1 and proximal M2 segments bilaterally appears technical in nature and results in largely nondiagnostic evaluation of these regions. Robust flow related enhancement is present more distally in MCA branches in the sylvian fissures bilaterally. The A1 segments are patent with the right being mildly dominant. Diminished signal through the distal A1 and proximal A2 levels limits assessment, however there is normal flow related enhancement more distally in both A2 segments. No intracranial aneurysm is identified within these limitations. MRA NECK FINDINGS Standard 3 vessel aortic arch. Brachiocephalic and subclavian arteries are patent. There is a kinked appearance of the proximal right subclavian artery. The postcontrast MRA images suggest a potentially high-grade stenosis of the proximal right ICA, however there is significant motion artifact through this region which limits assessment, and the stenosis does not appear quite as severe on the noncontrast time-of-flight sequence. The common carotid arteries and left cervical internal carotid artery are patent without evidence of flow limiting stenosis. A mild to moderate stenosis is questioned in the proximal to mid left cervical ICA, however this may be artifactual. Antegrade flow is identified in both vertebral arteries on the noncontrast time-of-flight technique. The left vertebral artery is strongly dominant without evidence of flow limiting stenosis. Motion artifact mildly limits evaluation of the proximal left V3 segment.  The right vertebral artery is hypoplastic and not well-visualized on the postcontrast sequence due to its small size and motion artifact. The vessel could be intermittently occluded in the V3 and V4 segments versus ending in PICA. IMPRESSION: 1. No acute intracranial abnormality. 2. Mild chronic small vessel ischemic disease. 3. Significantly limited head and neck MRA due to motion and technical factors as above. 4. Potentially high-grade proximal right ICA stenosis. Consider CTA for further evaluation. 5. 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 Electronically Signed   By: Logan Bores M.D.   On: 01/05/2016 13:50   US Carotid Bilateral (at [facility] Only)  Result Date: 01/05/2016 CLINICAL DATA:  TIA EXAM: BILATERAL CAROTID DUPLEX ULTRASOUND TECHNIQUE: Pearline Cables scale imaging, color Doppler and duplex ultrasound were performed of bilateral carotid and vertebral arteries in the neck. COMPARISON:  None. FINDINGS: Criteria: Quantification of carotid stenosis is based on velocity parameters that correlate the residual internal carotid diameter with NASCET-based stenosis levels, using the diameter of the distal internal carotid lumen as the denominator for stenosis measurement. The following velocity measurements were obtained: RIGHT ICA:  119 cm/sec CCA:  98 cm/sec SYSTOLIC ICA/CCA RATIO:  1.2 DIASTOLIC ICA/CCA RATIO:  1.3 ECA:  127 cm/sec LEFT ICA:  80 cm/sec CCA:  123456 cm/sec SYSTOLIC ICA/CCA RATIO:  0.7 DIASTOLIC ICA/CCA RATIO:  1.3 ECA:  128 cm/sec RIGHT CAROTID ARTERY: Little if any plaque in the bulb. Low resistance internal carotid Doppler pattern. RIGHT VERTEBRAL ARTERY:  Antegrade. LEFT CAROTID ARTERY: Little if any plaque in the bulb. Low resistance internal carotid Doppler pattern. LEFT VERTEBRAL ARTERY:  Antegrade. IMPRESSION: Less than 50% stenosis in the right and left internal carotid arteries. Electronically Signed   By: Marybelle Killings M.D.   On: 01/05/2016 13:44   Mr Jodene Nam  Head/brain [id number] Cm  Result Date: 01/05/2016 CLINICAL DATA:  Possible seizure.  Generalized convulsions. EXAM: MRI HEAD WITHOUT CONTRAST MRA HEAD WITHOUT CONTRAST MRA NECK WITHOUT AND WITH CONTRAST TECHNIQUE: Multiplanar, multiecho pulse sequences of the brain and surrounding structures were obtained without intravenous contrast. Angiographic images of the Circle of Willis were obtained using MRA technique without intravenous contrast. Angiographic images of the neck were obtained using MRA technique without and with intravenous contrast. Carotid stenosis measurements (when applicable) are obtained utilizing NASCET criteria, using the distal internal carotid diameter as the denominator. CONTRAST:  14mL MULTIHANCE GADOBENATE DIMEGLUMINE 529 MG/ML IV SOLN COMPARISON:  Head CT 01/05/2016 and MRI 05/30/2013 FINDINGS: MRI HEAD FINDINGS Brain: There is no evidence of acute infarct, intracranial hemorrhage, mass, midline shift, or extra-axial fluid collection. Mild generalized cerebral atrophy is unchanged from the prior MRI and within normal limits for age. Periventricular and subcortical white matter T2 hyperintensities are also unchanged and nonspecific but compatible with mild chronic small vessel ischemic disease. Vascular: Poorly visualized distal right vertebral artery, unchanged and more fully evaluated on concurrent MRA. Other major intracranial vascular flow voids are preserved. Skull and upper cervical spine: Unremarkable bone marrow signal. Sinuses/Orbits: Prior bilateral cataract extraction. Mild bilateral maxillary sinus mucosal thickening. Clear mastoid air cells. Other: None. MRA HEAD FINDINGS The study is mildly motion degraded. Diminished signal through the level of the circle of Willis particularly limits assessment and is felt to be technical in nature. The visualized distal left vertebral artery is patent and dominant, supplying the basilar. Diminished signal throughout the left V4 segment is felt to be  technical in nature given appearance on the concurrent contrast-enhanced neck MRA, however this limits assessment for underlying stenosis and mild-to-moderate distal V4 narrowing is not excluded based on the postcontrast neck MRA. Flow related enhancement is present in the distal aspect of the right V3 segment, however the right vertebral artery is not visualized intracranially. AICA and SCA origins are patent. Basilar artery is widely patent. There is a small right posterior communicating artery. PCAs are patent without evidence of P1 stenosis. The P2 segments are grossly patent, however diminished signal limits evaluation for stenosis bilaterally. The internal carotid arteries are patent from skullbase to carotid termini without definite stenosis identified, although diminished signal near the anterior genu bilaterally limits assessment of these regions. The MCA origins are patent bilaterally. Diminished signal in the more distal M1 and proximal M2 segments bilaterally appears technical in nature and results in largely nondiagnostic evaluation of these regions. Robust flow related enhancement is present more distally in MCA branches in the sylvian fissures bilaterally. The A1 segments are patent with the right being mildly dominant. Diminished signal through the distal A1 and proximal A2 levels limits assessment, however there is normal flow related enhancement more distally in both A2 segments. No intracranial aneurysm is identified within these limitations. MRA NECK FINDINGS Standard 3 vessel aortic arch. Brachiocephalic and subclavian arteries are patent. There is a kinked appearance of  the proximal right subclavian artery. The postcontrast MRA images suggest a potentially high-grade stenosis of the proximal right ICA, however there is significant motion artifact through this region which limits assessment, and the stenosis does not appear quite as severe on the noncontrast time-of-flight sequence. The common  carotid arteries and left cervical internal carotid artery are patent without evidence of flow limiting stenosis. A mild to moderate stenosis is questioned in the proximal to mid left cervical ICA, however this may be artifactual. Antegrade flow is identified in both vertebral arteries on the noncontrast time-of-flight technique. The left vertebral artery is strongly dominant without evidence of flow limiting stenosis. Motion artifact mildly limits evaluation of the proximal left V3 segment. The right vertebral artery is hypoplastic and not well-visualized on the postcontrast sequence due to its small size and motion artifact. The vessel could be intermittently occluded in the V3 and V4 segments versus ending in PICA. IMPRESSION: 1. No acute intracranial abnormality. 2. Mild chronic small vessel ischemic disease. 3. Significantly limited head and neck MRA due to motion and technical factors as above. 4. Potentially high-grade proximal right ICA stenosis. Consider CTA for further evaluation. 5. 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 Electronically Signed   By: Logan Bores M.D.   On: 01/05/2016 13:50     Management plans discussed with the patient, his wife and they are in agreement.  CODE STATUS:     Code Status Orders        Start     Ordered   01/05/16 0617  Full code  Continuous     01/05/16 0616    Code Status History    Date Active Date Inactive Code Status Order ID Comments User Context   08/21/2015  4:24 AM 08/22/2015  4:26 PM Full Code [id number]  Harrie Foreman, MD Inpatient    Advance Directive Documentation   Flowsheet Row Most Recent Value  Type of Advance Directive  Healthcare Power of Attorney, Living will  Pre-existing out of facility DNR order (yellow form or pink MOST form)  No data  "MOST" Form in Place?  No data      TOTAL TIME TAKING CARE OF THIS PATIENT: 28 minutes.    Demetrios Loll M.D on 01/06/2016 at 11:20 AM  Between 7am to 6pm - Pager  - [phone]  After 6pm go to [email or web address] - Proofreader  [facility]  Office  [phone]  CC: Primary care physician; Rusty Aus, MD   Note: This dictation was prepared with Dragon dictation along with smaller phrase technology. Any transcriptional errors that result from this process are unintentional.

## 2016-01-06 NOTE — Discharge Instructions (Signed)
Heart healthy diet. Fall precaution. HHPT.

## 2016-01-06 NOTE — Progress Notes (Signed)
PT Cancellation Note  Patient Details Name: Albert Boone MRN: [id number] DOB: 06/26/1922   Cancelled Treatment:    Reason Eval/Treat Not Completed: Patient at procedure or test/unavailable;Pain limiting ability to participate (Consult received and chart reviewed.  Patient currently off unit for diagnostic testing.   Will re-attempt at later time/date as available and medically appropriate.)   Mariel Gaudin H. Owens Shark, PT, DPT, NCS 01/06/16, 9:46 AM [phone]

## 2016-01-06 NOTE — Progress Notes (Signed)
*  PRELIMINARY RESULTS* Echocardiogram 2D Echocardiogram has been performed.  Sherrie Sport 01/06/2016, 9:59 AM

## 2016-01-06 NOTE — Evaluation (Signed)
Occupational Therapy Evaluation Patient Details Name: Albert Boone MRN: [id number] DOB: 06-21-1922 Today's Date: 01/06/2016    History of Present Illness presented to ER secondary to tonic-clonic like activity; admitted with seizure vs. TIA.  MRI negative for acute change.   Clinical Impression   Pt is 82 year old male who presents with above diagnosis.  Pt presents with a flat affect and a delay in responses but cooperative.  Pt has poor safety awareness and fair problem solving skills during functional mobility in room with cues to use his FWW.   Pt is able to complete automatic tasks but tasks requiring planning are more difficult.  Pt is able to complete dressing skills sitting in chair but has decreased balance skills and is at risk for falls when standing and turning or when leaning forward to feet for donning shoes and tying them.  Pt is able to feed himself and perform hygiene tasks with extra time and cues.  Pt has full AROM of BUEs and hands has overall weakness in UEs.  Coordination is intact but slow and requires extra time. Patient and his wife would like to go home.  Rec 24 hour supervision due to decreased balance as well as HH OT in addition to [facility].  Also strongly rec a shower chair with back when he showers in shower stall even though he has grab bars installed.  Also rec either grab bars by toilet or BSC over toilet and rec OT HH assess for which option would be safer based on space in bathroom. Pt to go home today with his wife driving him and his son meeting him at home to ensure he gets into his house safely.  Spoke to [place] from [employer] about rec for OT [facility].    Follow Up Recommendations  Home health OT    Equipment Recommendations  3 in 1 bedside commode;Tub/shower seat (rec BSC or grab bars by toilet and a shower chair with back)    Recommendations for Other Services       Precautions / Restrictions Precautions Precautions: Fall Restrictions Weight  Bearing Restrictions: No      Mobility Bed Mobility                  Transfers                      Balance                                            ADL Overall ADL's : Needs assistance/impaired Eating/Feeding: Independent;Set up   Grooming: Wash/dry hands;Wash/dry face;Oral care;Applying deodorant;Brushing hair;Independent           Upper Body Dressing : Minimal assistance;Set up   Lower Body Dressing: Minimal assistance;Set up;Sit to/from stand Lower Body Dressing Details (indicate cue type and reason): cues and SBA for balance Toilet Transfer: Supervision/safety;Set up                   Vision     Perception     Praxis      Pertinent Vitals/Pain Pain Assessment: No/denies pain     Hand Dominance Right   Extremity/Trunk Assessment Upper Extremity Assessment Upper Extremity Assessment: Generalized weakness   Lower Extremity Assessment Lower Extremity Assessment: Defer to PT evaluation       Communication Communication Communication: [facility]  Cognition Arousal/Alertness: Awake/alert Behavior During Therapy: WFL for tasks assessed/performed;Flat affect (slow processing speed but functional to achieve tasks ) Overall Cognitive Status: Within Functional Limits for tasks assessed                     General Comments       Exercises       Shoulder Instructions      Home Living Family/patient expects to be discharged to:: Private residence Living Arrangements: Spouse/significant other;Other relatives Available Help at Discharge: Family;Available PRN/intermittently Type of Home: House Home Access: Level entry     Home Layout: One level     Bathroom Shower/Tub: Walk-in shower;Door   [employer] Toilet: Standard Bathroom Accessibility: Yes   Home Equipment: Cane - single point;Grab bars - tub/shower   Additional Comments: rec grab bars or BSC over toilet as well      Prior  Functioning/Environment Level of Independence: Independent        Comments: Indep with ADLs, household and community mobility; endorses two falls within previous six months.        OT Problem List: Decreased activity tolerance;Impaired balance (sitting and/or standing);Decreased safety awareness   OT Treatment/Interventions: Self-care/ADL training;Patient/family education;Therapeutic activities;Balance training    OT Goals(Current goals can be found in the care plan section) Acute Rehab OT Goals Patient Stated Goal: to return home OT Goal Formulation: With patient/family Time For Goal Achievement: 01/20/16 Potential to Achieve Goals: Good ADL Goals Pt Will Perform Lower Body Dressing: with supervision;sit to/from stand (with no LOB using FWW ) Pt Will Transfer to Toilet: with supervision;stand pivot transfer (using FWW) Pt Will Perform Tub/Shower Transfer: with supervision;shower seat  OT Frequency: Min 1X/week   Barriers to D/C:            Co-evaluation              End of Session Equipment Utilized During Treatment: Gait belt  Activity Tolerance: Patient tolerated treatment well Patient left: in chair;with family/visitor present;with call bell/phone within reach (pt getting ready to leave and NSG in to go over DC PW)   Time: [id number] OT Time Calculation (min): 45 min Charges:  OT General Charges $OT Visit: 1 Procedure OT Evaluation $OT Eval Moderate Complexity: 1 Procedure OT Treatments $Self Care/Home Management : 8-22 mins $Therapeutic Activity: 8-22 mins G-Codes: OT G-codes **NOT FOR INPATIENT CLASS** Functional Limitation: Self care Self Care Current Status [id number]): At least 1 percent but less than 20 percent impaired, limited or restricted Self Care Goal Status [id number]): At least 1 percent but less than 20 percent impaired, limited or restricted   Chrys Racer, OTR/L ascom [phone] 01/06/16, 4:02 PM

## 2016-01-08 DIAGNOSIS — G4733 Obstructive sleep apnea (adult) (pediatric): Secondary | ICD-10-CM | POA: Diagnosis not present

## 2016-01-08 DIAGNOSIS — G40109 Localization-related (focal) (partial) symptomatic epilepsy and epileptic syndromes with simple partial seizures, not intractable, without status epilepticus: Secondary | ICD-10-CM | POA: Diagnosis not present

## 2016-01-14 NOTE — Care Management (Signed)
Received telephone call from daughter, Knox Royalty. States that her father was in the hospital Christmas and chose [facility] for Nursing services. Would like [facility] because [facility] had [facility] the past. Will pull information and fax to [facility].  Ms. Danielle Dess states that she told [facility] yesterday to cancelled their services. Shelbie Ammons RN MSN CCM Care Management

## 2016-01-15 NOTE — Progress Notes (Signed)
[facility]  Patient Status: patient refused HH, wants to use another agency, Jeannie Fend, CM and Marshell Garfinkel, CM made aware.       Albert Boone 01/15/2016, 10:32 AM

## 2016-01-17 DIAGNOSIS — Z8673 Personal history of transient ischemic attack (TIA), and cerebral infarction without residual deficits: Secondary | ICD-10-CM | POA: Diagnosis not present

## 2016-01-17 DIAGNOSIS — M545 Low back pain: Secondary | ICD-10-CM | POA: Diagnosis not present

## 2016-01-17 DIAGNOSIS — K219 Gastro-esophageal reflux disease without esophagitis: Secondary | ICD-10-CM | POA: Diagnosis not present

## 2016-01-17 DIAGNOSIS — M6281 Muscle weakness (generalized): Secondary | ICD-10-CM | POA: Diagnosis not present

## 2016-01-17 DIAGNOSIS — N183 Chronic kidney disease, stage 3 (moderate): Secondary | ICD-10-CM | POA: Diagnosis not present

## 2016-01-17 DIAGNOSIS — E785 Hyperlipidemia, unspecified: Secondary | ICD-10-CM | POA: Diagnosis not present

## 2016-01-17 DIAGNOSIS — G4089 Other seizures: Secondary | ICD-10-CM | POA: Diagnosis not present

## 2016-01-17 DIAGNOSIS — I129 Hypertensive chronic kidney disease with stage 1 through stage 4 chronic kidney disease, or unspecified chronic kidney disease: Secondary | ICD-10-CM | POA: Diagnosis not present

## 2016-01-17 DIAGNOSIS — Z7902 Long term (current) use of antithrombotics/antiplatelets: Secondary | ICD-10-CM | POA: Diagnosis not present

## 2016-01-25 NOTE — ED Provider Notes (Signed)
[facility] Emergency Department Provider Note   First MD Initiated Contact with Patient 01/05/16 [phone]     (approximate)  I have reviewed the triage vital signs and the nursing notes.   HISTORY  Chief Complaint Code Stroke   HPI Albert Boone is a 83 y.o. male presents with below list of chronic medical conditions presents with history of generalized shaking seizure-like activity per the patient's wife. Patient's wife states that she awoke at approximately 1:45 AM to find her husband with generalized convulsions. Patient denies any recent illness no cough fever and congestion. Denies any pain at this time or any other complaints.   Past Medical History:  Diagnosis Date  . GERD (gastroesophageal reflux disease)   . Hyperlipemia   . Hypertension   . Prostatitis   . Stroke [facility])    mini strokes    Patient Active Problem List   Diagnosis Date Noted  . Seizure ([place]) 01/05/2016  . Sepsis ([place]) 08/21/2015    Past Surgical History:  Procedure Laterality Date  . SKIN CANCER EXCISION     multiple times    Prior to Admission medications   Medication Sig Start Date End Date Taking? Authorizing Provider  Multiple Vitamins-Minerals (MULTIVITAMIN WITH MINERALS) tablet Take 1 tablet by mouth daily.   Yes Historical Provider, MD  omeprazole (PRILOSEC) 20 MG capsule Take 20 mg by mouth 2 (two) times daily.    Yes Historical Provider, MD  terazosin (HYTRIN) 5 MG capsule Take 1 capsule (5 mg total) by mouth 2 (two) times daily. Patient taking differently: Take 5 mg by mouth daily.  08/18/15  Yes Bjorn Loser, MD  levETIRAcetam (KEPPRA) 500 MG tablet Take 1 tablet (500 mg total) by mouth 2 (two) times daily. 01/06/16   Demetrios Loll, MD    Allergies Penicillins  Family History  Problem Relation Age of Onset  . Hypertension Father   . Prostate cancer Neg Hx   . Hematuria Neg Hx   . Kidney disease Neg Hx     Social History Social History    Substance Use Topics  . Smoking status: Former Smoker    Types: Cigarettes    Quit date: 08/18/1966  . Smokeless tobacco: Never Used  . Alcohol use 2.4 oz/week    4 Glasses of wine per week    Review of Systems Constitutional: No fever/chills Eyes: No visual changes. ENT: No sore throat. Cardiovascular: Denies chest pain. Respiratory: Denies shortness of breath. Gastrointestinal: No abdominal pain.  No nausea, no vomiting.  No diarrhea.  No constipation. Genitourinary: Negative for dysuria. Musculoskeletal: Negative for back pain. Skin: Negative for rash. Neurological: Negative for headaches, focal weakness or numbness.Positive for generalized convulsions  10-point ROS otherwise negative.  ____________________________________________   PHYSICAL EXAM:  VITAL SIGNS: ED Triage Vitals  Enc Vitals Group     BP 01/05/16 0236 (!) 174/87     Pulse Rate 01/05/16 0236 (!) 108     Resp 01/05/16 0236 16     Temp 01/05/16 0236 98 F (36.7 C)     Temp Source 01/05/16 0236 Oral     SpO2 01/05/16 0236 94 %     Weight 01/05/16 0230 162 lb 12.8 oz (73.8 kg)     Height 01/05/16 0230 6' (1.829 m)     Head Circumference --      Peak Flow --      Pain Score --      Pain Loc --  Pain Edu? --      Excl. in [place]? --     Constitutional: Alert and oriented. Well appearing and in no acute distress. Eyes: Conjunctivae are normal. PERRL. EOMI. Head: Atraumatic. Mouth/Throat: Mucous membranes are moist.  Oropharynx non-erythematous. Neck: No stridor.  Cardiovascular: Normal rate, regular rhythm. Good peripheral circulation. Grossly normal heart sounds. Respiratory: Normal respiratory effort.  No retractions. Lungs CTAB. Gastrointestinal: Soft and nontender. No distention.  Musculoskeletal: No lower extremity tenderness nor edema. No gross deformities of extremities. Neurologic:  Normal speech and language. No gross focal neurologic deficits are appreciated.  Skin:  Skin is warm, dry and  intact. No rash noted. Psychiatric: Mood and affect are normal. Speech and behavior are normal.  ____________________________________________   LABS (all labs ordered are listed, but only abnormal results are displayed)  Labs Reviewed  COMPREHENSIVE METABOLIC PANEL - Abnormal; Notable for the following:       Result Value   Glucose, Bld 142 (*)    Creatinine, Ser 1.38 (*)    ALT 11 (*)    GFR calc non Af Amer 43 (*)    GFR calc Af Amer 50 (*)    All other components within normal limits  URINALYSIS, ROUTINE W REFLEX MICROSCOPIC - Abnormal; Notable for the following:    Color, Urine YELLOW (*)    APPearance CLEAR (*)    All other components within normal limits  LIPID PANEL - Abnormal; Notable for the following:    LDL Cholesterol 124 (*)    All other components within normal limits  ETHANOL  PROTIME-INR  APTT  CBC  DIFFERENTIAL  TROPONIN I  URINE DRUG SCREEN, QUALITATIVE ([facility] ONLY)   ____________________________________________  EKG  ED ECG REPORT I, [place] Jonte Woll[place], the attending physician, personally viewed and interpreted this ECG.   Date: 01/25/2016  EKG Time: 2:34 AM  Rate: 107  Rhythm: sinus tachycardia  Axis: Normal  Intervals:normal   ST&T Change: None  ____________________________________________  RADIOLOGY I, [place] N Chailyn Racette, personally viewed and evaluated these images (plain radiographs) as part of my medical decision making, as well as reviewing the written report by the radiologist.  CLINICAL DATA:  Altered mental status  EXAM: CT HEAD WITHOUT CONTRAST  TECHNIQUE: Contiguous axial images were obtained from the base of the skull through the vertex without intravenous contrast.  COMPARISON:  Head CT 08/21/2015  FINDINGS: Brain: No mass lesion, intraparenchymal hemorrhage or extra-axial collection. No evidence of acute cortical infarct. There is periventricular hypoattenuation compatible with chronic microvascular  disease.  Vascular: Atherosclerotic calcification of the internal carotid arteries at the skullbase.  Skull: Normal visualized skull base, calvarium and extracranial soft tissues.  Sinuses/Orbits: No sinus fluid levels or advanced mucosal thickening. No mastoid effusion. Normal orbits.  IMPRESSION: 1. No acute intracranial abnormality. 2. Chronic microvascular ischemia.   Electronically Signed   By: Ulyses Jarred M.D.   On: 01/05/2016 02:55 ____________________________________________   Procedures    INITIAL IMPRESSION / ASSESSMENT AND PLAN / ED COURSE  Pertinent labs & imaging results that were available during my care of the patient were reviewed by me and considered in my medical decision making (see chart for details).  Patient evaluated by [facility] neurology with concern for possible seizure activity/TIA recommendation for MRI and ultrasound carotids.   Clinical Course     ____________________________________________  FINAL CLINICAL IMPRESSION(S) / ED DIAGNOSES  Final diagnoses:  TIA (transient ischemic attack)  SOB (shortness of breath)  Unsteadiness on feet  Muscle weakness (generalized)  MEDICATIONS GIVEN DURING THIS VISIT:  Medications  sodium chloride 0.9 % bolus 500 mL (0 mLs Intravenous Stopped 01/05/16 0426)  aspirin chewable tablet 324 mg (324 mg Oral Given 01/05/16 0332)   stroke: mapping our early stages of recovery book ( Does not apply Given 01/05/16 [id number])  Influenza vac split quadrivalent PF (FLUARIX) injection 0.5 mL (0.5 mLs Intramuscular Given 01/06/16 1247)  gadobenate dimeglumine (MULTIHANCE) injection 10 mL (7 mLs Intravenous Contrast Given 01/05/16 1301)     NEW OUTPATIENT MEDICATIONS STARTED DURING THIS VISIT:  Discharge Medication List as of 01/06/2016  3:25 PM    START taking these medications   Details  levETIRAcetam (KEPPRA) 500 MG tablet Take 1 tablet (500 mg total) by mouth 2 (two) times daily., Starting Tue  01/06/2016, Print        Discharge Medication List as of 01/06/2016  3:25 PM      Discharge Medication List as of 01/06/2016  3:25 PM    STOP taking these medications     levofloxacin (LEVAQUIN) 250 MG tablet Comments:  Reason for Stopping:           Note:  This document was prepared using Dragon voice recognition software and may include unintentional dictation errors.    Gregor Hams, MD 01/25/16 [phone]

## 2016-02-02 DIAGNOSIS — G3184 Mild cognitive impairment, so stated: Secondary | ICD-10-CM | POA: Diagnosis not present

## 2016-02-02 DIAGNOSIS — G40219 Localization-related (focal) (partial) symptomatic epilepsy and epileptic syndromes with complex partial seizures, intractable, without status epilepticus: Secondary | ICD-10-CM | POA: Diagnosis not present

## 2016-02-02 DIAGNOSIS — R569 Unspecified convulsions: Secondary | ICD-10-CM | POA: Diagnosis not present

## 2016-02-02 DIAGNOSIS — K4090 Unilateral inguinal hernia, without obstruction or gangrene, not specified as recurrent: Secondary | ICD-10-CM | POA: Diagnosis not present

## 2016-02-04 ENCOUNTER — Ambulatory Visit: Payer: PPO | Attending: Specialist

## 2016-02-04 DIAGNOSIS — G4761 Periodic limb movement disorder: Secondary | ICD-10-CM | POA: Insufficient documentation

## 2016-02-04 DIAGNOSIS — G4710 Hypersomnia, unspecified: Secondary | ICD-10-CM | POA: Insufficient documentation

## 2016-02-04 DIAGNOSIS — G478 Other sleep disorders: Secondary | ICD-10-CM | POA: Insufficient documentation

## 2016-02-18 DIAGNOSIS — K4090 Unilateral inguinal hernia, without obstruction or gangrene, not specified as recurrent: Secondary | ICD-10-CM | POA: Diagnosis not present

## 2016-02-19 DIAGNOSIS — N183 Chronic kidney disease, stage 3 (moderate): Secondary | ICD-10-CM | POA: Diagnosis not present

## 2016-02-19 DIAGNOSIS — G40219 Localization-related (focal) (partial) symptomatic epilepsy and epileptic syndromes with complex partial seizures, intractable, without status epilepticus: Secondary | ICD-10-CM | POA: Diagnosis not present

## 2016-02-19 DIAGNOSIS — I10 Essential (primary) hypertension: Secondary | ICD-10-CM | POA: Diagnosis not present

## 2016-02-25 NOTE — ED Provider Notes (Signed)
[facility] Emergency Department Provider Note   First MD Initiated Contact with Patient 01/05/16 [phone]     (approximate)  I have reviewed the triage vital signs and the nursing notes.   HISTORY  Chief Complaint Code Stroke   HPI RA KNABE III is a 83 y.o. male with below less of chronic medical conditions including CVA, seizure presents to the emergency department with history of generalized shaking. Per the patient's wife she awoke at 1:45 AM and noticed that the patient had what appeared to be seizure-like activity with generalized shaking. Patient has no recollection of the events. Patient denies any symptoms at this time   Past Medical History:  Diagnosis Date  . GERD (gastroesophageal reflux disease)   . Hyperlipemia   . Hypertension   . Prostatitis   . Stroke [facility])    mini strokes    Patient Active Problem List   Diagnosis Date Noted  . Seizure ([place]) 01/05/2016  . Sepsis ([place]) 08/21/2015    Past Surgical History:  Procedure Laterality Date  . SKIN CANCER EXCISION     multiple times    Prior to Admission medications   Medication Sig Start Date End Date Taking? Authorizing Provider  Multiple Vitamins-Minerals (MULTIVITAMIN WITH MINERALS) tablet Take 1 tablet by mouth daily.   Yes Historical Provider, MD  omeprazole (PRILOSEC) 20 MG capsule Take 20 mg by mouth 2 (two) times daily.    Yes Historical Provider, MD  terazosin (HYTRIN) 5 MG capsule Take 1 capsule (5 mg total) by mouth 2 (two) times daily. Patient taking differently: Take 5 mg by mouth daily.  08/18/15  Yes Bjorn Loser, MD  levETIRAcetam (KEPPRA) 500 MG tablet Take 1 tablet (500 mg total) by mouth 2 (two) times daily. 01/06/16   Demetrios Loll, MD    Allergies Penicillins  Family History  Problem Relation Age of Onset  . Hypertension Father   . Prostate cancer Neg Hx   . Hematuria Neg Hx   . Kidney disease Neg Hx     Social History Social History  Substance  Use Topics  . Smoking status: Former Smoker    Types: Cigarettes    Quit date: 08/18/1966  . Smokeless tobacco: Never Used  . Alcohol use 2.4 oz/week    4 Glasses of wine per week    Review of Systems Constitutional: No fever/chills Eyes: No visual changes. ENT: No sore throat. Cardiovascular: Denies chest pain. Respiratory: Denies shortness of breath. Gastrointestinal: No abdominal pain.  No nausea, no vomiting.  No diarrhea.  No constipation. Genitourinary: Negative for dysuria. Musculoskeletal: Negative for back pain. Skin: Negative for rash. Neurological: Negative for headaches, focal weakness or numbness. History of generalized shaking  10-point ROS otherwise negative.  ____________________________________________   PHYSICAL EXAM:  VITAL SIGNS: ED Triage Vitals  Enc Vitals Group     BP 01/05/16 0236 (!) 174/87     Pulse Rate 01/05/16 0236 (!) 108     Resp 01/05/16 0236 16     Temp 01/05/16 0236 98 F (36.7 C)     Temp Source 01/05/16 0236 Oral     SpO2 01/05/16 0236 94 %     Weight 01/05/16 0230 162 lb 12.8 oz (73.8 kg)     Height 01/05/16 0230 6' (1.829 m)     Head Circumference --      Peak Flow --      Pain Score --      Pain Loc --  Pain Edu? --      Excl. in [place]? --     Constitutional: Alert and oriented. Well appearing and in no acute distress. Eyes: Conjunctivae are normal. PERRL. EOMI. Head: Atraumatic. Nose: No congestion/rhinnorhea. Mouth/Throat: Mucous membranes are moist Neck: No stridor.  No meningeal signs.  Cardiovascular: Normal rate, regular rhythm. Good peripheral circulation. Grossly normal heart sounds. Respiratory: Normal respiratory effort.  No retractions. Lungs CTAB. Gastrointestinal: Soft and nontender. No distention.  Musculoskeletal: No lower extremity tenderness nor edema. No gross deformities of extremities. Neurologic:  Normal speech and language. No gross focal neurologic deficits are appreciated.  Skin:  Skin is warm,  dry and intact. No rash noted. Psychiatric: Mood and affect are normal. Speech and behavior are normal.  ____________________________________________   LABS (all labs ordered are listed, but only abnormal results are displayed)  Labs Reviewed  COMPREHENSIVE METABOLIC PANEL - Abnormal; Notable for the following:       Result Value   Glucose, Bld 142 (*)    Creatinine, Ser 1.38 (*)    ALT 11 (*)    GFR calc non Af Amer 43 (*)    GFR calc Af Amer 50 (*)    All other components within normal limits  URINALYSIS, ROUTINE W REFLEX MICROSCOPIC - Abnormal; Notable for the following:    Color, Urine YELLOW (*)    APPearance CLEAR (*)    All other components within normal limits  LIPID PANEL - Abnormal; Notable for the following:    LDL Cholesterol 124 (*)    All other components within normal limits  ETHANOL  PROTIME-INR  APTT  CBC  DIFFERENTIAL  TROPONIN I  URINE DRUG SCREEN, QUALITATIVE ([facility] ONLY)     RADIOLOGY I, [place] N BROWN, personally viewed and evaluated these images (plain radiographs) as part of my medical decision making, as well as reviewing the written report by the radiologist.  CLINICAL DATA:  Altered mental status  EXAM: CT HEAD WITHOUT CONTRAST  TECHNIQUE: Contiguous axial images were obtained from the base of the skull through the vertex without intravenous contrast.  COMPARISON:  Head CT 08/21/2015  FINDINGS: Brain: No mass lesion, intraparenchymal hemorrhage or extra-axial collection. No evidence of acute cortical infarct. There is periventricular hypoattenuation compatible with chronic microvascular disease.  Vascular: Atherosclerotic calcification of the internal carotid arteries at the skullbase.  Skull: Normal visualized skull base, calvarium and extracranial soft tissues.  Sinuses/Orbits: No sinus fluid levels or advanced mucosal thickening. No mastoid effusion. Normal orbits.  IMPRESSION: 1. No acute intracranial  abnormality. 2. Chronic microvascular ischemia.   Electronically Signed   By: Ulyses Jarred M.D.   On: 01/05/2016 02:55 ____________________________________________  ED ECG REPORT I, [place] N BROWN, the attending physician, personally viewed and interpreted this ECG.   Date: 02/25/2016  EKG Time: 2:34 AM  Rate: 107  Rhythm: Sinus tachycardia  Axis: Normal  Intervals: Normal  ST&T Change: None    Procedures     INITIAL IMPRESSION / ASSESSMENT AND PLAN / ED COURSE  Pertinent labs & imaging results that were available during my care of the patient were reviewed by me and considered in my medical decision making (see chart for details).  Patient with history of physical exam concerning for possible tonic-clonic seizure. Patient was evaluated by specialist on call neurology who recommended hospital admission for further evaluation possible CVA/seizure.      ____________________________________________  FINAL CLINICAL IMPRESSION(S) / ED DIAGNOSES  Final diagnoses:  TIA (transient ischemic attack)  SOB (shortness of  breath)  Unsteadiness on feet  Muscle weakness (generalized)     MEDICATIONS GIVEN DURING THIS VISIT:  Medications  sodium chloride 0.9 % bolus 500 mL (0 mLs Intravenous Stopped 01/05/16 0426)  aspirin chewable tablet 324 mg (324 mg Oral Given 01/05/16 0332)   stroke: mapping our early stages of recovery book ( Does not apply Given 01/05/16 [id number])  Influenza vac split quadrivalent PF (FLUARIX) injection 0.5 mL (0.5 mLs Intramuscular Given 01/06/16 1247)  gadobenate dimeglumine (MULTIHANCE) injection 10 mL (7 mLs Intravenous Contrast Given 01/05/16 1301)     NEW OUTPATIENT MEDICATIONS STARTED DURING THIS VISIT:  Discharge Medication List as of 01/06/2016  3:25 PM    START taking these medications   Details  levETIRAcetam (KEPPRA) 500 MG tablet Take 1 tablet (500 mg total) by mouth 2 (two) times daily., Starting Tue 01/06/2016, Print         Discharge Medication List as of 01/06/2016  3:25 PM      Discharge Medication List as of 01/06/2016  3:25 PM    STOP taking these medications     levofloxacin (LEVAQUIN) 250 MG tablet Comments:  Reason for Stopping:           Note:  This document was prepared using Dragon voice recognition software and may include unintentional dictation errors.\    Gregor Hams, MD 02/25/16 2257

## 2016-03-11 DIAGNOSIS — R569 Unspecified convulsions: Secondary | ICD-10-CM | POA: Diagnosis not present

## 2016-04-01 DIAGNOSIS — R2681 Unsteadiness on feet: Secondary | ICD-10-CM | POA: Diagnosis not present

## 2016-04-01 DIAGNOSIS — G40219 Localization-related (focal) (partial) symptomatic epilepsy and epileptic syndromes with complex partial seizures, intractable, without status epilepticus: Secondary | ICD-10-CM | POA: Diagnosis not present

## 2016-04-01 DIAGNOSIS — G3184 Mild cognitive impairment, so stated: Secondary | ICD-10-CM | POA: Diagnosis not present

## 2016-04-08 DIAGNOSIS — E782 Mixed hyperlipidemia: Secondary | ICD-10-CM | POA: Diagnosis not present

## 2016-04-08 DIAGNOSIS — N183 Chronic kidney disease, stage 3 (moderate): Secondary | ICD-10-CM | POA: Diagnosis not present

## 2016-04-16 DIAGNOSIS — N183 Chronic kidney disease, stage 3 (moderate): Secondary | ICD-10-CM | POA: Diagnosis not present

## 2016-04-16 DIAGNOSIS — G40219 Localization-related (focal) (partial) symptomatic epilepsy and epileptic syndromes with complex partial seizures, intractable, without status epilepticus: Secondary | ICD-10-CM | POA: Diagnosis not present

## 2016-04-16 DIAGNOSIS — Z0000 Encounter for general adult medical examination without abnormal findings: Secondary | ICD-10-CM | POA: Diagnosis not present

## 2016-04-16 DIAGNOSIS — E782 Mixed hyperlipidemia: Secondary | ICD-10-CM | POA: Diagnosis not present

## 2016-05-07 DIAGNOSIS — H26492 Other secondary cataract, left eye: Secondary | ICD-10-CM | POA: Diagnosis not present

## 2016-06-01 DIAGNOSIS — Z8673 Personal history of transient ischemic attack (TIA), and cerebral infarction without residual deficits: Secondary | ICD-10-CM | POA: Diagnosis not present

## 2016-06-01 DIAGNOSIS — G3184 Mild cognitive impairment, so stated: Secondary | ICD-10-CM | POA: Diagnosis not present

## 2016-06-01 DIAGNOSIS — G40219 Localization-related (focal) (partial) symptomatic epilepsy and epileptic syndromes with complex partial seizures, intractable, without status epilepticus: Secondary | ICD-10-CM | POA: Diagnosis not present

## 2016-06-04 DIAGNOSIS — M5441 Lumbago with sciatica, right side: Secondary | ICD-10-CM | POA: Diagnosis not present

## 2016-06-04 DIAGNOSIS — M9903 Segmental and somatic dysfunction of lumbar region: Secondary | ICD-10-CM | POA: Diagnosis not present

## 2016-06-04 DIAGNOSIS — M461 Sacroiliitis, not elsewhere classified: Secondary | ICD-10-CM | POA: Diagnosis not present

## 2016-06-04 DIAGNOSIS — M9904 Segmental and somatic dysfunction of sacral region: Secondary | ICD-10-CM | POA: Diagnosis not present

## 2016-06-08 DIAGNOSIS — M9904 Segmental and somatic dysfunction of sacral region: Secondary | ICD-10-CM | POA: Diagnosis not present

## 2016-06-08 DIAGNOSIS — M5441 Lumbago with sciatica, right side: Secondary | ICD-10-CM | POA: Diagnosis not present

## 2016-06-08 DIAGNOSIS — M9903 Segmental and somatic dysfunction of lumbar region: Secondary | ICD-10-CM | POA: Diagnosis not present

## 2016-06-08 DIAGNOSIS — M461 Sacroiliitis, not elsewhere classified: Secondary | ICD-10-CM | POA: Diagnosis not present

## 2016-06-30 DIAGNOSIS — K591 Functional diarrhea: Secondary | ICD-10-CM | POA: Diagnosis not present

## 2016-06-30 DIAGNOSIS — R634 Abnormal weight loss: Secondary | ICD-10-CM | POA: Diagnosis not present

## 2016-08-10 DIAGNOSIS — R197 Diarrhea, unspecified: Secondary | ICD-10-CM | POA: Diagnosis not present

## 2016-08-10 DIAGNOSIS — R634 Abnormal weight loss: Secondary | ICD-10-CM | POA: Diagnosis not present

## 2016-09-06 DIAGNOSIS — H353114 Nonexudative age-related macular degeneration, right eye, advanced atrophic with subfoveal involvement: Secondary | ICD-10-CM | POA: Diagnosis not present

## 2016-10-12 DIAGNOSIS — E782 Mixed hyperlipidemia: Secondary | ICD-10-CM | POA: Diagnosis not present

## 2016-10-12 DIAGNOSIS — I10 Essential (primary) hypertension: Secondary | ICD-10-CM | POA: Diagnosis not present

## 2016-10-12 DIAGNOSIS — N183 Chronic kidney disease, stage 3 (moderate): Secondary | ICD-10-CM | POA: Diagnosis not present

## 2016-10-19 DIAGNOSIS — G40219 Localization-related (focal) (partial) symptomatic epilepsy and epileptic syndromes with complex partial seizures, intractable, without status epilepticus: Secondary | ICD-10-CM | POA: Diagnosis not present

## 2016-10-19 DIAGNOSIS — Z79899 Other long term (current) drug therapy: Secondary | ICD-10-CM | POA: Diagnosis not present

## 2016-10-19 DIAGNOSIS — Z0000 Encounter for general adult medical examination without abnormal findings: Secondary | ICD-10-CM | POA: Diagnosis not present

## 2016-10-19 DIAGNOSIS — E782 Mixed hyperlipidemia: Secondary | ICD-10-CM | POA: Diagnosis not present

## 2016-10-19 DIAGNOSIS — N183 Chronic kidney disease, stage 3 (moderate): Secondary | ICD-10-CM | POA: Diagnosis not present

## 2016-10-19 DIAGNOSIS — Z23 Encounter for immunization: Secondary | ICD-10-CM | POA: Diagnosis not present

## 2016-11-26 DIAGNOSIS — L720 Epidermal cyst: Secondary | ICD-10-CM | POA: Diagnosis not present

## 2016-11-26 DIAGNOSIS — D485 Neoplasm of uncertain behavior of skin: Secondary | ICD-10-CM | POA: Diagnosis not present

## 2016-11-26 DIAGNOSIS — Z85820 Personal history of malignant melanoma of skin: Secondary | ICD-10-CM | POA: Diagnosis not present

## 2016-11-26 DIAGNOSIS — L570 Actinic keratosis: Secondary | ICD-10-CM | POA: Diagnosis not present

## 2016-11-26 DIAGNOSIS — C44519 Basal cell carcinoma of skin of other part of trunk: Secondary | ICD-10-CM | POA: Diagnosis not present

## 2016-11-26 DIAGNOSIS — Z1283 Encounter for screening for malignant neoplasm of skin: Secondary | ICD-10-CM | POA: Diagnosis not present

## 2016-11-26 DIAGNOSIS — L821 Other seborrheic keratosis: Secondary | ICD-10-CM | POA: Diagnosis not present

## 2016-11-26 DIAGNOSIS — Z85828 Personal history of other malignant neoplasm of skin: Secondary | ICD-10-CM | POA: Diagnosis not present

## 2017-03-09 DIAGNOSIS — G40109 Localization-related (focal) (partial) symptomatic epilepsy and epileptic syndromes with simple partial seizures, not intractable, without status epilepticus: Secondary | ICD-10-CM | POA: Diagnosis not present

## 2017-03-09 DIAGNOSIS — E785 Hyperlipidemia, unspecified: Secondary | ICD-10-CM | POA: Diagnosis not present

## 2017-03-09 DIAGNOSIS — Z8673 Personal history of transient ischemic attack (TIA), and cerebral infarction without residual deficits: Secondary | ICD-10-CM | POA: Diagnosis not present

## 2017-03-09 DIAGNOSIS — I951 Orthostatic hypotension: Secondary | ICD-10-CM | POA: Diagnosis not present

## 2017-03-09 DIAGNOSIS — Z85820 Personal history of malignant melanoma of skin: Secondary | ICD-10-CM | POA: Diagnosis not present

## 2017-03-09 DIAGNOSIS — R471 Dysarthria and anarthria: Secondary | ICD-10-CM | POA: Diagnosis not present

## 2017-03-09 DIAGNOSIS — Z8249 Family history of ischemic heart disease and other diseases of the circulatory system: Secondary | ICD-10-CM | POA: Diagnosis not present

## 2017-03-09 DIAGNOSIS — Z7982 Long term (current) use of aspirin: Secondary | ICD-10-CM | POA: Diagnosis not present

## 2017-03-09 DIAGNOSIS — Z7902 Long term (current) use of antithrombotics/antiplatelets: Secondary | ICD-10-CM | POA: Diagnosis not present

## 2017-03-09 DIAGNOSIS — N183 Chronic kidney disease, stage 3 (moderate): Secondary | ICD-10-CM | POA: Diagnosis not present

## 2017-03-09 DIAGNOSIS — I129 Hypertensive chronic kidney disease with stage 1 through stage 4 chronic kidney disease, or unspecified chronic kidney disease: Secondary | ICD-10-CM | POA: Diagnosis not present

## 2017-03-09 DIAGNOSIS — Z86010 Personal history of colonic polyps: Secondary | ICD-10-CM | POA: Diagnosis not present

## 2017-03-09 DIAGNOSIS — Z9889 Other specified postprocedural states: Secondary | ICD-10-CM | POA: Diagnosis not present

## 2017-03-09 DIAGNOSIS — Z79899 Other long term (current) drug therapy: Secondary | ICD-10-CM | POA: Diagnosis not present

## 2017-03-09 DIAGNOSIS — K210 Gastro-esophageal reflux disease with esophagitis: Secondary | ICD-10-CM | POA: Diagnosis not present

## 2017-03-09 DIAGNOSIS — Z961 Presence of intraocular lens: Secondary | ICD-10-CM | POA: Diagnosis not present

## 2017-03-09 DIAGNOSIS — H353134 Nonexudative age-related macular degeneration, bilateral, advanced atrophic with subfoveal involvement: Secondary | ICD-10-CM | POA: Diagnosis not present

## 2017-03-09 DIAGNOSIS — Z87891 Personal history of nicotine dependence: Secondary | ICD-10-CM | POA: Diagnosis not present

## 2017-03-09 DIAGNOSIS — R531 Weakness: Secondary | ICD-10-CM | POA: Diagnosis not present

## 2017-03-09 DIAGNOSIS — G459 Transient cerebral ischemic attack, unspecified: Secondary | ICD-10-CM | POA: Diagnosis not present

## 2017-03-09 DIAGNOSIS — R4781 Slurred speech: Secondary | ICD-10-CM | POA: Diagnosis not present

## 2017-03-09 DIAGNOSIS — N400 Enlarged prostate without lower urinary tract symptoms: Secondary | ICD-10-CM | POA: Diagnosis not present

## 2017-03-09 DIAGNOSIS — R569 Unspecified convulsions: Secondary | ICD-10-CM | POA: Diagnosis not present

## 2017-03-09 DIAGNOSIS — H35373 Puckering of macula, bilateral: Secondary | ICD-10-CM | POA: Diagnosis not present

## 2017-03-09 DIAGNOSIS — H3530 Unspecified macular degeneration: Secondary | ICD-10-CM | POA: Diagnosis not present

## 2017-03-10 DIAGNOSIS — E782 Mixed hyperlipidemia: Secondary | ICD-10-CM | POA: Diagnosis not present

## 2017-03-10 DIAGNOSIS — R471 Dysarthria and anarthria: Secondary | ICD-10-CM | POA: Diagnosis not present

## 2017-03-10 DIAGNOSIS — R531 Weakness: Secondary | ICD-10-CM | POA: Diagnosis not present

## 2017-03-10 DIAGNOSIS — G459 Transient cerebral ischemic attack, unspecified: Secondary | ICD-10-CM | POA: Diagnosis not present

## 2017-03-10 DIAGNOSIS — R569 Unspecified convulsions: Secondary | ICD-10-CM | POA: Diagnosis not present

## 2017-03-10 DIAGNOSIS — I10 Essential (primary) hypertension: Secondary | ICD-10-CM | POA: Diagnosis not present

## 2017-03-11 DIAGNOSIS — I10 Essential (primary) hypertension: Secondary | ICD-10-CM | POA: Diagnosis not present

## 2017-03-11 DIAGNOSIS — E782 Mixed hyperlipidemia: Secondary | ICD-10-CM | POA: Diagnosis not present

## 2017-03-11 DIAGNOSIS — G459 Transient cerebral ischemic attack, unspecified: Secondary | ICD-10-CM | POA: Diagnosis not present

## 2017-03-16 DIAGNOSIS — G459 Transient cerebral ischemic attack, unspecified: Secondary | ICD-10-CM | POA: Diagnosis not present

## 2017-03-16 DIAGNOSIS — E782 Mixed hyperlipidemia: Secondary | ICD-10-CM | POA: Diagnosis not present

## 2017-04-08 DIAGNOSIS — G459 Transient cerebral ischemic attack, unspecified: Secondary | ICD-10-CM | POA: Diagnosis not present

## 2017-04-12 DIAGNOSIS — Z79899 Other long term (current) drug therapy: Secondary | ICD-10-CM | POA: Diagnosis not present

## 2017-04-12 DIAGNOSIS — E782 Mixed hyperlipidemia: Secondary | ICD-10-CM | POA: Diagnosis not present

## 2017-04-19 DIAGNOSIS — G40219 Localization-related (focal) (partial) symptomatic epilepsy and epileptic syndromes with complex partial seizures, intractable, without status epilepticus: Secondary | ICD-10-CM | POA: Diagnosis not present

## 2017-04-19 DIAGNOSIS — G459 Transient cerebral ischemic attack, unspecified: Secondary | ICD-10-CM | POA: Diagnosis not present

## 2017-04-19 DIAGNOSIS — Z0000 Encounter for general adult medical examination without abnormal findings: Secondary | ICD-10-CM | POA: Diagnosis not present

## 2017-04-20 ENCOUNTER — Observation Stay
Admission: EM | Admit: 2017-04-20 | Discharge: 2017-04-22 | Disposition: A | Discharge status: Skilled Nursing Facility | Payer: PPO | Attending: Internal Medicine | Admitting: Internal Medicine

## 2017-04-20 ENCOUNTER — Observation Stay: Payer: PPO

## 2017-04-20 ENCOUNTER — Emergency Department: Payer: PPO

## 2017-04-20 ENCOUNTER — Other Ambulatory Visit: Payer: Self-pay

## 2017-04-20 DIAGNOSIS — N183 Chronic kidney disease, stage 3 (moderate): Secondary | ICD-10-CM | POA: Insufficient documentation

## 2017-04-20 DIAGNOSIS — G40909 Epilepsy, unspecified, not intractable, without status epilepticus: Secondary | ICD-10-CM | POA: Insufficient documentation

## 2017-04-20 DIAGNOSIS — I129 Hypertensive chronic kidney disease with stage 1 through stage 4 chronic kidney disease, or unspecified chronic kidney disease: Secondary | ICD-10-CM | POA: Insufficient documentation

## 2017-04-20 DIAGNOSIS — R1310 Dysphagia, unspecified: Secondary | ICD-10-CM | POA: Diagnosis not present

## 2017-04-20 DIAGNOSIS — G459 Transient cerebral ischemic attack, unspecified: Secondary | ICD-10-CM | POA: Diagnosis not present

## 2017-04-20 DIAGNOSIS — E785 Hyperlipidemia, unspecified: Secondary | ICD-10-CM | POA: Diagnosis not present

## 2017-04-20 DIAGNOSIS — K219 Gastro-esophageal reflux disease without esophagitis: Secondary | ICD-10-CM | POA: Insufficient documentation

## 2017-04-20 DIAGNOSIS — N400 Enlarged prostate without lower urinary tract symptoms: Secondary | ICD-10-CM | POA: Diagnosis not present

## 2017-04-20 DIAGNOSIS — F0390 Unspecified dementia without behavioral disturbance: Secondary | ICD-10-CM | POA: Diagnosis not present

## 2017-04-20 DIAGNOSIS — Z79899 Other long term (current) drug therapy: Secondary | ICD-10-CM | POA: Diagnosis not present

## 2017-04-20 DIAGNOSIS — Z85828 Personal history of other malignant neoplasm of skin: Secondary | ICD-10-CM | POA: Insufficient documentation

## 2017-04-20 DIAGNOSIS — Z7982 Long term (current) use of aspirin: Secondary | ICD-10-CM | POA: Insufficient documentation

## 2017-04-20 DIAGNOSIS — Z7902 Long term (current) use of antithrombotics/antiplatelets: Secondary | ICD-10-CM | POA: Insufficient documentation

## 2017-04-20 DIAGNOSIS — R531 Weakness: Secondary | ICD-10-CM | POA: Diagnosis not present

## 2017-04-20 DIAGNOSIS — M6281 Muscle weakness (generalized): Secondary | ICD-10-CM | POA: Diagnosis not present

## 2017-04-20 DIAGNOSIS — I639 Cerebral infarction, unspecified: Secondary | ICD-10-CM | POA: Diagnosis not present

## 2017-04-20 DIAGNOSIS — R4781 Slurred speech: Secondary | ICD-10-CM | POA: Diagnosis not present

## 2017-04-20 DIAGNOSIS — Z87891 Personal history of nicotine dependence: Secondary | ICD-10-CM | POA: Insufficient documentation

## 2017-04-20 DIAGNOSIS — R29810 Facial weakness: Secondary | ICD-10-CM | POA: Diagnosis present

## 2017-04-20 DIAGNOSIS — R29818 Other symptoms and signs involving the nervous system: Secondary | ICD-10-CM | POA: Diagnosis not present

## 2017-04-20 DIAGNOSIS — I6521 Occlusion and stenosis of right carotid artery: Secondary | ICD-10-CM | POA: Diagnosis not present

## 2017-04-20 HISTORY — DX: Unspecified dementia, unspecified severity, without behavioral disturbance, psychotic disturbance, mood disturbance, and anxiety: F03.90

## 2017-04-20 LAB — CBC
HCT: 39.5 % — ABNORMAL LOW (ref 40.0–52.0)
HEMOGLOBIN: 13.2 g/dL (ref 13.0–18.0)
MCH: 30.3 pg (ref 26.0–34.0)
MCHC: 33.3 g/dL (ref 32.0–36.0)
MCV: 91 fL (ref 80.0–100.0)
Platelets: 153 10*3/uL (ref 150–440)
RBC: 4.34 MIL/uL — ABNORMAL LOW (ref 4.40–5.90)
RDW: 14.1 % (ref 11.5–14.5)
WBC: 5.5 10*3/uL (ref 3.8–10.6)

## 2017-04-20 LAB — URINALYSIS, COMPLETE (UACMP) WITH MICROSCOPIC
BILIRUBIN URINE: NEGATIVE
Bacteria, UA: NONE SEEN
GLUCOSE, UA: NEGATIVE mg/dL
HGB URINE DIPSTICK: NEGATIVE
Ketones, ur: NEGATIVE mg/dL
LEUKOCYTES UA: NEGATIVE
NITRITE: NEGATIVE
Protein, ur: NEGATIVE mg/dL
Specific Gravity, Urine: 1.017 (ref 1.005–1.030)
Squamous Epithelial / LPF: NONE SEEN
pH: 5 (ref 5.0–8.0)

## 2017-04-20 LAB — COMPREHENSIVE METABOLIC PANEL
ALK PHOS: 49 U/L (ref 38–126)
ALT: 10 U/L — ABNORMAL LOW (ref 17–63)
ANION GAP: 7 (ref 5–15)
AST: 19 U/L (ref 15–41)
Albumin: 3.7 g/dL (ref 3.5–5.0)
BILIRUBIN TOTAL: 0.6 mg/dL (ref 0.3–1.2)
BUN: 16 mg/dL (ref 6–20)
CO2: 26 mmol/L (ref 22–32)
Calcium: 8.6 mg/dL — ABNORMAL LOW (ref 8.9–10.3)
Chloride: 106 mmol/L (ref 101–111)
Creatinine, Ser: 1.23 mg/dL (ref 0.61–1.24)
GFR calc non Af Amer: 49 mL/min — ABNORMAL LOW (ref >60)
GFR, EST AFRICAN AMERICAN: 57 mL/min — AB (ref >60)
GLUCOSE: 127 mg/dL — AB (ref 65–99)
POTASSIUM: 3.8 mmol/L (ref 3.5–5.1)
SODIUM: 139 mmol/L (ref 135–145)
TOTAL PROTEIN: 7.4 g/dL (ref 6.5–8.1)

## 2017-04-20 LAB — TROPONIN I: Troponin I: <0.03 ng/mL (ref <0.03)

## 2017-04-20 MED ORDER — LEVETIRACETAM 250 MG PO TABS
500.0000 mg | ORAL_TABLET | Freq: Every evening | ORAL | Status: DC
  Administered 2017-04-20 – 2017-04-22 (×3): 500 mg via ORAL
  Filled 2017-04-20: qty 1
  Filled 2017-04-20 (×2): qty 2

## 2017-04-20 MED ORDER — ACETAMINOPHEN 325 MG PO TABS: 650.0000 mg | ORAL_TABLET | ORAL | Status: DC | PRN

## 2017-04-20 MED ORDER — QUETIAPINE FUMARATE 25 MG PO TABS
25.0000 mg | ORAL_TABLET | Freq: Once | ORAL | Status: AC
  Administered 2017-04-21: 25 mg via ORAL
  Filled 2017-04-20: qty 1

## 2017-04-20 MED ORDER — PANTOPRAZOLE SODIUM 40 MG PO TBEC
40.0000 mg | DELAYED_RELEASE_TABLET | Freq: Every day | ORAL | Status: DC
  Administered 2017-04-20 – 2017-04-22 (×3): 40 mg via ORAL
  Filled 2017-04-20 (×3): qty 1

## 2017-04-20 MED ORDER — STROKE: EARLY STAGES OF RECOVERY BOOK
Freq: Once | Status: AC
  Administered 2017-04-20: 18:00:00

## 2017-04-20 MED ORDER — SODIUM CHLORIDE 0.9 % IV SOLN
INTRAVENOUS | Status: DC
  Administered 2017-04-20 (×2): via INTRAVENOUS

## 2017-04-20 MED ORDER — ASPIRIN EC 81 MG PO TBEC
81.0000 mg | DELAYED_RELEASE_TABLET | Freq: Every day | ORAL | Status: DC
  Administered 2017-04-20 – 2017-04-22 (×3): 81 mg via ORAL
  Filled 2017-04-20 (×3): qty 1

## 2017-04-20 MED ORDER — ACETAMINOPHEN 650 MG RE SUPP: 650.0000 mg | RECTAL | Status: DC | PRN

## 2017-04-20 MED ORDER — ADULT MULTIVITAMIN W/MINERALS CH
1.0000 | ORAL_TABLET | Freq: Every day | ORAL | Status: DC
  Administered 2017-04-21 – 2017-04-22 (×2): 1 via ORAL
  Filled 2017-04-20 (×4): qty 1

## 2017-04-20 MED ORDER — ENOXAPARIN SODIUM 40 MG/0.4ML ~~LOC~~ SOLN
40.0000 mg | SUBCUTANEOUS | Status: DC
  Administered 2017-04-20 – 2017-04-22 (×3): 40 mg via SUBCUTANEOUS
  Filled 2017-04-20 (×3): qty 0.4

## 2017-04-20 MED ORDER — ACETAMINOPHEN 160 MG/5ML PO SOLN
650.0000 mg | ORAL | Status: DC | PRN
  Filled 2017-04-20: qty 20.3

## 2017-04-20 MED ORDER — ATORVASTATIN CALCIUM 20 MG PO TABS
10.0000 mg | ORAL_TABLET | Freq: Every day | ORAL | Status: DC
  Administered 2017-04-20 – 2017-04-22 (×3): 10 mg via ORAL
  Filled 2017-04-20 (×3): qty 1

## 2017-04-20 MED ORDER — CLOPIDOGREL BISULFATE 75 MG PO TABS
75.0000 mg | ORAL_TABLET | Freq: Every day | ORAL | Status: DC
  Administered 2017-04-20 – 2017-04-22 (×3): 75 mg via ORAL
  Filled 2017-04-20 (×3): qty 1

## 2017-04-20 MED ORDER — TERAZOSIN HCL 5 MG PO CAPS
5.0000 mg | ORAL_CAPSULE | Freq: Every day | ORAL | Status: DC
  Administered 2017-04-21 – 2017-04-22 (×2): 5 mg via ORAL
  Filled 2017-04-20 (×3): qty 1

## 2017-04-20 NOTE — Care Management Obs Status (Signed)
[facility] NOTIFICATION   Patient Details  Name: Albert Boone MRN: [id number] Date of Birth: 05-18-1922   Medicare Observation Status Notification Given:  Yes    Marshell Garfinkel, RN 04/20/2017, 9:38 AM

## 2017-04-20 NOTE — ED Notes (Signed)
Pt returned from radiology.

## 2017-04-20 NOTE — Progress Notes (Signed)
Family Meeting Note  Advance Directive:yes  Today a meeting took place with the Patient.and spouse    The following clinical team members were present during this meeting:MD  The following were discussed:Patient's diagnosis: tia, Patient's progosis: Unable to determine and Goals for treatment: Full Code  Additional follow-up to be provided: FULL CODE no changes on advanced directives Consider palliative care outpatient follow up on discharge.  Time spent during discussion:16 minutes  Yandell Mcjunkins, MD

## 2017-04-20 NOTE — H&P (Signed)
[facility] [place] NAME: Albert Boone    MR#:  [id number]  DATE OF BIRTH:  1922-10-06  DATE OF ADMISSION:  04/20/2017  PRIMARY CARE PHYSICIAN: Rusty Aus, MD   REQUESTING/REFERRING PHYSICIAN:  Dr brown  CHIEF COMPLAINT:   Another TIA HISTORY OF PRESENT ILLNESS:  Albert Boone  is a 84 y.o. male with a known history of TIA and essential hypertension who presents via EMS due to concern of TIA.  Wife reports that approximately 1:30 AM patient was trying to get out of bed and she noticed that patient was shaking and had garbled speech.  His symptoms are similar to the previous 2 TIAs in the past few months. When EMS arrived patient had right-sided facial droop and slurred speech and right arm weakness that has resolved prior to arriving to the emergency room.  He had a 30-day event recorder and wife reports that they got the results yesterday from [facility] cardiology and there was no heart arrhythmia noted.   PAST MEDICAL HISTORY:   Past Medical History:  Diagnosis Date  . GERD (gastroesophageal reflux disease)   . Hyperlipemia   . Hypertension   . Prostatitis   . Stroke [facility])    mini strokes    PAST SURGICAL HISTORY:   Past Surgical History:  Procedure Laterality Date  . SKIN CANCER EXCISION     multiple times    SOCIAL HISTORY:   Social History   Tobacco Use  . Smoking status: Former Smoker    Types: Cigarettes    Last attempt to quit: 08/18/1966    Years since quitting: 50.7  . Smokeless tobacco: Never Used  Substance Use Topics  . Alcohol use: Yes    Alcohol/week: 2.4 oz    Types: 4 Glasses of wine per week    FAMILY HISTORY:   Family History  Problem Relation Age of Onset  . Hypertension Father   . Prostate cancer Neg Hx   . Hematuria Neg Hx   . Kidney disease Neg Hx     DRUG ALLERGIES:   Allergies  Allergen Reactions  . Penicillins Rash    Has patient had a PCN reaction causing immediate  rash, facial/tongue/throat swelling, SOB or lightheadedness with hypotension: Yes Has patient had a PCN reaction causing severe rash involving mucus membranes or skin necrosis: No Has patient had a PCN reaction that required hospitalization No Has patient had a PCN reaction occurring within the last 10 years: No If all of the above answers are "NO", then may proceed with Cephalosporin use.     REVIEW OF SYSTEMS:   Review of Systems  Constitutional: Negative.  Negative for chills, fever and malaise/fatigue.  HENT: Negative.  Negative for ear discharge, ear pain, hearing loss, nosebleeds and sore throat.   Eyes: Negative.  Negative for blurred vision and pain.  Respiratory: Negative.  Negative for cough, hemoptysis, shortness of breath and wheezing.   Cardiovascular: Negative.  Negative for chest pain, palpitations and leg swelling.  Gastrointestinal: Negative.  Negative for abdominal pain, blood in stool, diarrhea, nausea and vomiting.  Genitourinary: Negative.  Negative for dysuria.  Musculoskeletal: Negative.  Negative for back pain.  Skin: Negative.   Neurological: Positive for tremors, speech change and focal weakness. Negative for dizziness, seizures and headaches.  Endo/Heme/Allergies: Negative.  Does not bruise/bleed easily.  Psychiatric/Behavioral: Negative.  Negative for depression, hallucinations and suicidal ideas.    MEDICATIONS AT HOME:   Prior to Admission medications  Medication Sig Start Date End Date Taking? Authorizing Provider  aspirin EC 81 MG tablet Take 81 mg by mouth daily.   Yes [provider]  Multiple Vitamins-Minerals (MULTIVITAMIN WITH MINERALS) tablet Take 1 tablet by mouth daily.   Yes [provider]  atorvastatin (LIPITOR) 20 MG tablet Take 10 mg by mouth daily.    [provider]  clopidogrel (PLAVIX) 75 MG tablet Take 75 mg by mouth daily.    [provider]  levETIRAcetam (KEPPRA) 500 MG tablet Take 1 tablet (500  mg total) by mouth 2 (two) times daily. Patient taking differently: Take 500 mg by mouth every evening.  01/06/16   Demetrios Loll, MD  loperamide (IMODIUM) 2 MG capsule Take 2 mg by mouth 4 (four) times daily as needed for diarrhea or loose stools.    [provider]  pantoprazole (PROTONIX) 40 MG tablet Take 40 mg by mouth daily.    [provider]  terazosin (HYTRIN) 5 MG capsule Take 1 capsule (5 mg total) by mouth 2 (two) times daily. Patient taking differently: Take 5 mg by mouth daily.  08/18/15   Bjorn Loser, MD      VITAL SIGNS:  Blood pressure (!) 162/82, pulse 93, temperature 98.2 F (36.8 C), temperature source Oral, resp. rate (!) 21, height 5\' 8"  (1.727 m), weight 73.9 kg (163 lb), SpO2 94 %.  PHYSICAL EXAMINATION:   Physical Exam  Constitutional: He is well-developed, well-nourished, and in no distress. No distress.  HENT:  Head: Normocephalic.  Eyes: No scleral icterus.  Neck: Normal range of motion. Neck supple. No JVD present. No tracheal deviation present.  Cardiovascular: Normal rate, regular rhythm and normal heart sounds. Exam reveals no gallop and no friction rub.  No murmur heard. Pulmonary/Chest: Effort normal and breath sounds normal. No respiratory distress. He has no wheezes. He has no rales. He exhibits no tenderness.  Abdominal: Soft. Bowel sounds are normal. He exhibits no distension and no mass. There is no tenderness. There is no rebound and no guarding.  Musculoskeletal: Normal range of motion. He exhibits no edema.  Neurological: He is alert. He displays abnormal reflex. No cranial nerve deficit. He exhibits normal muscle tone. Coordination normal.  Oriented to name and place  Skin: Skin is warm. No rash noted. No erythema.  Psychiatric: Affect and judgment normal.      LABORATORY PANEL:   CBC Recent Labs  Lab 04/20/17 0226  WBC 5.5  HGB 13.2  HCT 39.5*  PLT 153    ------------------------------------------------------------------------------------------------------------------  Chemistries  Recent Labs  Lab 04/20/17 0226  NA 139  K 3.8  CL 106  CO2 26  GLUCOSE 127*  BUN 16  CREATININE 1.23  CALCIUM 8.6*  AST 19  ALT 10*  ALKPHOS 49  BILITOT 0.6   ------------------------------------------------------------------------------------------------------------------  Cardiac Enzymes Recent Labs  Lab 04/20/17 0226  TROPONINI <0.03   ------------------------------------------------------------------------------------------------------------------  RADIOLOGY:  Ct Head Wo Contrast  Result Date: 04/20/2017 CLINICAL DATA:  Stroke-like symptoms. Patient was flail E arms with slurred speech and left-sided weakness this morning. EXAM: CT HEAD WITHOUT CONTRAST TECHNIQUE: Contiguous axial images were obtained from the base of the skull through the vertex without intravenous contrast. COMPARISON:  None. FINDINGS: Brain: Atrophy with chronic small vessel ischemia. No large vascular territory infarct, hemorrhage or midline shift. Loss of gray-white matter distinction. No intra-axial mass nor extra-axial fluid collections. Midline fourth ventricle basal cisterns. Vascular: Moderate atherosclerosis of left vertebral artery and both cavernous sinus carotids. No hyperdense  vessel sign. Skull: No acute skull fracture. Sinuses/Orbits: Bilateral cataract extractions. Clear paranasal sinuses and mastoid air cells. Other: None IMPRESSION: Atrophy with chronic small vessel ischemia. No acute hemorrhage. No acute intracranial abnormality. Electronically Signed   By: Ashley Royalty M.D.   On: 04/20/2017 03:26    EKG:   Sinus tachycardia heart rate 107 no ST elevation or depression  IMPRESSION AND PLAN:   84 year old male with history of TIA who presents with facial droop and garbled speech.  1.  TIA: Patient symptoms have now improved Continue aspirin and  Plavix Order MRI and carotid Doppler. Patient had echocardiogram in March and therefore will not reorder. PT, OT, speech and neurology consultation requested Continue statin Continue neurochecks  2.  BPH: Continue Hytrin  3.  Hyperlipidemia: Continue statin  4. CKD stage 3: Stable  All the records are reviewed and case discussed with ED provider. Management plans discussed with the patient and wife and they are in agreement  CODE STATUS: Full  TOTAL TIME TAKING CARE OF THIS PATIENT: 40 minutes.    Elayjah Chaney M.D on 04/20/2017 at 7:56 AM  Between 7am to 6pm - Pager - [phone]  After 6pm go to [email or web address] - password [email or web address] [facility]  Office  [phone]  CC: Primary care physician; Rusty Aus, MD

## 2017-04-20 NOTE — ED Provider Notes (Signed)
[facility] Emergency Department Provider Note    First MD Initiated Contact with Patient 04/20/17 0230     (approximate)  I have reviewed the triage vital signs and the nursing notes.   HISTORY  Chief Complaint Stroke Symptoms    HPI Albert Albert is a 84 y.o. male below list of chronic medical condition including previous CVA presents to the emergency department via EMS with concern for possible "TIA".  Per EMS on their arrival the patient had marked right-sided facial droop, slurred speech as well as right arm weakness however that is resolved at this time.  EMS states that the patient's wife states that he awoke abruptly and asked her for help.  Patient's wife states that both her and her husband went to bed at 8:00 AM.   Past Medical History:  Diagnosis Date  . GERD (gastroesophageal reflux disease)   . Hyperlipemia   . Hypertension   . Prostatitis   . Stroke [facility])    mini strokes    Patient Active Problem List   Diagnosis Date Noted  . Seizure ([place]) 01/05/2016  . Sepsis ([place]) 08/21/2015    Past Surgical History:  Procedure Laterality Date  . SKIN CANCER EXCISION     multiple times    Prior to Admission medications   Medication Sig Start Date End Date Taking? Authorizing Provider  aspirin EC 81 MG tablet Take 81 mg by mouth daily.    [provider]  atorvastatin (LIPITOR) 20 MG tablet Take 10 mg by mouth daily.    [provider]  clopidogrel (PLAVIX) 75 MG tablet Take 75 mg by mouth daily.    [provider]  levETIRAcetam (KEPPRA) 500 MG tablet Take 1 tablet (500 mg total) by mouth 2 (two) times daily. Patient taking differently: Take 500 mg by mouth every evening.  01/06/16   Demetrios Loll, MD  loperamide (IMODIUM) 2 MG capsule Take 2 mg by mouth 4 (four) times daily as needed for diarrhea or loose stools.    [provider]  Multiple Vitamins-Minerals (MULTIVITAMIN WITH MINERALS) tablet  Take 1 tablet by mouth daily.    [provider]  pantoprazole (PROTONIX) 40 MG tablet Take 40 mg by mouth daily.    [provider]  terazosin (HYTRIN) 5 MG capsule Take 1 capsule (5 mg total) by mouth 2 (two) times daily. Patient taking differently: Take 5 mg by mouth daily.  08/18/15   Bjorn Loser, MD    Allergies Penicillins  Family History  Problem Relation Age of Onset  . Hypertension Father   . Prostate cancer Neg Hx   . Hematuria Neg Hx   . Kidney disease Neg Hx     Social History Social History   Tobacco Use  . Smoking status: Former Smoker    Types: Cigarettes    Last attempt to quit: 08/18/1966    Years since quitting: 50.7  . Smokeless tobacco: Never Used  Substance Use Topics  . Alcohol use: Yes    Alcohol/week: 2.4 oz    Types: 4 Glasses of wine per week  . Drug use: No    Review of Systems Constitutional: No fever/chills Eyes: No visual changes. ENT: No sore throat. Cardiovascular: Denies chest pain. Respiratory: Denies shortness of breath. Gastrointestinal: No abdominal pain.  No nausea, no vomiting.  No diarrhea.  No constipation. Genitourinary: Negative for dysuria. Musculoskeletal: Negative for neck pain.  Negative for back pain. Integumentary: Negative for rash. Neurological: Negative for  headaches, focal weakness or numbness. History of right facial droop, slurred speech, right arm weakness (now resolved)   ____________________________________________   PHYSICAL EXAM:  VITAL SIGNS: ED Triage Vitals  Enc Vitals Group     BP 04/20/17 0221 (!) 148/99     Pulse Rate 04/20/17 0230 99     Resp 04/20/17 0221 18     Temp 04/20/17 0221 98.2 F (36.8 C)     Temp Source 04/20/17 0221 Oral     SpO2 04/20/17 0221 91 %     Weight 04/20/17 0222 73.9 kg (163 lb)     Height 04/20/17 0222 1.727 m (5\' 8" )     Head Circumference --      Peak Flow --      Pain Score 04/20/17 0222 0     Pain Loc --      Pain Edu? --      Excl. in  [place]? --     Constitutional: Alert and oriented. Well appearing and in no acute distress. Eyes: Conjunctivae are normal.  Head: Atraumatic. Mouth/Throat: Mucous membranes are moist.  Oropharynx non-erythematous. Neck: No stridor.  Cardiovascular: Normal rate, regular rhythm. Good peripheral circulation. Grossly normal heart sounds. Respiratory: Normal respiratory effort.  No retractions. Lungs CTAB. Gastrointestinal: Soft and nontender. No distention.  Musculoskeletal: No lower extremity tenderness nor edema. No gross deformities of extremities. Neurologic:  Normal speech and language. No gross focal neurologic deficits are appreciated.  Skin:  Skin is warm, dry and intact. No rash noted. Psychiatric: Mood and affect are normal. Speech and behavior are normal.  ____________________________________________   LABS (all labs ordered are listed, but only abnormal results are displayed)  Labs Reviewed  CBC - Abnormal; Notable for the following components:      Result Value   RBC 4.34 (*)    HCT 39.5 (*)    All other components within normal limits  COMPREHENSIVE METABOLIC PANEL - Abnormal; Notable for the following components:   Glucose, Bld 127 (*)    Calcium 8.6 (*)    ALT 10 (*)    GFR calc non Af Amer 49 (*)    GFR calc Af Amer 57 (*)    All other components within normal limits  URINALYSIS, COMPLETE (UACMP) WITH MICROSCOPIC - Abnormal; Notable for the following components:   Color, Urine YELLOW (*)    APPearance CLEAR (*)    All other components within normal limits  TROPONIN I   ____________________________________________  EKG  ED ECG REPORT I, Albert N Cynthea Zachman, the attending physician, personally viewed and interpreted this ECG.   Date: 04/20/2017  EKG Time: 2:16 AM  Rate: 107  Rhythm: Sinus tachycardia  Axis: Normal  Intervals: Normal  ST&T Change: None  ____________________________________________  RADIOLOGY I, [place] Ernst Bowler, personally viewed and  evaluated these images (plain radiographs) as part of my medical decision making, as well as reviewing the written report by the radiologist.  ED MD interpretation:    Official radiology report(s): Ct Head Wo Contrast  Result Date: 04/20/2017 CLINICAL DATA:  Stroke-like symptoms. Patient was flail E arms with slurred speech and left-sided weakness this morning. EXAM: CT HEAD WITHOUT CONTRAST TECHNIQUE: Contiguous axial images were obtained from the base of the skull through the vertex without intravenous contrast. COMPARISON:  None. FINDINGS: Brain: Atrophy with chronic small vessel ischemia. No large vascular territory infarct, hemorrhage or midline shift. Loss of gray-white matter distinction. No intra-axial mass nor extra-axial fluid collections. Midline fourth ventricle basal cisterns. Vascular: Moderate  atherosclerosis of left vertebral artery and both cavernous sinus carotids. No hyperdense vessel sign. Skull: No acute skull fracture. Sinuses/Orbits: Bilateral cataract extractions. Clear paranasal sinuses and mastoid air cells. Other: None IMPRESSION: Atrophy with chronic small vessel ischemia. No acute hemorrhage. No acute intracranial abnormality. Electronically Signed   By: Ashley Royalty M.D.   On: 04/20/2017 03:26     Procedures   ____________________________________________   INITIAL IMPRESSION / ASSESSMENT AND PLAN / ED COURSE  As part of my medical decision making, I reviewed the following data within the electronic MEDICAL RECORD NUMBER   84 year old male presenting with above-stated history and physical exam with concern for possible transient ischemic attack.  All symptoms are resolved at this time CT scan of the head revealed no acute intracranial abnormality per the radiologist. ____________________________________________  FINAL CLINICAL IMPRESSION(S) / ED DIAGNOSES  Final diagnoses:  TIA (transient ischemic attack)     MEDICATIONS GIVEN DURING THIS VISIT:  Medications -  No data to display   ED Discharge Orders    None       Note:  This document was prepared using Dragon voice recognition software and may include unintentional dictation errors.    Gregor Hams, MD 04/20/17 [phone]

## 2017-04-20 NOTE — ED Notes (Signed)
Pt notified that he had assigned bed. Pt and wife state understanding. Will continue to monitor for further patient needs.

## 2017-04-20 NOTE — ED Notes (Signed)
Pt given urinal to use the bathroom.

## 2017-04-20 NOTE — Care Management (Signed)
RNCM met with patient and his wife to discuss [place] letter.  He states his PCP Dr. Emily Filbert has arranged home health services through patient preference [facility]- [facility].  Wife did state that "some other agency called them at home but she told them she wanted [place] with [place]".  Patient will need orders for home health at discharge. He typically walks independently but states he has a cane and walker available for use if needed.

## 2017-04-20 NOTE — ED Notes (Signed)
SLP at bedside at this time to evaluate patient.

## 2017-04-20 NOTE — ED Notes (Signed)
Pt transported to MRI 

## 2017-04-20 NOTE — ED Triage Notes (Signed)
Pt arrived via EMS from home with stroke like symptoms. Pt last known well at 8:00pm tonight. Pt is alert and oriented. Per EMS pts wife kissed pt goodnight and woke this AM to pt flailing arms, slurred speech and left sided weakness. Pt with slurred speech, no drift noted, bilateral stregth equal. MD at bedside.

## 2017-04-20 NOTE — Evaluation (Addendum)
Clinical/Bedside Swallow Evaluation Patient Details  Name: Albert Boone MRN: [id number] Date of Birth: 22-Oct-1922  Today's Date: 04/20/2017 Time: SLP Start Time (ACUTE ONLY): 11 SLP Stop Time (ACUTE ONLY): 1630 SLP Time Calculation (min) (ACUTE ONLY): 60 min  Past Medical History:  Past Medical History:  Diagnosis Date  . GERD (gastroesophageal reflux disease)   . Hyperlipemia   . Hypertension   . Prostatitis   . Stroke [facility])    mini strokes   Past Surgical History:  Past Surgical History:  Procedure Laterality Date  . SKIN CANCER EXCISION     multiple times   HPI:  PMH includes seziures - being followed by Neurology (who also felt pt could have vascular dementia d/t the strokes, per chart notes). Head CT revealed Atrophy with chronic small vessel ischemia; MRI No acute infarction, hemorrhage, hydrocephalus or mass lesion. NSG reported pt choked when attempting to swallow a pill w/ water earlier. Upon further asking, pt and Wife stated he feels it's easier to swallow his "small" pills at home but the "larger ones are harder even cut in half". Wife also endorsed mild throat clearing intermittently.    Assessment / Plan / Recommendation Clinical Impression  Pt appears to present w/ mild s/s of Dsyphagia; no overt oral phase deficits but min pharyngeal phase deficits were noted during po intake at this eval today. This can increase pt's risk for aspiration and pulmonary decline from such. Pt's Wife stated she is aware of pt having difficulty swallowing "some" pills at home and "some" mild throat clearing when drinking liquids at home. Pt consumed trials of thin liquids via Cup w/ mild throat clearing noted in between multiple sips of the water; no coughing. Pt was educated then on single, small sips more slowly w/ rest break in between as needed - no further overt s/s of dysphagia noted. Pt's vocal quality was fairly clear w/ no decline in respiratory status or O2 sats  durnig/post trials. Oral phase was grossly wfl for bolus management and oral clearing w/ all trials; pt stated the cracker was "easier" softened in applesauce. OM exam revealed no gross unilateral weakness in lingual/labial movements. Due to pt's presentation w/ Pills w/ Water earlier w/ Nursing and his presentation w/ po trials during this evaluation, recommend a modified diet of Dysphagia level 3 w/ thin liquids via Cup - NO Straws. Recommend Pills in puree - Whole as able but Crushed as needed or in alternative forms. Recommend general aspiration precautions and reducing distractions during meals. Due to pt's min declined Cognitive status, pt would benefit from min monitoring at meals for follow through/support w/ general aspiration precautions.  SLP Visit Diagnosis: Dysphagia, oropharyngeal phase (R13.12)    Aspiration Risk  Mild aspiration risk(but reduced following precautions)    Diet Recommendation  Dysphagia level 3 (mech soft) w/ Thin liquids via CUP - NO STRAWS. General aspiration precautions; reduce distractions during meals.   Medication Administration: Whole meds with puree(for easier, safer swallowing or Crushed if needed/able)  Alternative forms such as chewables can be considered.   Other  Recommendations Recommended Consults: (Dietician f/u) Oral Care Recommendations: Oral care BID;Patient independent with oral care Other Recommendations: (n/a at this time)   Follow up Recommendations None(TBD)      Frequency and Duration min 2x/week  1 week       Prognosis Prognosis for Safe Diet Advancement: Fair(Good) Barriers to Reach Goals: Cognitive deficits(Deconditioned; advanced age)      Swallow Study   General Date  of Onset: 04/20/17 HPI: PMH includes seziures - being followed by Neurology (who also felt pt could have vascular dementia d/t the strokes, per chart notes). Head CT revealed Atrophy with chronic small vessel ischemia; MRI No acute infarction, hemorrhage,  hydrocephalus or mass lesion. NSG reported pt choked when attempting to swallow a pill w/ water earlier. Upon further asking, pt and Wife stated he feels it's easier to swallow his "small" pills at home but the "larger ones are harder even cut in half". Wife also endorsed mild throat clearing intermittently.  Type of Study: Bedside Swallow Evaluation Previous Swallow Assessment: none reported Diet Prior to this Study: Regular;Thin liquids(at home) Temperature Spikes Noted: No(wbc 5.5) Respiratory Status: Room air History of Recent Intubation: No Behavior/Cognition: Alert;Cooperative;Pleasant mood;Distractible;Requires cueing Oral Cavity Assessment: Within Functional Limits Oral Care Completed by SLP: Recent completion by staff Oral Cavity - Dentition: Adequate natural dentition;Missing dentition(few) Vision: Functional for self-feeding(does have some macular degeneration per wife) Self-Feeding Abilities: Able to feed self;Needs assist;Needs set up Patient Positioning: Upright in bed Baseline Vocal Quality: Normal Volitional Cough: Strong Volitional Swallow: Able to elicit    Oral/Motor/Sensory Function Overall Oral Motor/Sensory Function: Within functional limits(grossly)   Ice Chips Ice chips: Within functional limits Presentation: Spoon(fed; 3 trials)   Thin Liquid Thin Liquid: Impaired Presentation: Cup;Self Fed Oral Phase Impairments: (none) Oral Phase Functional Implications: (none) Pharyngeal  Phase Impairments: Throat Clearing - Delayed(x1 in between multiple swallows of thin liquids via cup) Other Comments: pt was educated then on single, small sips more slowly w/ rest break in between as needed    Nectar Thick Nectar Thick Liquid: Not tested   Honey Thick Honey Thick Liquid: Not tested   Puree Puree: Within functional limits Presentation: Spoon;Self Fed(5 trials)   Solid   GO   Solid: Within functional limits(w/ mech soft boluses) Presentation: Self Fed(3 trials)          Orinda Kenner, MS, CCC-SLP Cynethia Schindler 04/20/2017,6:06 PM

## 2017-04-21 ENCOUNTER — Encounter: Payer: Self-pay | Admitting: Internal Medicine

## 2017-04-21 DIAGNOSIS — N400 Enlarged prostate without lower urinary tract symptoms: Secondary | ICD-10-CM | POA: Diagnosis not present

## 2017-04-21 DIAGNOSIS — G459 Transient cerebral ischemic attack, unspecified: Secondary | ICD-10-CM

## 2017-04-21 DIAGNOSIS — E785 Hyperlipidemia, unspecified: Secondary | ICD-10-CM | POA: Diagnosis not present

## 2017-04-21 LAB — LIPID PANEL
CHOL/HDL RATIO: 2.3 ratio
CHOLESTEROL: 129 mg/dL (ref 0–200)
HDL: 56 mg/dL (ref >40)
LDL Cholesterol: 56 mg/dL (ref 0–99)
TRIGLYCERIDES: 84 mg/dL (ref <150)
VLDL: 17 mg/dL (ref 0–40)

## 2017-04-21 LAB — HEMOGLOBIN A1C
Hgb A1c MFr Bld: 5.6 % (ref 4.8–5.6)
Mean Plasma Glucose: 114.02 mg/dL

## 2017-04-21 MED ORDER — LORAZEPAM 2 MG/ML IJ SOLN
INTRAMUSCULAR | Status: AC
  Administered 2017-04-21: 1 mg
  Filled 2017-04-21: qty 1

## 2017-04-21 MED ORDER — LORAZEPAM 2 MG/ML IJ SOLN: 1.0000 mg | Freq: Once | INTRAMUSCULAR | Status: DC

## 2017-04-21 NOTE — Plan of Care (Signed)
  Problem: Education: Goal: Knowledge of General Education information will improve Outcome: Not Progressing   Problem: Health Behavior/Discharge Planning: Goal: Ability to manage health-related needs will improve Outcome: Not Progressing   Problem: Clinical Measurements: Goal: Ability to maintain clinical measurements within normal limits will improve Outcome: Progressing Goal: Will remain free from infection Outcome: Progressing Goal: Diagnostic test results will improve Outcome: Progressing Goal: Respiratory complications will improve Outcome: Progressing Goal: Cardiovascular complication will be avoided Outcome: Progressing   Problem: Activity: Goal: Risk for activity intolerance will decrease Outcome: Progressing   Problem: Nutrition: Goal: Adequate nutrition will be maintained Outcome: Progressing   Problem: Coping: Goal: Level of anxiety will decrease Outcome: Progressing   Problem: Elimination: Goal: Will not experience complications related to bowel motility Outcome: Progressing Goal: Will not experience complications related to urinary retention Outcome: Progressing   Problem: Pain Managment: Goal: General experience of comfort will improve Outcome: Progressing   Problem: Safety: Goal: Ability to remain free from injury will improve Outcome: Not Progressing   Problem: Skin Integrity: Goal: Risk for impaired skin integrity will decrease Outcome: Progressing   Problem: Education: Goal: Knowledge of secondary prevention will improve Outcome: Not Progressing Goal: Knowledge of patient specific risk factors addressed and post discharge goals established will improve Outcome: Not Progressing

## 2017-04-21 NOTE — NC FL2 (Signed)
[place] MEDICAID FL2 LEVEL OF CARE SCREENING TOOL     IDENTIFICATION  Patient Name: Albert Boone Birthdate: [birth year] Sex: male Admission Date (Current Location): 04/20/2017  [place] and [place] Number:  Engineering geologist and Address:  [facility], [address] [place], [place] [address]      Provider Number: [id number]  Attending Physician Name and Address:  Bettey Costa, MD  Relative Name and Phone Number:  [place], Einstein [phone]  [phone] or Gus Puma Daughter   [phone] or Malachi IV,Fred Son   [phone]     Current Level of Care: Hospital Recommended Level of Care: [place] Prior Approval Number:    Date Approved/Denied:   PASRR Number: [id number]  Discharge Plan: SNF    Current Diagnoses: Patient Active Problem List   Diagnosis Date Noted  . TIA (transient ischemic attack) 04/20/2017  . Seizure ([place]) 01/05/2016  . Sepsis ([place]) 08/21/2015    Orientation RESPIRATION BLADDER Height & Weight     Self  Normal Incontinent Weight: 146 lb 3.2 oz (66.3 kg) Height:  5\' 8"  (172.7 cm)  BEHAVIORAL SYMPTOMS/MOOD NEUROLOGICAL BOWEL NUTRITION STATUS    Convulsions/Seizures Incontinent Diet(Dysphagia 3 diet)  AMBULATORY STATUS COMMUNICATION OF NEEDS Skin   Limited Assist Verbally Normal                       Personal Care Assistance Level of Assistance  Bathing, Feeding, Dressing Bathing Assistance: Limited assistance Feeding assistance: Independent Dressing Assistance: Limited assistance     Functional Limitations Info  Sight, Hearing, Speech Sight Info: Adequate Hearing Info: Adequate Speech Info: Adequate    SPECIAL CARE FACTORS FREQUENCY  PT (By licensed PT), OT (By licensed OT)     PT Frequency: 5x a week OT Frequency: 5x a week            Contractures Contractures Info: Not present    Additional Factors Info  Code Status, Allergies, Psychotropic Code  Status Info: Full Code Allergies Info: Penicillins Psychotropic Info: LORazepam (ATIVAN) injection 1 mg          Current Medications (04/21/2017):  This is the current hospital active medication list Current Facility-Administered Medications  Medication Dose Route Frequency Provider Last Rate Last Dose  . 0.9 %  sodium chloride infusion   Intravenous Continuous Bettey Costa, MD 75 mL/hr at 04/20/17 2358    . acetaminophen (TYLENOL) tablet 650 mg  650 mg Oral Q4H PRN Bettey Costa, MD       Or  . acetaminophen (TYLENOL) solution 650 mg  650 mg Per Tube Q4H PRN Mody, Sital, MD       Or  . acetaminophen (TYLENOL) suppository 650 mg  650 mg Rectal Q4H PRN Mody, Sital, MD      . aspirin EC tablet 81 mg  81 mg Oral Daily Mody, Sital, MD   81 mg at 04/21/17 1019  . atorvastatin (LIPITOR) tablet 10 mg  10 mg Oral Daily Bettey Costa, MD   10 mg at 04/21/17 1019  . clopidogrel (PLAVIX) tablet 75 mg  75 mg Oral Daily Bettey Costa, MD   75 mg at 04/21/17 1019  . enoxaparin (LOVENOX) injection 40 mg  40 mg Subcutaneous Q24H Bettey Costa, MD   40 mg at 04/20/17 1415  . levETIRAcetam (KEPPRA) tablet 500 mg  500 mg Oral QPM Bettey Costa, MD   500 mg at 04/20/17 1826  . LORazepam (ATIVAN) injection 1 mg  1 mg Intravenous  Once Amelia Jo, MD      . multivitamin with minerals tablet 1 tablet  1 tablet Oral Daily Bettey Costa, MD   1 tablet at 04/21/17 1019  . pantoprazole (PROTONIX) EC tablet 40 mg  40 mg Oral Daily Mody, Sital, MD   40 mg at 04/21/17 1019  . terazosin (HYTRIN) capsule 5 mg  5 mg Oral Daily Bettey Costa, MD   5 mg at 04/21/17 1019     Discharge Medications: Please see discharge summary for a list of discharge medications.  Relevant Imaging Results:  Relevant Lab Results:   Additional Information SSN [id number]  Ross Ludwig, [place]

## 2017-04-21 NOTE — Evaluation (Signed)
Physical Therapy Evaluation Patient Details Name: Albert Boone MRN: [id number] DOB: [birth year] Today's Date: 04/21/2017   History of Present Illness  84 year old male with history of BPH TIAs and seizure disorder with underlying dementia (undiagnosed) who presented with garbled speech, R facial droop, and RUE weakness. Neuro work up negative for acute stroke.  Clinical Impression  Pt showed good effort with PT exam and was actually able to walk better than expected.  However he still needed a lot of assist with mobility and sit to stand, showed poor balance, safety awareness and generally is not appropriate to return home at this time.  Pt uses cane at baseline and was very reliant on walker for balance/safety today.  Pt fatigued with the effort.  He will require short term rehab once medically ready for discharge.    Follow Up Recommendations SNF    Equipment Recommendations  None recommended by PT    Recommendations for Other Services       Precautions / Restrictions Precautions Precautions: Fall Restrictions Weight Bearing Restrictions: No      Mobility  Bed Mobility Overal bed mobility: Needs Assistance Bed Mobility: Supine to Sit     Supine to sit: Mod assist     General bed mobility comments: Pt needed signficant assist to get to sitting at EOB despite indicating he could do so w/o assist  Transfers Overall transfer level: Needs assistance Equipment used: Rolling walker (2 wheeled) Transfers: Sit to/from Stand Sit to Stand: Mod assist         General transfer comment: Pt unable to shift weight forward onto walker initially and needed heavy assist to keep from falling back.  With cuing and assist manipulating walker this improved  Ambulation/Gait Ambulation/Gait assistance: Min assist Ambulation Distance (Feet): 55 Feet Assistive device: Rolling walker (2 wheeled)       General Gait Details: Pt initially with very short, choppy, hesitant steps.  HR in the 110s, with light assist to advance walker he was able to improve cadence somewhat.    Stairs            Wheelchair Mobility    Modified Rankin (Stroke Patients Only)       Balance Overall balance assessment: Needs assistance   Sitting balance-Leahy Scale: Fair       Standing balance-Leahy Scale: Poor                               Pertinent Vitals/Pain Pain Assessment: No/denies pain Faces Pain Scale: No hurt    Home Living Family/patient expects to be discharged to:: Skilled nursing facility Living Arrangements: Spouse/significant other Available Help at Discharge: Family;Available PRN/intermittently Type of Home: House Home Access: Level entry     Home Layout: One level Home Equipment: Shower seat - built in;Grab bars - tub/shower;Grab bars - toilet;Walker - 2 wheels;Cane - single point      Prior Function Level of Independence: Needs assistance   Gait / Transfers Assistance Needed: ambulating with SPC, at least 2 falls in past 12 months (LOB, decreased safety awareness)  ADL's / Homemaking Assistance Needed: Generally independent with basic ADL per family report (not entirely sure, spouse may assist, very "doting"), eats most meals out with spouse, family assists with driving and groceries; had housekeeper but family doesn't think they have been using the housekeeper recently        Hand Dominance   Dominant Hand: Right    Extremity/Trunk Assessment  Upper Extremity Assessment Upper Extremity Assessment: Generalized weakness    Lower Extremity Assessment Lower Extremity Assessment: Generalized weakness       Communication   Communication: HOH  Cognition Arousal/Alertness: Lethargic Behavior During Therapy: Flat affect Overall Cognitive Status: History of cognitive impairments - at baseline                                 General Comments: Pt very lethargic/fatigued, difficulty keeping eyes open with max  verbal and tactile cues. Oriented to self but unable to determine additional orientation due to pt falling back asleep      General Comments      Exercises Other Exercises Other Exercises: Caregiver/family education/training provided in basic dementia care strategies to minimize pt's falls risk, maximize safety, and maximize independence and participation in ADL and social situations. Educated in meaningful activities to support quality of life.    Assessment/Plan    PT Assessment Patient needs continued PT services  PT Problem List Decreased strength;Decreased range of motion;Decreased activity tolerance;Decreased balance;Decreased mobility;Decreased coordination;Decreased cognition;Decreased knowledge of use of DME;Decreased safety awareness;Decreased knowledge of precautions;Cardiopulmonary status limiting activity       PT Treatment Interventions DME instruction;Gait training;Stair training;Functional mobility training;Therapeutic activities;Therapeutic exercise;Balance training;Neuromuscular re-education;Cognitive remediation;Patient/family education    PT Goals (Current goals can be found in the Care Plan section)  Acute Rehab PT Goals Patient Stated Goal: family looking to transition to ALF PT Goal Formulation: With family Time For Goal Achievement: 05/05/17 Potential to Achieve Goals: Fair    Frequency Min 2X/week   Barriers to discharge        Co-evaluation               AM-PAC PT "6 Clicks" Daily Activity  Outcome Measure Difficulty turning over in bed (including adjusting bedclothes, sheets and blankets)?: Unable Difficulty moving from lying on back to sitting on the side of the bed? : Unable Difficulty sitting down on and standing up from a chair with arms (e.g., wheelchair, bedside commode, etc,.)?: Unable Help needed moving to and from a bed to chair (including a wheelchair)?: A Little Help needed walking in hospital room?: A Little Help needed climbing  3-5 steps with a railing? : A Lot 6 Click Score: 11    End of Session Equipment Utilized During Treatment: Gait belt Activity Tolerance: Patient limited by fatigue Patient left: with chair alarm set;with call bell/phone within reach;with family/visitor present   PT Visit Diagnosis: Unsteadiness on feet (R26.81);Muscle weakness (generalized) (M62.81);Difficulty in walking, not elsewhere classified (R26.2)    Time: 1030-1100 PT Time Calculation (min) (ACUTE ONLY): 30 min   Charges:   PT Evaluation $PT Eval Low Complexity: 1 Low PT Treatments $Gait Training: 8-22 mins   PT G Codes:        Kreg Shropshire, DPT 04/21/2017, 12:21 PM

## 2017-04-21 NOTE — Discharge Summary (Signed)
[facility] [place] NAME: Albert Boone    MR#:  [id number]  [place]:  05-May-1922  DATE OF ADMISSION:  04/20/2017 ADMITTING PHYSICIAN: Bettey Costa, MD  DATE OF DISCHARGE: 04/21/2017  PRIMARY CARE PHYSICIAN: Rusty Aus, MD    ADMISSION DIAGNOSIS:  TIA (transient ischemic attack) [G45.9] Stroke (cerebrum) ([facility]) [I63.9]  DISCHARGE DIAGNOSIS:  Active Problems:   TIA (transient ischemic attack)   SECONDARY DIAGNOSIS:   Past Medical History:  Diagnosis Date  . Dementia   . GERD (gastroesophageal reflux disease)   . Hyperlipemia   . Hypertension   . Prostatitis   . Stroke [facility])    mini strokes    HOSPITAL COURSE:   84 year old male with history of BPH TIAs and seizure disorder with underlying dementia (undiagnosed) who presented with garbled speech.  1.  TIA: Patient has had several TIAs in the past.  He underwent CVA workup which essentially was negative for acute stroke. He will continue on aspirin, Plavix and statin  2.  History of seizure disorder: Continue Keppra He will follow-up with Dr. Manuella Ghazi his neurologist in 1-2 weeks. 3.  BPH: Continue Hytrin   DISCHARGE CONDITIONS AND DIET:   Stable condition  Dysphagia level 3 (mech soft) w/ Thin liquids via CUP - NO STRAWS. General aspiration precautions; reduce distractions during meals.   Medication Administration: Whole meds with puree(for easier, safer swallowing or Crushed if needed/able)  Alternative forms such as chewables can be considered  CONSULTS OBTAINED:  Treatment Team:  Catarina Hartshorn, MD Alexis Goodell, MD  DRUG ALLERGIES:   Allergies  Allergen Reactions  . Penicillins Rash    Has patient had a PCN reaction causing immediate rash, facial/tongue/throat swelling, SOB or lightheadedness with hypotension: Yes Has patient had a PCN reaction causing severe rash involving mucus membranes or skin necrosis: No Has patient had a PCN reaction  that required hospitalization No Has patient had a PCN reaction occurring within the last 10 years: No If all of the above answers are "NO", then may proceed with Cephalosporin use.     DISCHARGE MEDICATIONS:   Allergies as of 04/21/2017      Reactions   Penicillins Rash   Has patient had a PCN reaction causing immediate rash, facial/tongue/throat swelling, SOB or lightheadedness with hypotension: Yes Has patient had a PCN reaction causing severe rash involving mucus membranes or skin necrosis: No Has patient had a PCN reaction that required hospitalization No Has patient had a PCN reaction occurring within the last 10 years: No If all of the above answers are "NO", then may proceed with Cephalosporin use.      Medication List    TAKE these medications   aspirin EC 81 MG tablet Take 81 mg by mouth daily.   atorvastatin 10 MG tablet Commonly known as:  LIPITOR Take 10 mg by mouth daily.   clopidogrel 75 MG tablet Commonly known as:  PLAVIX Take 75 mg by mouth daily.   levETIRAcetam 500 MG tablet Commonly known as:  KEPPRA Take 1 tablet (500 mg total) by mouth 2 (two) times daily. What changed:  when to take this   loperamide 2 MG capsule Commonly known as:  IMODIUM Take 2 mg by mouth 4 (four) times daily as needed for diarrhea or loose stools.   multivitamin with minerals tablet Take 1 tablet by mouth daily.   pantoprazole 40 MG tablet Commonly known as:  PROTONIX Take 40 mg by mouth daily.  terazosin 5 MG capsule Commonly known as:  HYTRIN Take 1 capsule (5 mg total) by mouth 2 (two) times daily. What changed:  when to take this         Today   CHIEF COMPLAINT:   Patient with agitation and confusion overnight.  Family is at bedside   VITAL SIGNS:  Blood pressure (!) 157/91, pulse 80, temperature (!) 97.5 F (36.4 C), temperature source Oral, resp. rate 18, height 5\' 8"  (1.727 m), weight 66.3 kg (146 lb 3.2 oz), SpO2 95 %.   REVIEW OF  SYSTEMS:  Review of Systems  Unable to perform ROS: Dementia     PHYSICAL EXAMINATION:  GENERAL:  84 y.o.-year-old patient lying in the bed with no acute distress.  NECK:  Supple, no jugular venous distention. No thyroid enlargement, no tenderness.  LUNGS: Normal breath sounds bilaterally, no wheezing, rales,rhonchi  No use of accessory muscles of respiration.  CARDIOVASCULAR: S1, S2 normal. No murmurs, rubs, or gallops.  ABDOMEN: Soft, non-tender, non-distended. Bowel sounds present. No organomegaly or mass.  EXTREMITIES: No pedal edema, cyanosis, or clubbing.  PSYCHIATRIC: The patient is alert and oriented x name.  SKIN: No obvious rash, lesion, or ulcer.   DATA REVIEW:   CBC Recent Labs  Lab 04/20/17 0226  WBC 5.5  HGB 13.2  HCT 39.5*  PLT 153    Chemistries  Recent Labs  Lab 04/20/17 0226  NA 139  K 3.8  CL 106  CO2 26  GLUCOSE 127*  BUN 16  CREATININE 1.23  CALCIUM 8.6*  AST 19  ALT 10*  ALKPHOS 49  BILITOT 0.6    Cardiac Enzymes Recent Labs  Lab 04/20/17 0226  TROPONINI <0.03    Microbiology Results  @MICRORSLT48 @  RADIOLOGY:  Ct Head Wo Contrast  Result Date: 04/20/2017 CLINICAL DATA:  Stroke-like symptoms. Patient was flail E arms with slurred speech and left-sided weakness this morning. EXAM: CT HEAD WITHOUT CONTRAST TECHNIQUE: Contiguous axial images were obtained from the base of the skull through the vertex without intravenous contrast. COMPARISON:  None. FINDINGS: Brain: Atrophy with chronic small vessel ischemia. No large vascular territory infarct, hemorrhage or midline shift. Loss of gray-white matter distinction. No intra-axial mass nor extra-axial fluid collections. Midline fourth ventricle basal cisterns. Vascular: Moderate atherosclerosis of left vertebral artery and both cavernous sinus carotids. No hyperdense vessel sign. Skull: No acute skull fracture. Sinuses/Orbits: Bilateral cataract extractions. Clear paranasal sinuses and mastoid  air cells. Other: None IMPRESSION: Atrophy with chronic small vessel ischemia. No acute hemorrhage. No acute intracranial abnormality. Electronically Signed   By: Ashley Royalty M.D.   On: 04/20/2017 03:26   Mr Brain Wo Contrast  Result Date: 04/20/2017 CLINICAL DATA:  TIA.  Shaking garbled speech last night. EXAM: MRI HEAD WITHOUT CONTRAST MRA HEAD WITHOUT CONTRAST TECHNIQUE: Multiplanar, multiecho pulse sequences of the brain and surrounding structures were obtained without intravenous contrast. Angiographic images of the head were obtained using MRA technique without contrast. COMPARISON:  01/05/2016 FINDINGS: MRI HEAD FINDINGS Brain: No acute infarction, hemorrhage, hydrocephalus, extra-axial collection or mass lesion. Generalized atrophy and mild for age chronic small vessel ischemia in the cerebral white matter. Vascular: Arterial findings below. Preserved dural venous sinus flow voids. Skull and upper cervical spine: Normal marrow signal. Sinuses/Orbits: Negative. MRA HEAD FINDINGS Motion degraded, although better quality than prior. No visible right V4 segment, likely developmental given given T2 weighted imaging since 2010. There is no evidence of major vessel occlusion or flow limiting stenosis. No visualized aneurysm.  IMPRESSION: Brain MRI: Senescent changes without acute finding. Intracranial MRA: No acute finding. Motion degraded exam. Electronically Signed   By: Monte Fantasia M.D.   On: 04/20/2017 13:16   US Carotid Bilateral (at [facility] Only)  Result Date: 04/20/2017 CLINICAL DATA:  84 year old male with stroke-like symptoms EXAM: BILATERAL CAROTID DUPLEX ULTRASOUND TECHNIQUE: Pearline Cables scale imaging, color Doppler and duplex ultrasound were performed of bilateral carotid and vertebral arteries in the neck. COMPARISON:  Brain MRI 04/20/2017; prior carotid duplex ultrasound 01/05/2016 FINDINGS: Criteria: Quantification of carotid stenosis is based on velocity parameters that correlate the  residual internal carotid diameter with NASCET-based stenosis levels, using the diameter of the distal internal carotid lumen as the denominator for stenosis measurement. The following velocity measurements were obtained: RIGHT ICA:  113/13 cm/sec CCA:  250/5 cm/sec SYSTOLIC ICA/CCA RATIO:  1.0 DIASTOLIC ICA/CCA RATIO:  1.6 ECA:  192 cm/sec LEFT ICA:  80/12 cm/sec CCA:  397/6 cm/sec SYSTOLIC ICA/CCA RATIO:  0.6 DIASTOLIC ICA/CCA RATIO:  1.3 ECA:  111 cm/sec RIGHT CAROTID ARTERY: Trace heterogeneous atherosclerotic plaque in the proximal internal carotid artery. By peak systolic velocity criteria, the estimated stenosis remains less than 50%. RIGHT VERTEBRAL ARTERY:  Patent with normal antegrade flow. LEFT CAROTID ARTERY: No significant atherosclerotic plaque or evidence of stenosis. LEFT VERTEBRAL ARTERY:  Patent with normal antegrade flow. IMPRESSION: 1. Mild (1-49%) stenosis proximal right internal carotid artery secondary to heterogenous atherosclerotic plaque. No significant interval progression compared to 01/05/2016. 2. No significant atherosclerotic plaque or evidence of stenosis in the left internal carotid artery. 3. Vertebral arteries are patent with normal antegrade flow. Signed, Criselda Peaches, MD [facility] Electronically Signed   By: Jacqulynn Cadet M.D.   On: 04/20/2017 14:23   Mr Jodene Nam Head/brain BH Cm  Result Date: 04/20/2017 CLINICAL DATA:  TIA.  Shaking garbled speech last night. EXAM: MRI HEAD WITHOUT CONTRAST MRA HEAD WITHOUT CONTRAST TECHNIQUE: Multiplanar, multiecho pulse sequences of the brain and surrounding structures were obtained without intravenous contrast. Angiographic images of the head were obtained using MRA technique without contrast. COMPARISON:  01/05/2016 FINDINGS: MRI HEAD FINDINGS Brain: No acute infarction, hemorrhage, hydrocephalus, extra-axial collection or mass lesion. Generalized atrophy and mild for age  chronic small vessel ischemia in the cerebral white matter. Vascular: Arterial findings below. Preserved dural venous sinus flow voids. Skull and upper cervical spine: Normal marrow signal. Sinuses/Orbits: Negative. MRA HEAD FINDINGS Motion degraded, although better quality than prior. No visible right V4 segment, likely developmental given given T2 weighted imaging since 2010. There is no evidence of major vessel occlusion or flow limiting stenosis. No visualized aneurysm. IMPRESSION: Brain MRI: Senescent changes without acute finding. Intracranial MRA: No acute finding. Motion degraded exam. Electronically Signed   By: Monte Fantasia M.D.   On: 04/20/2017 13:16      Allergies as of 04/21/2017      Reactions   Penicillins Rash   Has patient had a PCN reaction causing immediate rash, facial/tongue/throat swelling, SOB or lightheadedness with hypotension: Yes Has patient had a PCN reaction causing severe rash involving mucus membranes or skin necrosis: No Has patient had a PCN reaction that required hospitalization No Has patient had a PCN reaction occurring within the last 10 years: No If all of the above answers are "NO", then may proceed with Cephalosporin use.      Medication List    TAKE these medications   aspirin EC 81 MG tablet Take 81 mg by mouth  daily.   atorvastatin 10 MG tablet Commonly known as:  LIPITOR Take 10 mg by mouth daily.   clopidogrel 75 MG tablet Commonly known as:  PLAVIX Take 75 mg by mouth daily.   levETIRAcetam 500 MG tablet Commonly known as:  KEPPRA Take 1 tablet (500 mg total) by mouth 2 (two) times daily. What changed:  when to take this   loperamide 2 MG capsule Commonly known as:  IMODIUM Take 2 mg by mouth 4 (four) times daily as needed for diarrhea or loose stools.   multivitamin with minerals tablet Take 1 tablet by mouth daily.   pantoprazole 40 MG tablet Commonly known as:  PROTONIX Take 40 mg by mouth daily.   terazosin 5 MG  capsule Commonly known as:  HYTRIN Take 1 capsule (5 mg total) by mouth 2 (two) times daily. What changed:  when to take this         Management plans discussed with the patient's family and they are in agreement. Stable for discharge   Patient should follow up with pcp  CODE STATUS:     Code Status Orders  (From admission, onward)        Start     Ordered   04/20/17 0924  Full code  Continuous     04/20/17 0923    Code Status History    Date Active Date Inactive Code Status Order ID Comments User Context   01/05/2016 0616 01/06/2016 1928 Full Code [id number]  Saundra Shelling, MD Inpatient   08/21/2015 0424 08/22/2015 1626 Full Code [id number]  Harrie Foreman, MD Inpatient    Advance Directive Documentation     Most Recent Value  Type of Advance Directive  Healthcare Power of Attorney  Pre-existing out of facility DNR order (yellow form or pink MOST form)  -  "MOST" Form in Place?  -      TOTAL TIME TAKING CARE OF THIS PATIENT: 38 minutes.    Note: This dictation was prepared with Dragon dictation along with smaller phrase technology. Any transcriptional errors that result from this process are unintentional.  Deshon Koslowski M.D on 04/21/2017 at 9:10 AM  Between 7am to 6pm - Pager - [phone] After 6pm go to [email or web address] - password [email or web address] [facility]  Office  [phone]  CC: Primary care physician; Rusty Aus, MD

## 2017-04-21 NOTE — Consult Note (Signed)
Referring Physician: Mody    Chief Complaint: Right sided weakness  HPI: Albert Boone is an 84 y.o. male with a history of seizures who awakened his wife yesterday evening and was shaking.  Afterward with right sided weakness, right sided facial droop and slurred speech that has resolved.  Wife reports that it did not look like his previous shaking spells but seemed as though he could not breathe.   Patient on Keppra as well as ASA and Plavix.   Per report of family patient has had memory problems for the past 84 yers or so that have been progressive.  Has had multiple events that were at times categorized as seizures and at other times categorized a mini-strokes but after each does not return to baseline.  Describes a step-wise progression of disability.  Currently ambulates with a cane.  Is incontinent of bowel at times.  Dresses himself but is quite slow.   Initial NIHSS of 4.    Date last known well: Date: 04/19/2017 Time last known well: Time: 20:00 tPA Given: No: Resolution of symptoms, outside time window  Past Medical History:  Diagnosis Date  . Dementia   . GERD (gastroesophageal reflux disease)   . Hyperlipemia   . Hypertension   . Prostatitis   . Stroke [facility])    mini strokes    Past Surgical History:  Procedure Laterality Date  . SKIN CANCER EXCISION     multiple times    Family History  Problem Relation Age of Onset  . Hypertension Father   . Prostate cancer Neg Hx   . Hematuria Neg Hx   . Kidney disease Neg Hx    Social History:  reports that he quit smoking about 50 years ago. His smoking use included cigarettes. He has never used smokeless tobacco. He reports that he drinks about 2.4 oz of alcohol per week. He reports that he does not use drugs.  Allergies:  Allergies  Allergen Reactions  . Penicillins Rash    Has patient had a PCN reaction causing immediate rash, facial/tongue/throat swelling, SOB or lightheadedness with hypotension: Yes Has  patient had a PCN reaction causing severe rash involving mucus membranes or skin necrosis: No Has patient had a PCN reaction that required hospitalization No Has patient had a PCN reaction occurring within the last 10 years: No If all of the above answers are "NO", then may proceed with Cephalosporin use.     Medications:  I have reviewed the patient's current medications. Prior to Admission:  Medications Prior to Admission  Medication Sig Dispense Refill Last Dose  . aspirin EC 81 MG tablet Take 81 mg by mouth daily.   Unkwn at [employer]  . atorvastatin (LIPITOR) 10 MG tablet Take 10 mg by mouth daily.   Unkwn at [employer] [employer]  . clopidogrel (PLAVIX) 75 MG tablet Take 75 mg by mouth daily.   Unkwn at [employer] [employer]  . levETIRAcetam (KEPPRA) 500 MG tablet Take 1 tablet (500 mg total) by mouth 2 (two) times daily. (Patient taking differently: Take 500 mg by mouth every evening. ) 60 tablet 0 Unkwn at [place]  . loperamide (IMODIUM) 2 MG capsule Take 2 mg by mouth 4 (four) times daily as needed for diarrhea or loose stools.   PRN at PRN  . Multiple Vitamins-Minerals (MULTIVITAMIN WITH MINERALS) tablet Take 1 tablet by mouth daily.   Unkwn at [facility]  . pantoprazole (PROTONIX) 40 MG tablet Take 40 mg by mouth daily.   Unkwn at [employer]  .  terazosin (HYTRIN) 5 MG capsule Take 1 capsule (5 mg total) by mouth 2 (two) times daily. (Patient taking differently: Take 5 mg by mouth daily. ) 30 capsule 11 Unkwn at [facility]   Scheduled: . aspirin EC  81 mg Oral Daily  . atorvastatin  10 mg Oral Daily  . clopidogrel  75 mg Oral Daily  . enoxaparin (LOVENOX) injection  40 mg Subcutaneous Q24H  . levETIRAcetam  500 mg Oral QPM  . LORazepam  1 mg Intravenous Once  . multivitamin with minerals  1 tablet Oral Daily  . pantoprazole  40 mg Oral Daily  . terazosin  5 mg Oral Daily    ROS: History obtained from the patient and family  General ROS: negative for - chills, fatigue, fever, night sweats, weight gain or weight  loss Psychological ROS: as noted in HPI Ophthalmic ROS: negative for - blurry vision, double vision, eye pain or loss of vision ENT ROS: negative for - epistaxis, nasal discharge, oral lesions, sore throat, tinnitus or vertigo Allergy and Immunology ROS: negative for - hives or itchy/watery eyes Hematological and Lymphatic ROS: negative for - bleeding problems, bruising or swollen lymph nodes Endocrine ROS: negative for - galactorrhea, hair pattern changes, polydipsia/polyuria or temperature intolerance Respiratory ROS: negative for - cough, hemoptysis, shortness of breath or wheezing Cardiovascular ROS: negative for - chest pain, dyspnea on exertion, edema or irregular heartbeat Gastrointestinal ROS: as noted in HPI Genito-Urinary ROS: negative for - dysuria, hematuria, incontinence or urinary frequency/urgency Musculoskeletal ROS: negative for - joint swelling or muscular weakness Neurological ROS: as noted in HPI Dermatological ROS: negative for rash and skin lesion changes  Physical Examination: Blood pressure (!) 157/91, pulse 80, temperature (!) 97.5 F (36.4 C), temperature source Oral, resp. rate 18, height 5\' 8"  (1.727 m), weight 66.3 kg (146 lb 3.2 oz), SpO2 95 %.  HEENT-  Normocephalic, no lesions, without obvious abnormality.  Normal external eye and conjunctiva.  Normal TM's bilaterally.  Normal auditory canals and external ears. Normal external nose, mucus membranes and septum.  Normal pharynx. Cardiovascular- S1, S2 normal, pulses palpable throughout   Lungs- chest clear, no wheezing, rales, normal symmetric air entry Abdomen- soft, non-tender; bowel sounds normal; no masses,  no organomegaly Extremities- no edema Lymph-no adenopathy palpable Musculoskeletal-no joint tenderness, deformity or swelling Skin-warm and dry, no hyperpigmentation, vitiligo, or suspicious lesions  Neurological Examination   Mental Status: Alert, memory poor.  Easily distracted.  Speech fluent but  dysarthric.  Able to follow 3 step commands but requires some reinforcement. Cranial Nerves: II: Discs flat bilaterally; Visual fields grossly normal Boone,IV, VI: ptosis not present, extra-ocular motions intact bilaterally with a mild left esotropia V,VII: smile symmetric, facial light touch sensation normal bilaterally VIII: hearing normal bilaterally IX,X: gag reflex present XI: bilateral shoulder shrug XII: midline tongue extension Motor: Right : Upper extremity   5/5    Left:     Upper extremity   5/5  Lower extremity   5/5     Lower extremity   4-/5 Tone and bulk:normal tone throughout; no atrophy noted Sensory: Pinprick and light touch intact throughout, bilaterally Deep Tendon Reflexes: 2+ in the upper extremities and absent in the lower extremities Plantars: Right: downgoing   Left: upgoing Cerebellar: Normal finger-to-nose and normal heel-to-shin testing bilaterally Gait: not tested due to safety concerns    Laboratory Studies:  Basic Metabolic Panel: Recent Labs  Lab 04/20/17 0226  NA 139  K 3.8  CL 106  CO2 26  GLUCOSE 127*  BUN 16  CREATININE 1.23  CALCIUM 8.6*    Liver Function Tests: Recent Labs  Lab 04/20/17 0226  AST 19  ALT 10*  ALKPHOS 49  BILITOT 0.6  PROT 7.4  ALBUMIN 3.7   No results for input(s): LIPASE, AMYLASE in the last 168 hours. No results for input(s): AMMONIA in the last 168 hours.  CBC: Recent Labs  Lab 04/20/17 0226  WBC 5.5  HGB 13.2  HCT 39.5*  MCV 91.0  PLT 153    Cardiac Enzymes: Recent Labs  Lab 04/20/17 0226  TROPONINI <0.03    BNP: Invalid input(s): POCBNP  CBG: No results for input(s): GLUCAP in the last 168 hours.  Microbiology: Results for orders placed or performed during the hospital encounter of 08/20/15  Urine culture     Status: None   Collection Time: 08/21/15 12:05 AM  Result Value Ref Range Status   Specimen Description URINE, CLEAN CATCH  Final   Special Requests NONE  Final   Culture  NO GROWTH Performed at [facility]   Final   Report Status 08/22/2015 FINAL  Final  Blood Culture (routine x 2)     Status: None   Collection Time: 08/21/15 12:07 AM  Result Value Ref Range Status   Specimen Description BLOOD RIGHT ASSIST CONTROL  Final   Special Requests BOTTLES DRAWN AEROBIC AND ANAEROBIC 7ML  Final   Culture NO GROWTH 5 DAYS  Final   Report Status 08/26/2015 FINAL  Final  Blood Culture (routine x 2)     Status: None   Collection Time: 08/21/15 12:07 AM  Result Value Ref Range Status   Specimen Description BLOOD LEFT WRIST  Final   Special Requests BOTTLES DRAWN AEROBIC AND ANAEROBIC 5ML  Final   Culture NO GROWTH 5 DAYS  Final   Report Status 08/26/2015 FINAL  Final    Coagulation Studies: No results for input(s): LABPROT, INR in the last 72 hours.  Urinalysis:  Recent Labs  Lab 04/20/17 0423  COLORURINE YELLOW*  LABSPEC 1.017  PHURINE 5.0  GLUCOSEU NEGATIVE  HGBUR NEGATIVE  BILIRUBINUR NEGATIVE  KETONESUR NEGATIVE  PROTEINUR NEGATIVE  NITRITE NEGATIVE  LEUKOCYTESUR NEGATIVE    Lipid Panel:    Component Value Date/Time   CHOL 129 04/21/2017 0704   CHOL 128 05/30/2013 0326   TRIG 84 04/21/2017 0704   TRIG 137 05/30/2013 0326   HDL 56 04/21/2017 0704   HDL 43 05/30/2013 0326   CHOLHDL 2.3 04/21/2017 0704   VLDL 17 04/21/2017 0704   VLDL 27 05/30/2013 0326   LDLCALC 56 04/21/2017 0704   LDLCALC 58 05/30/2013 0326    HgbA1C:  Lab Results  Component Value Date   HGBA1C 5.6 04/21/2017    Urine Drug Screen:      Component Value Date/Time   LABOPIA NONE DETECTED 01/05/2016 0349   COCAINSCRNUR NONE DETECTED 01/05/2016 0349   LABBENZ NONE DETECTED 01/05/2016 0349   AMPHETMU NONE DETECTED 01/05/2016 0349   THCU NONE DETECTED 01/05/2016 0349   LABBARB NONE DETECTED 01/05/2016 0349    Alcohol Level: No results for input(s): ETH in the last 168 hours.  Other results: EKG: sinus tachycardia at 107 bpm.  Imaging: Ct Head Wo  Contrast  Result Date: 04/20/2017 CLINICAL DATA:  Stroke-like symptoms. Patient was flail E arms with slurred speech and left-sided weakness this morning. EXAM: CT HEAD WITHOUT CONTRAST TECHNIQUE: Contiguous axial images were obtained from the base of the skull through the vertex without intravenous  contrast. COMPARISON:  None. FINDINGS: Brain: Atrophy with chronic small vessel ischemia. No large vascular territory infarct, hemorrhage or midline shift. Loss of gray-white matter distinction. No intra-axial mass nor extra-axial fluid collections. Midline fourth ventricle basal cisterns. Vascular: Moderate atherosclerosis of left vertebral artery and both cavernous sinus carotids. No hyperdense vessel sign. Skull: No acute skull fracture. Sinuses/Orbits: Bilateral cataract extractions. Clear paranasal sinuses and mastoid air cells. Other: None IMPRESSION: Atrophy with chronic small vessel ischemia. No acute hemorrhage. No acute intracranial abnormality. Electronically Signed   By: Ashley Royalty M.D.   On: 04/20/2017 03:26   Mr Brain Wo Contrast  Result Date: 04/20/2017 CLINICAL DATA:  TIA.  Shaking garbled speech last night. EXAM: MRI HEAD WITHOUT CONTRAST MRA HEAD WITHOUT CONTRAST TECHNIQUE: Multiplanar, multiecho pulse sequences of the brain and surrounding structures were obtained without intravenous contrast. Angiographic images of the head were obtained using MRA technique without contrast. COMPARISON:  01/05/2016 FINDINGS: MRI HEAD FINDINGS Brain: No acute infarction, hemorrhage, hydrocephalus, extra-axial collection or mass lesion. Generalized atrophy and mild for age chronic small vessel ischemia in the cerebral white matter. Vascular: Arterial findings below. Preserved dural venous sinus flow voids. Skull and upper cervical spine: Normal marrow signal. Sinuses/Orbits: Negative. MRA HEAD FINDINGS Motion degraded, although better quality than prior. No visible right V4 segment, likely developmental given  given T2 weighted imaging since 2010. There is no evidence of major vessel occlusion or flow limiting stenosis. No visualized aneurysm. IMPRESSION: Brain MRI: Senescent changes without acute finding. Intracranial MRA: No acute finding. Motion degraded exam. Electronically Signed   By: Monte Fantasia M.D.   On: 04/20/2017 13:16   US Carotid Bilateral (at [facility] Only)  Result Date: 04/20/2017 CLINICAL DATA:  84 year old male with stroke-like symptoms EXAM: BILATERAL CAROTID DUPLEX ULTRASOUND TECHNIQUE: Pearline Cables scale imaging, color Doppler and duplex ultrasound were performed of bilateral carotid and vertebral arteries in the neck. COMPARISON:  Brain MRI 04/20/2017; prior carotid duplex ultrasound 01/05/2016 FINDINGS: Criteria: Quantification of carotid stenosis is based on velocity parameters that correlate the residual internal carotid diameter with NASCET-based stenosis levels, using the diameter of the distal internal carotid lumen as the denominator for stenosis measurement. The following velocity measurements were obtained: RIGHT ICA:  113/13 cm/sec CCA:  726/2 cm/sec SYSTOLIC ICA/CCA RATIO:  1.0 DIASTOLIC ICA/CCA RATIO:  1.6 ECA:  192 cm/sec LEFT ICA:  80/12 cm/sec CCA:  035/5 cm/sec SYSTOLIC ICA/CCA RATIO:  0.6 DIASTOLIC ICA/CCA RATIO:  1.3 ECA:  111 cm/sec RIGHT CAROTID ARTERY: Trace heterogeneous atherosclerotic plaque in the proximal internal carotid artery. By peak systolic velocity criteria, the estimated stenosis remains less than 50%. RIGHT VERTEBRAL ARTERY:  Patent with normal antegrade flow. LEFT CAROTID ARTERY: No significant atherosclerotic plaque or evidence of stenosis. LEFT VERTEBRAL ARTERY:  Patent with normal antegrade flow. IMPRESSION: 1. Mild (1-49%) stenosis proximal right internal carotid artery secondary to heterogenous atherosclerotic plaque. No significant interval progression compared to 01/05/2016. 2. No significant atherosclerotic plaque or evidence of stenosis in the left  internal carotid artery. 3. Vertebral arteries are patent with normal antegrade flow. Signed, Criselda Peaches, MD [facility] Electronically Signed   By: Jacqulynn Cadet M.D.   On: 04/20/2017 14:23   Mr Jodene Nam Head/brain HR Cm  Result Date: 04/20/2017 CLINICAL DATA:  TIA.  Shaking garbled speech last night. EXAM: MRI HEAD WITHOUT CONTRAST MRA HEAD WITHOUT CONTRAST TECHNIQUE: Multiplanar, multiecho pulse sequences of the brain and surrounding structures were obtained without intravenous contrast. Angiographic  images of the head were obtained using MRA technique without contrast. COMPARISON:  01/05/2016 FINDINGS: MRI HEAD FINDINGS Brain: No acute infarction, hemorrhage, hydrocephalus, extra-axial collection or mass lesion. Generalized atrophy and mild for age chronic small vessel ischemia in the cerebral white matter. Vascular: Arterial findings below. Preserved dural venous sinus flow voids. Skull and upper cervical spine: Normal marrow signal. Sinuses/Orbits: Negative. MRA HEAD FINDINGS Motion degraded, although better quality than prior. No visible right V4 segment, likely developmental given given T2 weighted imaging since 2010. There is no evidence of major vessel occlusion or flow limiting stenosis. No visualized aneurysm. IMPRESSION: Brain MRI: Senescent changes without acute finding. Intracranial MRA: No acute finding. Motion degraded exam. Electronically Signed   By: Monte Fantasia M.D.   On: 04/20/2017 13:16    Assessment: 84 y.o. male presenting after an episode of right sided weakness.  Patient's symptoms currently resolved.  Patient on ASA and Plavix at home.  MRI of the brain reviewed and shows no acute changes.  Reports has been evaluated in the past with a loop monitor that by family report was unremarkable.  Carotid dopplers show no evidence of hemodynamically significant stenosis.  Echocardiogram was performed on 03/11/17 and shows  no cardiac source of emboli with an EF of 55%.  A1c 5.6, LDL 56.  Stroke Risk Factors - hyperlipidemia and hypertension  Plan: 1. Continue ASA and Plavix 2. Continue Keppra at current dose 3. Telemetry monitoring 4. Frequent neuro checks 5. Patient to keep scheduled appointment with Dr. Manuella Ghazi that is scheduled within the next week per family.     Alexis Goodell, MD Neurology [phone] 04/21/2017, 1:23 PM

## 2017-04-21 NOTE — Clinical Social Work Note (Signed)
CSW spoke to patient and his family to discuss SNF placement options and give bed offers.  Patient's family have agreed to go to [facility], pending insurance authorization.  CSW spoke to [facility] and they said they are working on authorization.  CSW spoke to [facility] and they can accept patient on Friday once insurance has been approved.  CSW updated patient's family and informed them that if insurance does not approve patient they will have to consider paying privately or going home with home health.  Patient's family expressed understanding, patient's wife asked about in home care givers, CSW informed her that if patient decided to have in home care givers, she will have to pay out of pocket for them as well.  Jones Broom. [place], MSW, [place]  04/21/2017 3:58 PM

## 2017-04-21 NOTE — Evaluation (Signed)
Occupational Therapy Evaluation Patient Details Name: Albert Boone MRN: [id number] DOB: 04-09-1922 Today's Date: 04/21/2017    History of Present Illness 84 year old male with history of BPH TIAs and seizure disorder with underlying dementia (undiagnosed) who presented with garbled speech, R facial droop, and RUE weakness. Neuro work up negative for acute stroke.   Clinical Impression   Pt seen for OT evaluation this date. Prior to hospital admission, pt was ambulating with a SPC, at least 2 falls in past 12 months per family report, and generally able to perform basic ADL tasks by himself, per family report (pt unable to verbalize). Family checks on pt/spouse daily (spouse has early stage dementia) and provide transportation and assist with groceries. Pt's family note that they used to have a housekeeper but may not have been using lately based on the family's observation of their home. Daughter notes that pt likely is in charge of his own medication still, however notes that pt's son thought that the pt may have been unintentionally skipping doses. Currently pt is functionally limited by fatigue and lethargy. Able to resist OT's attempts to move his arms bilaterally with good grip and no one sided weakness appreciated. Caregiver/family education/training provided in basic dementia care strategies to minimize pt's falls risk, maximize safety, and maximize independence and participation in ADL and social situations. Educated in meaningful activities to support quality of life. Pt/caregivers would benefit from skilled OT to address noted impairments and functional limitations (see below for any additional details).  Upon hospital discharge, recommend pt discharge to [facility].    Follow Up Recommendations  SNF    Equipment Recommendations  Other (comment)(TBD)    Recommendations for Other Services       Precautions / Restrictions Precautions Precautions: Fall Restrictions Weight Bearing  Restrictions: No      Mobility Bed Mobility Overal bed mobility: Needs Assistance             General bed mobility comments: unable to assess due to pt's fatigue/lethargy, pt up in recliner after ambulating with PT, per PT and family's report  Transfers Overall transfer level: Needs assistance               General transfer comment: unable to assess due to pt's fatigue/lethargy    Balance                                           ADL either performed or assessed with clinical judgement   ADL Overall ADL's : Needs assistance/impaired                                       General ADL Comments: Pt too lethargic/fatigued to formally assess functional ADL skills, will continue to assess.      Vision Baseline Vision/History: Wears glasses Wears Glasses: At all times Patient Visual Report: (unable to assess due to pt's fatigue/lethargy)       Perception     Praxis      Pertinent Vitals/Pain Pain Assessment: Faces Faces Pain Scale: No hurt     Hand Dominance Right   Extremity/Trunk Assessment Upper Extremity Assessment Upper Extremity Assessment: Difficult to assess due to impaired cognition(pt able to resist movement bilaterally when OT attempted to move arms, strength appeared even bilaterally, good grip strength, unable to  assess sensation and coordination)   Lower Extremity Assessment Lower Extremity Assessment: Difficult to assess due to impaired cognition       Communication Communication Communication: HOH(R worse than L)   Cognition Arousal/Alertness: Lethargic Behavior During Therapy: Flat affect Overall Cognitive Status: History of cognitive impairments - at baseline                                 General Comments: Pt very lethargic/fatigued, difficulty keeping eyes open with max verbal and tactile cues. Oriented to self but unable to determine additional orientation due to pt falling back  asleep   General Comments       Exercises Other Exercises Other Exercises: Caregiver/family education/training provided in basic dementia care strategies to minimize pt's falls risk, maximize safety, and maximize independence and participation in ADL and social situations. Educated in meaningful activities to support quality of life.    Shoulder Instructions      Home Living Family/patient expects to be discharged to:: SNF Living Arrangements: Spouse/significant other Available Help at Discharge: Family;Available PRN/intermittently(spouse with mild dementia, family checks in on pt/spouse daily) Type of Home: House Home Access: Level entry     Home Layout: One level     Bathroom Shower/Tub: Tub/shower unit;Walk-in shower   Bathroom Toilet: Handicapped height     Home Equipment: Shower seat - built in;Grab bars - tub/shower;Grab bars - toilet;Walker - 2 wheels;Cane - single point          Prior Functioning/Environment Level of Independence: Needs assistance  Gait / Transfers Assistance Needed: ambulating with SPC, at least 2 falls in past 12 months (LOB, decreased safety awareness) ADL's / Homemaking Assistance Needed: Generally independent with basic ADL per family report (not entirely sure, spouse may assist, very "doting"), eats most meals out with spouse, family assists with driving and groceries; had housekeeper but family doesn't think they have been using the housekeeper recently            OT Problem List: Decreased activity tolerance;Decreased cognition;Decreased safety awareness;Impaired balance (sitting and/or standing)      OT Treatment/Interventions: Self-care/ADL training;Balance training;Therapeutic activities;Cognitive remediation/compensation;Patient/family education    OT Goals(Current goals can be found in the care plan section) Acute Rehab OT Goals Patient Stated Goal: family wants pt to be as safe as possible and return to PLOF OT Goal Formulation:  With family Time For Goal Achievement: 05/05/17 Potential to Achieve Goals: Good ADL Goals Pt Will Transfer to Toilet: with min guard assist;ambulating(LRAD for amb, comfort height toilet) Additional ADL Goal #1: Family/caregivers will verbalize understanding of learned techniques to support pt's participation in self care tasks and dementia care techniques to maximize pt's safety, independence, and meaningful engagement in daily activities.  OT Frequency: Min 1X/week   Barriers to D/C: Decreased caregiver support  spouse with mild/early stage dementia       Co-evaluation              AM-PAC PT "6 Clicks" Daily Activity     Outcome Measure Help from another person eating meals?: A Little Help from another person taking care of personal grooming?: A Little Help from another person toileting, which includes using toliet, bedpan, or urinal?: A Little Help from another person bathing (including washing, rinsing, drying)?: A Lot Help from another person to put on and taking off regular upper body clothing?: A Little Help from another person to put on and taking off regular lower body  clothing?: A Lot 6 Click Score: 16   End of Session    Activity Tolerance: Patient limited by fatigue;Patient limited by lethargy Patient left: in chair;with call bell/phone within reach;with chair alarm set;with family/visitor present  OT Visit Diagnosis: Other abnormalities of gait and mobility (R26.89);History of falling (Z91.81);Other symptoms and signs involving cognitive function                Time: 1115-1135 OT Time Calculation (min): 20 min Charges:  OT General Charges $OT Visit: 1 Visit OT Evaluation $OT Eval Moderate Complexity: 1 Mod OT Treatments $Therapeutic Activity: 8-22 mins  Jeni Salles, MPH, MS, OTR/L ascom [phone] 04/21/17, 12:13 PM

## 2017-04-21 NOTE — Clinical Social Work Note (Signed)
Clinical Social Work Assessment  Patient Details  Name: PAXTYN BOYAR MRN: [id number] Date of Birth: 12-Mar-1922  Date of referral:  04/21/17               Reason for consult:  Facility Placement                Permission sought to share information with:  Facility Sport and exercise psychologist, Family Supports Permission granted to share information::  Yes, Verbal Permission Granted  Name::     [place], [place] [phone]  [phone] or Gus Puma Daughter   [phone] or Dec IV,Fred Pandora Leiter   [phone]   Agency::  SNF admissions  Relationship::     Contact Information:     Housing/Transportation Living arrangements for the past 2 months:  Single Family Home Source of Information:  Spouse, Adult Children Patient Interpreter Needed:  None Criminal Activity/Legal Involvement Pertinent to Current Situation/Hospitalization:  No - Comment as needed Significant Relationships:  Adult Children, Spouse Lives with:  Spouse Do you feel safe going back to the place where you live?  No Need for family participation in patient care:  Yes (Comment)  Care giving concerns:  Patient's family feel he needs some short term rehab before he is able to return back home.   Social Worker assessment / plan:  Patient is a 84 year old male who is alert and oriented x1, patient has not been to rehab before.  CSW spoke to patient and his family who were at bedside in regards to going to SNF and what the process is for placement.  CSW discussed with family what to expect at SNF and how insurance will pay for stay.  CSW informed them that if patient is not approved they will have to either pay privately or go home with home health.  Patient's family expressed understanding.  Patient's family stated they are planning to try to find either an ALF or a SNF as a long term care resident after rehab.  Patient's family stated that they have a meeting with [facility] in the morning to discuss placing him  in ALF.  Patient and family did not have any other questions or concerns.  CSW was given permission to begin bed search in [place].  Employment status:  Retired Nurse, adult PT Recommendations:  [place] / Referral to community resources:  [place]  Patient/Family's Response to care:  Patient's family is in agreement to going to SNF for short term rehab and then plan to transition to either ALF or long term SNF placement.  Patient/Family's Understanding of and Emotional Response to Diagnosis, Current Treatment, and Prognosis:  Patient and family are hopeful that patient will be able to get some rehab, and then transition to either long term care or an ALF.  Emotional Assessment Appearance:  Appears stated age Attitude/Demeanor/Rapport:    Affect (typically observed):  Calm, Appropriate Orientation:  Oriented to Self Alcohol / Substance use:  Not Applicable Psych involvement (Current and /or in the community):  No (Comment)  Discharge Needs  Concerns to be addressed:  Cognitive Concerns, Lack of Support Readmission within the last 30 days:  No Current discharge risk:  Cognitively Impaired, Lack of support system Barriers to Discharge:  Insurance Authorization   Anell Barr 04/21/2017, 4:04 PM

## 2017-04-22 DIAGNOSIS — K219 Gastro-esophageal reflux disease without esophagitis: Secondary | ICD-10-CM | POA: Diagnosis not present

## 2017-04-22 DIAGNOSIS — R634 Abnormal weight loss: Secondary | ICD-10-CM | POA: Diagnosis not present

## 2017-04-22 DIAGNOSIS — Z7401 Bed confinement status: Secondary | ICD-10-CM | POA: Diagnosis not present

## 2017-04-22 DIAGNOSIS — N400 Enlarged prostate without lower urinary tract symptoms: Secondary | ICD-10-CM | POA: Diagnosis not present

## 2017-04-22 DIAGNOSIS — G40219 Localization-related (focal) (partial) symptomatic epilepsy and epileptic syndromes with complex partial seizures, intractable, without status epilepticus: Secondary | ICD-10-CM | POA: Diagnosis not present

## 2017-04-22 DIAGNOSIS — I69398 Other sequelae of cerebral infarction: Secondary | ICD-10-CM | POA: Diagnosis not present

## 2017-04-22 DIAGNOSIS — M6281 Muscle weakness (generalized): Secondary | ICD-10-CM | POA: Diagnosis not present

## 2017-04-22 DIAGNOSIS — G459 Transient cerebral ischemic attack, unspecified: Secondary | ICD-10-CM | POA: Diagnosis not present

## 2017-04-22 DIAGNOSIS — Z85828 Personal history of other malignant neoplasm of skin: Secondary | ICD-10-CM | POA: Diagnosis not present

## 2017-04-22 DIAGNOSIS — N419 Inflammatory disease of prostate, unspecified: Secondary | ICD-10-CM | POA: Diagnosis not present

## 2017-04-22 DIAGNOSIS — I69322 Dysarthria following cerebral infarction: Secondary | ICD-10-CM | POA: Diagnosis not present

## 2017-04-22 DIAGNOSIS — F0390 Unspecified dementia without behavioral disturbance: Secondary | ICD-10-CM | POA: Diagnosis not present

## 2017-04-22 DIAGNOSIS — G40909 Epilepsy, unspecified, not intractable, without status epilepticus: Secondary | ICD-10-CM | POA: Diagnosis not present

## 2017-04-22 DIAGNOSIS — I69391 Dysphagia following cerebral infarction: Secondary | ICD-10-CM | POA: Diagnosis not present

## 2017-04-22 DIAGNOSIS — R4189 Other symptoms and signs involving cognitive functions and awareness: Secondary | ICD-10-CM | POA: Diagnosis not present

## 2017-04-22 DIAGNOSIS — R4182 Altered mental status, unspecified: Secondary | ICD-10-CM | POA: Diagnosis not present

## 2017-04-22 DIAGNOSIS — E785 Hyperlipidemia, unspecified: Secondary | ICD-10-CM | POA: Diagnosis not present

## 2017-04-22 DIAGNOSIS — R569 Unspecified convulsions: Secondary | ICD-10-CM | POA: Diagnosis not present

## 2017-04-22 DIAGNOSIS — I10 Essential (primary) hypertension: Secondary | ICD-10-CM | POA: Diagnosis not present

## 2017-04-22 DIAGNOSIS — I779 Disorder of arteries and arterioles, unspecified: Secondary | ICD-10-CM | POA: Diagnosis not present

## 2017-04-22 DIAGNOSIS — Z8673 Personal history of transient ischemic attack (TIA), and cerebral infarction without residual deficits: Secondary | ICD-10-CM | POA: Diagnosis not present

## 2017-04-22 DIAGNOSIS — Z7982 Long term (current) use of aspirin: Secondary | ICD-10-CM | POA: Diagnosis not present

## 2017-04-22 DIAGNOSIS — R1312 Dysphagia, oropharyngeal phase: Secondary | ICD-10-CM | POA: Diagnosis not present

## 2017-04-22 DIAGNOSIS — G3184 Mild cognitive impairment, so stated: Secondary | ICD-10-CM | POA: Diagnosis not present

## 2017-04-22 NOTE — Progress Notes (Signed)
Occupational Therapy Treatment Patient Details Name: Albert Boone MRN: [id number] DOB: February 16, 1922 Today's Date: 04/22/2017    History of present illness 84 year old male with history of BPH TIAs and seizure disorder with underlying dementia (undiagnosed) who presented with garbled speech, R facial droop, and RUE weakness. Neuro work up negative for acute stroke.   OT comments  Pt.  continues to present with weakness, limited activity tolerance, limited cognition, impaired standing balance with retropulsion initially upon standing, and limited functional mobility which limits the ability to complete basic ADL and IADL functioning safely. Pt. requires increased assist during sit to stand, and initially for balance in standing. Pt requires increased verbal cues for hand placement on the walker. Pt. Continues to benefit from OT services for ADL training, neuromuscular re-ed, and pt./familiy education about cognitive compensatory strategies, home modification, and DME in order to maximize independence with ADLs, and IADLs. Pt. Would benefit from SNF level of care upon discharge with follow-up OT services.   Follow Up Recommendations  SNF    Equipment Recommendations  Other (comment)    Recommendations for Other Services      Precautions / Restrictions         Mobility Bed Mobility Overal bed mobility: Needs Assistance Bed Mobility: Supine to Sit     Supine to sit: Min guard        Transfers Overall transfer level: Needs assistance Equipment used: Rolling walker (2 wheeled) Transfers: Sit to/from Stand Sit to Stand: Mod assist(assist needed initially in standing secondary to retropulsion.  Cues for hand placement.)         General transfer comment: Pt. was able to transfer to the chair with minA.    Balance Overall balance assessment: Needs assistance Sitting-balance support: Single extremity supported         Standing balance-Leahy Scale: Poor                             ADL either performed or assessed with clinical judgement   ADL Overall ADL's : Needs assistance/impaired Eating/Feeding: Independent;Set up   Grooming: Set up;Minimal assistance   Upper Body Bathing: Set up   Lower Body Bathing: Set up;Moderate assistance   Upper Body Dressing : Set up;Independent   Lower Body Dressing: Set up;Minimal assistance;Moderate assistance(with increased time.)   Toilet Transfer: Minimal assistance;Moderate assistance           Functional mobility during ADLs: Minimal assistance;Moderate assistance       Vision Baseline Vision/History: Wears glasses Wears Glasses: At all times Patient Visual Report: No change from baseline     Perception     Praxis      Cognition Arousal/Alertness: Awake/alert   Overall Cognitive Status: Within Functional Limits for tasks assessed                                          Exercises     Shoulder Instructions       General Comments      Pertinent Vitals/ Pain       Pain Assessment: No/denies pain  Home Living                                          Prior Functioning/Environment  Frequency  Min 1X/week        Progress Toward Goals  OT Goals(current goals can now be found in the care plan section)     Acute Rehab OT Goals OT Goal Formulation: With family Potential to Achieve Goals: Good  Plan Discharge plan remains appropriate    Co-evaluation                 AM-PAC PT "6 Clicks" Daily Activity     Outcome Measure   Help from another person eating meals?: None Help from another person taking care of personal grooming?: A Little Help from another person toileting, which includes using toliet, bedpan, or urinal?: A Lot Help from another person bathing (including washing, rinsing, drying)?: A Lot Help from another person to put on and taking off regular upper body clothing?: A Little Help from  another person to put on and taking off regular lower body clothing?: A Lot 6 Click Score: 16    End of Session Equipment Utilized During Treatment: Gait belt  OT Visit Diagnosis: Other abnormalities of gait and mobility (R26.89);History of falling (Z91.81);Other symptoms and signs involving cognitive function   Activity Tolerance Patient limited by fatigue;Patient limited by lethargy   Patient Left in chair;with call bell/phone within reach;with chair alarm set;with family/visitor present   Nurse Communication          Time: 9470-9628 OT Time Calculation (min): 24 min  Charges: OT General Charges $OT Visit: 1 Visit OT Treatments $Self Care/Home Management : 23-37 mins  Harrel Carina, MS, OTR/L    Harrel Carina, MS, OTR/L 04/22/2017, 11:56 AM

## 2017-04-22 NOTE — Plan of Care (Signed)
Pt d/ced to [facility] - going to  Rm 502.  Called report to [facility].  Pt admitted for possible stroke and everything was negative.  Removed IV and called EMS for transport.

## 2017-04-22 NOTE — Progress Notes (Signed)
Health Team case manager requested OT to see patient again this morning. CSW contacted OT who has agreed to see patient. Per nurse patient is more awake and alert today. Clinical Social Worker (CSW) contacted patient's wife Romie Minus and made her aware of above and explained that if [facility] denies SNF then patient will have to discharge home.   [employer], LCSW [phone]

## 2017-04-22 NOTE — Clinical Social Work Placement (Signed)
   CLINICAL SOCIAL WORK PLACEMENT  NOTE  Date:  04/22/2017  Patient Details  Name: Albert Boone MRN: [id number] Date of Birth: 28-Jan-1922  Clinical Social Work is seeking post-discharge placement for this patient at the [place] level of care (*CSW will initial, date and re-position this form in  chart as items are completed):  Yes   Patient/family provided with [facility] Work Department's list of facilities offering this level of care within the geographic area requested by the patient (or if unable, by the patient's family).  Yes   Patient/family informed of their freedom to choose among providers that offer the needed level of care, that participate in Medicare, Medicaid or managed care program needed by the patient, have an available bed and are willing to accept the patient.  Yes   Patient/family informed of [facility]'s ownership interest in [facility] and [facility], as well as of the fact that they are under no obligation to receive care at these facilities.  PASRR submitted to EDS on 04/21/17     PASRR number received on 04/21/17     Existing PASRR number confirmed on       FL2 transmitted to all facilities in geographic area requested by pt/family on 04/21/17     FL2 transmitted to all facilities within larger geographic area on       Patient informed that his/her managed care company has contracts with or will negotiate with certain facilities, including the following:        Yes   Patient/family informed of bed offers received.  Patient chooses bed at [facility]     Physician recommends and patient chooses bed at      Patient to be transferred to [facility] on 04/22/17.  Patient to be transferred to facility by [facility] )     Patient family notified on 04/22/17 of transfer.  Name of family member notified:  (Patient's daughter Lelon Frohlich and wife Romie Minus are at bedside and  aware of D/C today. )     PHYSICIAN       Additional Comment:    _______________________________________________ Aison Malveaux, Veronia Beets, LCSW 04/22/2017, 5:04 PM

## 2017-04-22 NOTE — Discharge Summary (Addendum)
[place] [facility] [place] NAME: Albert Boone    MR#:  [id number]  [place]:  1922-11-14  DATE OF ADMISSION:  04/20/2017 ADMITTING PHYSICIAN: Bettey Costa, MD  DATE OF DISCHARGE: 04/22/2017  PRIMARY CARE PHYSICIAN: Rusty Aus, MD    ADMISSION DIAGNOSIS:  TIA (transient ischemic attack) [G45.9] Stroke (cerebrum) ([place]) [I63.9]  DISCHARGE DIAGNOSIS:  Active Problems:   TIA (transient ischemic attack)  SECONDARY DIAGNOSIS:   Past Medical History:  Diagnosis Date  . Dementia   . GERD (gastroesophageal reflux disease)   . Hyperlipemia   . Hypertension   . Prostatitis   . Stroke [facility])    mini strokes    HOSPITAL COURSE:   84 year old male with history of BPH TIAs and seizure disorder with underlying dementia (undiagnosed) who presented with garbled speech.  1.  TIA: Patient has had several TIAs in the past.  He underwent CVA workup which essentially was negative for acute stroke. He will continue on aspirin, Plavix and statin  2.  History of seizure disorder: Continue Keppra He will follow-up with Dr. Manuella Ghazi his neurologist in 1-2 weeks.  3.  BPH: Continue Hytrin   DISCHARGE CONDITIONS AND DIET:   Stable condition  Dysphagia level 3 (mech soft) w/ Thin liquids via CUP - NO STRAWS. General aspiration precautions; reduce distractions during meals.   Medication Administration: Whole meds with puree(for easier, safer swallowing or Crushed if needed/able)  Alternative forms such as chewables can be considered  CONSULTS OBTAINED:  Treatment Team:  Catarina Hartshorn, MD Alexis Goodell, MD  DRUG ALLERGIES:   Allergies  Allergen Reactions  . Penicillins Rash    Has patient had a PCN reaction causing immediate rash, facial/tongue/throat swelling, SOB or lightheadedness with hypotension: Yes Has patient had a PCN reaction causing severe rash involving mucus membranes or skin necrosis: No Has patient had a PCN reaction  that required hospitalization No Has patient had a PCN reaction occurring within the last 10 years: No If all of the above answers are "NO", then may proceed with Cephalosporin use.     DISCHARGE MEDICATIONS:   Allergies as of 04/22/2017      Reactions   Penicillins Rash   Has patient had a PCN reaction causing immediate rash, facial/tongue/throat swelling, SOB or lightheadedness with hypotension: Yes Has patient had a PCN reaction causing severe rash involving mucus membranes or skin necrosis: No Has patient had a PCN reaction that required hospitalization No Has patient had a PCN reaction occurring within the last 10 years: No If all of the above answers are "NO", then may proceed with Cephalosporin use.      Medication List    TAKE these medications   aspirin EC 81 MG tablet Take 81 mg by mouth daily.   atorvastatin 10 MG tablet Commonly known as:  LIPITOR Take 10 mg by mouth daily.   clopidogrel 75 MG tablet Commonly known as:  PLAVIX Take 75 mg by mouth daily.   levETIRAcetam 500 MG tablet Commonly known as:  KEPPRA Take 1 tablet (500 mg total) by mouth 2 (two) times daily. What changed:  when to take this   loperamide 2 MG capsule Commonly known as:  IMODIUM Take 2 mg by mouth 4 (four) times daily as needed for diarrhea or loose stools.   multivitamin with minerals tablet Take 1 tablet by mouth daily.   pantoprazole 40 MG tablet Commonly known as:  PROTONIX Take 40 mg by mouth daily.  terazosin 5 MG capsule Commonly known as:  HYTRIN Take 1 capsule (5 mg total) by mouth 2 (two) times daily. What changed:  when to take this       Today   CHIEF COMPLAINT:   Patient with confusion, Family is at bedside   VITAL SIGNS:  Blood pressure (!) 126/55, pulse 93, temperature 98 F (36.7 C), temperature source Oral, resp. rate 20, height 5\' 8"  (1.727 m), weight 66.3 kg (146 lb 3.2 oz), SpO2 97 %.   REVIEW OF SYSTEMS:  Review of Systems  Unable to  perform ROS: Dementia     PHYSICAL EXAMINATION:  GENERAL:  84 y.o.-year-old patient lying in the bed with no acute distress.  NECK:  Supple, no jugular venous distention. No thyroid enlargement, no tenderness.  LUNGS: Normal breath sounds bilaterally, no wheezing, rales,rhonchi  No use of accessory muscles of respiration.  CARDIOVASCULAR: S1, S2 normal. No murmurs, rubs, or gallops.  ABDOMEN: Soft, non-tender, non-distended. Bowel sounds present. No organomegaly or mass.  EXTREMITIES: No pedal edema, cyanosis, or clubbing.  PSYCHIATRIC: The patient is alert and oriented x name.  SKIN: No obvious rash, lesion, or ulcer.   DATA REVIEW:   CBC Recent Labs  Lab 04/20/17 0226  WBC 5.5  HGB 13.2  HCT 39.5*  PLT 153    Chemistries  Recent Labs  Lab 04/20/17 0226  NA 139  K 3.8  CL 106  CO2 26  GLUCOSE 127*  BUN 16  CREATININE 1.23  CALCIUM 8.6*  AST 19  ALT 10*  ALKPHOS 49  BILITOT 0.6    Cardiac Enzymes Recent Labs  Lab 04/20/17 0226  TROPONINI <0.03    Microbiology Results  @MICRORSLT48 @  RADIOLOGY:  No results found.    Allergies as of 04/22/2017      Reactions   Penicillins Rash   Has patient had a PCN reaction causing immediate rash, facial/tongue/throat swelling, SOB or lightheadedness with hypotension: Yes Has patient had a PCN reaction causing severe rash involving mucus membranes or skin necrosis: No Has patient had a PCN reaction that required hospitalization No Has patient had a PCN reaction occurring within the last 10 years: No If all of the above answers are "NO", then may proceed with Cephalosporin use.      Medication List    TAKE these medications   aspirin EC 81 MG tablet Take 81 mg by mouth daily.   atorvastatin 10 MG tablet Commonly known as:  LIPITOR Take 10 mg by mouth daily.   clopidogrel 75 MG tablet Commonly known as:  PLAVIX Take 75 mg by mouth daily.   levETIRAcetam 500 MG tablet Commonly known as:  KEPPRA Take 1  tablet (500 mg total) by mouth 2 (two) times daily. What changed:  when to take this   loperamide 2 MG capsule Commonly known as:  IMODIUM Take 2 mg by mouth 4 (four) times daily as needed for diarrhea or loose stools.   multivitamin with minerals tablet Take 1 tablet by mouth daily.   pantoprazole 40 MG tablet Commonly known as:  PROTONIX Take 40 mg by mouth daily.   terazosin 5 MG capsule Commonly known as:  HYTRIN Take 1 capsule (5 mg total) by mouth 2 (two) times daily. What changed:  when to take this         Management plans discussed with the patient's family and they are in agreement. Stable for discharge   Patient should follow up with pcp  CODE STATUS:  Code Status Orders  (From admission, onward)        Start     Ordered   04/20/17 0924  Full code  Continuous     04/20/17 0923    Code Status History    Date Active Date Inactive Code Status Order ID Comments User Context   01/05/2016 0616 01/06/2016 1928 Full Code [id number]  Saundra Shelling, MD Inpatient   08/21/2015 0424 08/22/2015 1626 Full Code [id number]  Harrie Foreman, MD Inpatient    Advance Directive Documentation     Most Recent Value  Type of Advance Directive  Healthcare Power of Attorney  Pre-existing out of facility DNR order (yellow form or pink MOST form)  -  "MOST" Form in Place?  -      TOTAL TIME TAKING CARE OF THIS PATIENT: 38 minutes.    Note: This dictation was prepared with Dragon dictation along with smaller phrase technology. Any transcriptional errors that result from this process are unintentional.  Vaughan Basta M.D on 04/22/2017 at 3:34 PM  Between 7am to 6pm - Pager - [phone] After 6pm go to [email or web address] - password [email or web address] [facility]  Office  [phone]  CC: Primary care physician; Rusty Aus, MD

## 2017-04-22 NOTE — Progress Notes (Signed)
Patient is medically stable for D/C to [facility] today. [facility] authorization has been received and patient can come today to room 502. RN will call report and arrange EMS for transport. Clinical Education officer, museum (CSW) sent D/C orders to [facility] via [place]. Patient's wife Romie Minus and daughter Lelon Frohlich are at bedside and aware of above. Please reconsult if future social work needs arise. CSW signing off.   [employer], LCSW [phone]

## 2017-04-25 DIAGNOSIS — Z8673 Personal history of transient ischemic attack (TIA), and cerebral infarction without residual deficits: Secondary | ICD-10-CM | POA: Diagnosis not present

## 2017-04-25 DIAGNOSIS — R634 Abnormal weight loss: Secondary | ICD-10-CM | POA: Diagnosis not present

## 2017-04-25 DIAGNOSIS — G3184 Mild cognitive impairment, so stated: Secondary | ICD-10-CM | POA: Diagnosis not present

## 2017-04-25 DIAGNOSIS — R569 Unspecified convulsions: Secondary | ICD-10-CM | POA: Diagnosis not present

## 2017-04-27 DIAGNOSIS — G40909 Epilepsy, unspecified, not intractable, without status epilepticus: Secondary | ICD-10-CM | POA: Diagnosis not present

## 2017-04-27 DIAGNOSIS — G40219 Localization-related (focal) (partial) symptomatic epilepsy and epileptic syndromes with complex partial seizures, intractable, without status epilepticus: Secondary | ICD-10-CM | POA: Diagnosis not present

## 2017-04-27 DIAGNOSIS — R4189 Other symptoms and signs involving cognitive functions and awareness: Secondary | ICD-10-CM | POA: Diagnosis not present

## 2017-04-27 DIAGNOSIS — G459 Transient cerebral ischemic attack, unspecified: Secondary | ICD-10-CM | POA: Diagnosis not present

## 2017-04-27 DIAGNOSIS — I779 Disorder of arteries and arterioles, unspecified: Secondary | ICD-10-CM | POA: Diagnosis not present

## 2017-05-05 DIAGNOSIS — Z87891 Personal history of nicotine dependence: Secondary | ICD-10-CM | POA: Diagnosis not present

## 2017-05-05 DIAGNOSIS — I69391 Dysphagia following cerebral infarction: Secondary | ICD-10-CM | POA: Diagnosis not present

## 2017-05-05 DIAGNOSIS — I69398 Other sequelae of cerebral infarction: Secondary | ICD-10-CM | POA: Diagnosis not present

## 2017-05-05 DIAGNOSIS — I10 Essential (primary) hypertension: Secondary | ICD-10-CM | POA: Diagnosis not present

## 2017-05-05 DIAGNOSIS — G40909 Epilepsy, unspecified, not intractable, without status epilepticus: Secondary | ICD-10-CM | POA: Diagnosis not present

## 2017-05-05 DIAGNOSIS — R1310 Dysphagia, unspecified: Secondary | ICD-10-CM | POA: Diagnosis not present

## 2017-05-05 DIAGNOSIS — K219 Gastro-esophageal reflux disease without esophagitis: Secondary | ICD-10-CM | POA: Diagnosis not present

## 2017-05-05 DIAGNOSIS — M6281 Muscle weakness (generalized): Secondary | ICD-10-CM | POA: Diagnosis not present

## 2017-05-05 DIAGNOSIS — F0390 Unspecified dementia without behavioral disturbance: Secondary | ICD-10-CM | POA: Diagnosis not present

## 2017-05-05 DIAGNOSIS — Z85828 Personal history of other malignant neoplasm of skin: Secondary | ICD-10-CM | POA: Diagnosis not present

## 2017-05-05 DIAGNOSIS — N400 Enlarged prostate without lower urinary tract symptoms: Secondary | ICD-10-CM | POA: Diagnosis not present

## 2017-05-05 DIAGNOSIS — I69322 Dysarthria following cerebral infarction: Secondary | ICD-10-CM | POA: Diagnosis not present

## 2017-05-05 DIAGNOSIS — E785 Hyperlipidemia, unspecified: Secondary | ICD-10-CM | POA: Diagnosis not present

## 2017-05-11 ENCOUNTER — Other Ambulatory Visit: Payer: Self-pay

## 2017-05-11 NOTE — Patient Outreach (Signed)
[place] [facility]) Care Management  05/11/2017  Albert Boone 05-Mar-1922 [id number]   Transition of care  Referral date: 05/10/17 Referral source: Discharged from [facility] on 05/04/17 Insurance: Health team advantage  Telephone call to patient and/ or wife regarding transition of care referral.  Attempted call to listed mobile number. Unable to leave message. Message states, " voice mail box is not set up yet."   Attempted call to listed home number. Phone only rang.    PLAN: RNCM will attempt 2nd telephone call to patient and/ or wife within 4 business days.  RNCM will send patient outreach letter to attempt contact.   [name] RN,BSN,CCM [facility] Telephonic  [phone]

## 2017-05-16 DIAGNOSIS — I10 Essential (primary) hypertension: Secondary | ICD-10-CM | POA: Diagnosis not present

## 2017-05-16 DIAGNOSIS — I69322 Dysarthria following cerebral infarction: Secondary | ICD-10-CM | POA: Diagnosis not present

## 2017-05-16 DIAGNOSIS — I69398 Other sequelae of cerebral infarction: Secondary | ICD-10-CM | POA: Diagnosis not present

## 2017-05-16 DIAGNOSIS — G40909 Epilepsy, unspecified, not intractable, without status epilepticus: Secondary | ICD-10-CM | POA: Diagnosis not present

## 2017-05-16 DIAGNOSIS — N400 Enlarged prostate without lower urinary tract symptoms: Secondary | ICD-10-CM | POA: Diagnosis not present

## 2017-05-16 DIAGNOSIS — R1310 Dysphagia, unspecified: Secondary | ICD-10-CM | POA: Diagnosis not present

## 2017-05-16 DIAGNOSIS — F0390 Unspecified dementia without behavioral disturbance: Secondary | ICD-10-CM | POA: Diagnosis not present

## 2017-05-16 DIAGNOSIS — M6281 Muscle weakness (generalized): Secondary | ICD-10-CM | POA: Diagnosis not present

## 2017-05-16 DIAGNOSIS — K219 Gastro-esophageal reflux disease without esophagitis: Secondary | ICD-10-CM | POA: Diagnosis not present

## 2017-05-16 DIAGNOSIS — I69391 Dysphagia following cerebral infarction: Secondary | ICD-10-CM | POA: Diagnosis not present

## 2017-05-17 ENCOUNTER — Other Ambulatory Visit: Payer: Self-pay

## 2017-05-17 NOTE — Patient Outreach (Signed)
[place] [facility]) Care Management  05/17/2017  Albert Boone 1922/12/19 [id number]    Transition of care  Referral date: 05/10/17 Referral source: Discharged from [facility] on 05/04/17 Insurance: Health team advantage  Telephone call to patient regarding transition of care follow up. HIPAA verified with patient. Patient gave verbal authorization to speak with his wife, Albert Boone.  Patient states he is not able to hear RNCM on the phone. Wife states patient is doing well.  States patient is receiving home health therapy services. Wife unsure of name of agency. States she and patients daughter handles this.  Patient reports his next follow up appointment with his primary MD is in July.  RNCM explained to wife / patient importance of contacting primary MD office for post hospital discharge follow up. Wife states patient is being followed by home health and he is doing very good. Wife denies patient having any symptoms / concerns.  RNCM reviewed signs/ symptoms of stroke. Advised to call 911 for stroke like symptoms.  Wife states patient has his medications and takes them as prescribed. Wife did not know patients medication.  Wife states patient handles his own medications and does a very good job. Patient would not review medications with RNCM.  Wife states patient has an upcoming neurology appointment. States she is unsure of exact date.   Wife states patient does not have any concerns and he is doing ok. Denies need for ongoing follow up calls with [facility]  RNCM offered to send patient [facility] care management brochure/ magnet for future reference. Wife verbally agreed.   ASSESSMENT: Patient has difficulty hearing on the phone. Wife attempted to assist patient with RNCM call but poor historian.   PLAN: RNCM will close patient due to refusal of services.  RNCM will send patient [facility] care management brochure / magnet RNCM will send closure notification to patients primary  md.   Albert Plowman RN,BSN,CCM [facility] Telephonic  [phone]

## 2017-05-23 ENCOUNTER — Other Ambulatory Visit: Payer: Self-pay | Admitting: *Deleted

## 2017-05-23 NOTE — Patient Outreach (Signed)
I was able to talk with Mrs. Esperanza Sheets and also Rosezella Rumpf, their daughter today. I was able to advise what our services are and th HTA had made a referral for me to visit with them  With the daughter's approval I have been able to schedule an appt for Wednesday at 11:30 pm.  I have advised that I will have a pharmacy student with me as well and they are in agreement with that.  Eulah Pont. Myrtie Neither, MSN, [facility] Gerontological Nurse Practitioner [facility] Care Management [phone]

## 2017-05-25 ENCOUNTER — Other Ambulatory Visit: Payer: Self-pay | Admitting: *Deleted

## 2017-05-26 ENCOUNTER — Encounter: Payer: Self-pay | Admitting: *Deleted

## 2017-05-26 DIAGNOSIS — E785 Hyperlipidemia, unspecified: Secondary | ICD-10-CM | POA: Insufficient documentation

## 2017-05-26 DIAGNOSIS — N183 Chronic kidney disease, stage 3 unspecified: Secondary | ICD-10-CM | POA: Insufficient documentation

## 2017-05-26 DIAGNOSIS — I4891 Unspecified atrial fibrillation: Secondary | ICD-10-CM | POA: Insufficient documentation

## 2017-05-26 DIAGNOSIS — R351 Nocturia: Secondary | ICD-10-CM

## 2017-05-26 DIAGNOSIS — N401 Enlarged prostate with lower urinary tract symptoms: Secondary | ICD-10-CM | POA: Insufficient documentation

## 2017-05-26 NOTE — Patient Outreach (Signed)
Albert Boone DOB: 03/10/1822  Initial home visit for [facility] care management services (referred by HTA).  S: Mr. Correa has recently returned home from SNF after having a TIA and needing rehabilitation. He resides with his 82 year old wife who is his primary caregiver and in good medical condition but would like additional support to oversee Mr. Anderle in the home vs moving to ALF at this time. Their children who live close by are very supportive and active with the couple to assist them in any way they can.  Review of Systems:Alert legally blind R eye, HOH (has hearing aid but usually doesn't wear it, natural dentition - fair condition, no trouble chewing or swallowing. No chest pain, no dyspnea, no edema.   Physical Exam:  BP 118/60 (BP Location: Left Arm, Patient Position: Sitting, Cuff Size: Normal)   Pulse 91   Resp 16   Ht 1.727 m (5' 8" )   Wt 145 lb (65.8 kg)   SpO2 94%   BMI 22.05 kg/m   Alert and oriented, clear mentation  AFIB Lungs clear No edema Ambulates with cane with "hurricane 3 point" at times. Noted ambulating without cane, abnormal gait, short shuffling steps. While I was there pt went into his office to try to locate his POA and squatted down, sat down and was unable to get up unassisted. His wife was able to assist him to stand.   Outpatient Encounter Medications as of 05/25/2017  Medication Sig  . aspirin EC 81 MG tablet Take 81 mg by mouth daily.  Marland Kitchen atorvastatin (LIPITOR) 10 MG tablet Take 10 mg by mouth daily.  . clopidogrel (PLAVIX) 75 MG tablet Take 75 mg by mouth daily.  Marland Kitchen levETIRAcetam (KEPPRA) 500 MG tablet Take 1 tablet (500 mg total) by mouth 2 (two) times daily. (Patient taking differently: Take 500 mg by mouth every evening. )  . loperamide (IMODIUM) 2 MG capsule Take 2 mg by mouth 4 (four) times daily as needed for diarrhea or loose stools.  . Multiple Vitamins-Minerals (MULTIVITAMIN WITH MINERALS) tablet Take 1 tablet by mouth daily.  .  pantoprazole (PROTONIX) 40 MG tablet Take 40 mg by mouth daily.  Marland Kitchen terazosin (HYTRIN) 5 MG capsule Take 1 capsule (5 mg total) by mouth 2 (two) times daily. (Patient taking differently: Take 5 mg by mouth daily. )   No facility-administered encounter medications on file as of 05/25/2017.     Fall Risk  05/26/2017  Falls in the past year? Yes  Number falls in past yr: 2 or more  Injury with Fall? No  Risk Factor Category  High Fall Risk  Risk for fall due to : History of fall(s);Impaired vision;Impaired balance/gait  Follow up Falls evaluation completed;Falls prevention discussed;Education provided   Depression screen [facility] 2/9 05/26/2017  Decreased Interest 0  Down, Depressed, Hopeless 0  PHQ - 2 Score 0   THN CM Care Plan Problem One     Most Recent Value  Care Plan Problem One  Increasing frailty.  Role Documenting the Problem One  Care Management [place] for Problem One  Active  THN Long Term Goal   Pt and family will have located and hired additional caregiving assistance by the end of 60 days.  THN Long Term Goal Start Date  05/25/17  Interventions for Problem One Long Term Goal  Discussed options for increasing LOC, ALF, in home assistance private pay.   THN CM Short Term Goal #1   Will provide caregiving resources  within the next 30 days.  THN CM Short Term Goal #1 Start Date  05/25/17    [facility] CM Care Plan Problem Two     Most Recent Value  Care Plan Problem Two  No Advanced Directives in pt chart.  Role Documenting the Problem Two  Care Management Coordinator  Care Plan for Problem Two  Active  THN CM Short Term Goal #1   Complete MOST form today.  THN CM Short Term Goal #1 Start Date  05/25/17  [facility] CM Short Term Goal #1 Met Date   05/25/17  Interventions for Short Term Goal #2   Compled MOST form and posted on the refrigerator. Advised daughter of completion.  THN CM Short Term Goal #2   Acquire copy of HCPOA and Living Will if one exists within 30 days and  scan into medical record.  THN CM Short Term Goal #2 Start Date  05/25/17  Interventions for Short Term Goal #2  Request copy of possible HCPOA and Living will from pt, wife and daughter. Called daughter to request, left message.     A:  Hx Multiple TIAs       AFIB       Hyperlipidemia       CKD III       BPH       Very high risk for falls       Need for additional in home assistance and possible move to ALF.       No Advanced Directives in medical record.  P:  Completed MOST form for both Mr. And Mrs. Esperanza Sheets. Request copy of HCPOA and Living will for chart if it exists, Called daughter Webb Silversmith to request.  Discussed plan of care for adeqate care and oversight. The couple is considering hiring privately someone to assist them. They have also discussed ALF, however, this is not their desire at this time. I will provide resources.  Question on Keppra dose was on 500 mg daily prior to hospitalization and SNF stay then this was increased to 1000 mg daily. Call to MD office to verify desired dose now (MD office responded dose should be 1000 mg daily or 2 500 mg tabs daily).  Encouraged safety precautions - Use can to ambulate at all times! Avoid squatting, pull up a chair, sit to reach lower levels.  I will call couple next week to follow up and call weekly for one month. I will plan on seeing them in one month.  Eulah Pont. Myrtie Neither, MSN, [facility] Gerontological Nurse Practitioner [facility] Care Management [phone]

## 2017-05-31 ENCOUNTER — Other Ambulatory Visit: Payer: Self-pay | Admitting: *Deleted

## 2017-05-31 NOTE — Patient Outreach (Signed)
Transition of care call. No one answered at either phone number and voice mail is not available. I will try to call later on in the day.  Eulah Pont. Myrtie Neither, MSN, [facility] Gerontological Nurse Practitioner [facility] Care Management [phone]

## 2017-06-01 DIAGNOSIS — E785 Hyperlipidemia, unspecified: Secondary | ICD-10-CM | POA: Diagnosis not present

## 2017-06-01 DIAGNOSIS — I69322 Dysarthria following cerebral infarction: Secondary | ICD-10-CM | POA: Diagnosis not present

## 2017-06-01 DIAGNOSIS — M6281 Muscle weakness (generalized): Secondary | ICD-10-CM | POA: Diagnosis not present

## 2017-06-01 DIAGNOSIS — Z87891 Personal history of nicotine dependence: Secondary | ICD-10-CM | POA: Diagnosis not present

## 2017-06-01 DIAGNOSIS — F0390 Unspecified dementia without behavioral disturbance: Secondary | ICD-10-CM | POA: Diagnosis not present

## 2017-06-01 DIAGNOSIS — G40909 Epilepsy, unspecified, not intractable, without status epilepticus: Secondary | ICD-10-CM | POA: Diagnosis not present

## 2017-06-01 DIAGNOSIS — I10 Essential (primary) hypertension: Secondary | ICD-10-CM | POA: Diagnosis not present

## 2017-06-01 DIAGNOSIS — I69398 Other sequelae of cerebral infarction: Secondary | ICD-10-CM | POA: Diagnosis not present

## 2017-06-01 DIAGNOSIS — R1310 Dysphagia, unspecified: Secondary | ICD-10-CM | POA: Diagnosis not present

## 2017-06-01 DIAGNOSIS — Z85828 Personal history of other malignant neoplasm of skin: Secondary | ICD-10-CM | POA: Diagnosis not present

## 2017-06-01 DIAGNOSIS — N400 Enlarged prostate without lower urinary tract symptoms: Secondary | ICD-10-CM | POA: Diagnosis not present

## 2017-06-01 DIAGNOSIS — I69391 Dysphagia following cerebral infarction: Secondary | ICD-10-CM | POA: Diagnosis not present

## 2017-06-01 DIAGNOSIS — K219 Gastro-esophageal reflux disease without esophagitis: Secondary | ICD-10-CM | POA: Diagnosis not present

## 2017-06-07 ENCOUNTER — Other Ambulatory Visit: Payer: Self-pay | Admitting: *Deleted

## 2017-06-07 ENCOUNTER — Encounter: Payer: Self-pay | Admitting: *Deleted

## 2017-06-07 NOTE — Patient Outreach (Addendum)
Late entry for telephone transition of care call on Friday, May 24th. I spoke with [name] and he states he is doing well, no complications, no falls. They have someone coming in for some housekeeping but no one to assist in his care. He reports they have not found their old advanced directives documents but that his daughter is coming back into town and she will find them and make copies, giving copies to MDs.  I am sending out information about 3 local caregiver agencies that they can consider for some in home assistance. I have reinforced that they can call me with any issues. I have advised that I will call again next week.  Eulah Pont. Myrtie Neither, MSN, [facility] Gerontological Nurse Practitioner [facility] Care Management [phone]

## 2017-06-16 ENCOUNTER — Other Ambulatory Visit: Payer: Self-pay | Admitting: *Deleted

## 2017-06-16 NOTE — Patient Outreach (Signed)
Transition of care call. I spoke with [name] today who reports Albert Boone is doing fair. She reports they are going to the MD today. He is not eating well. She asks about something that could possibly help him.  They have not hired additional help in the home at this time. They have visited some ALF facilities and at sometime in the future they may go to [place] which is a reasonable place for them, but not at this time.  No falls.     We discussed that pt should drink 1-3 cans of ensure or other supplement in addition to meals. We also discussed mirtazapine which may be a consideration to increase his appetite. I gave her the spelling so she could ask the MD today for consideration.  I will call again next week for his final transition of care.  Eulah Pont. Myrtie Neither, MSN, [facility] Gerontological Nurse Practitioner [facility] Care Management [phone]

## 2017-06-24 ENCOUNTER — Other Ambulatory Visit: Payer: Self-pay | Admitting: *Deleted

## 2017-06-24 NOTE — Patient Outreach (Signed)
Transition of care call. Albert Boone states, "I'm still here!" When asked how he is doing today. He reports he feels well. He has finished his PT, now he says he will start to go back to the [facility].  He denies having had any falls. He states he doesn't have any needs for me to address today.  I also called and talked with pt's daughter, Albert Boone, who reports she did provide a copy of the pts HCPOA and Living Will and MOST form to his primary care MD.  I have encouraged all of the family to call me if any need arises. I will call again in 2 weeks.  Albert Boone. Albert Neither, MSN, [facility] Gerontological Nurse Practitioner [facility] Care Management [phone]

## 2017-07-08 ENCOUNTER — Other Ambulatory Visit: Payer: Self-pay | Admitting: *Deleted

## 2017-07-08 NOTE — Patient Outreach (Signed)
Transition of care call. No answer and unable to leave a message. I will try to reach them on Monday, 07/11/17.  Albert Boone. Myrtie Neither, MSN, [facility] Gerontological Nurse Practitioner [facility] Care Management [phone]

## 2017-07-11 ENCOUNTER — Other Ambulatory Visit: Payer: Self-pay | Admitting: *Deleted

## 2017-07-11 NOTE — Patient Outreach (Signed)
Transition of care call. Mr. Albert Boone answered the phone today, but it is hard for him to hear over the phone. I repeated my questions several times and he finally seemed to understand. I asked him if he has been to the gym yet and he said, "Not yet." I asked when is he going to start going? And he responded, "Tomorrow!" I reminded him that was one of his goals to work towards to keep his muscles strong and help prevent future falls. I told him I would be making my final call in 2 weeks and it would be great to hear that he has started to go back to the "[facility]"  He thanked me for the call. I'll talk to him again in 2 weeks.  Eulah Pont. Myrtie Neither, MSN, [facility] Gerontological Nurse Practitioner [facility] Care Management [phone]

## 2017-07-18 DIAGNOSIS — E440 Moderate protein-calorie malnutrition: Secondary | ICD-10-CM | POA: Diagnosis not present

## 2017-07-18 DIAGNOSIS — R531 Weakness: Secondary | ICD-10-CM | POA: Diagnosis not present

## 2017-07-22 ENCOUNTER — Other Ambulatory Visit: Payer: Self-pay | Admitting: *Deleted

## 2017-07-22 NOTE — Patient Outreach (Signed)
Final transition of care call. I did not reach this couple today. I have left a message on their voice mail to please return my call.  Albert Boone. Myrtie Neither, MSN, [facility] Gerontological Nurse Practitioner [facility] Care Management [phone]

## 2017-07-25 ENCOUNTER — Encounter: Payer: Self-pay | Admitting: *Deleted

## 2017-07-25 ENCOUNTER — Other Ambulatory Visit: Payer: Self-pay | Admitting: *Deleted

## 2017-07-25 NOTE — Patient Outreach (Signed)
Transition of care case closure. Talked with Albert Boone initially. She reports [facility], he still feels like he is not up to that but they are taking walks around their neighborhood. She says his appetite has been poor and they saw his MD who prescribed Megace to see if that may stimulate his appetite. He does drink ensure with ice cream once or twice a day in addition to small meals. She reports he tripped over a chair and fell to the carpeted floor day before yesterday and abraised his shin. I then talked to Albert Boone, he says he was sore yesterday but is OK today.  I advised that today is my final call. I have encouraged him to be very cautious in moving about and to use his cane or walker at all times. I have encouraged him to be as active as he can be to maintain his muscle strength. I also advised him to take the Megace in the morning with his breakfast instead of at night with his other meds.  I will send Mr. Sherwin and Dr. Sabra Heck closure letters. I have also advised him that he can call me any time in the future for questions or medical advise.  THN CM Care Plan Problem One     Most Recent Value  Care Plan Problem One  Increasing frailty.  Role Documenting the Problem One  Care Management Coordinator  Care Plan for Problem One  Not Active  THN Long Term Goal   Pt and family will have located and hired additional caregiving assistance by the end of 60 days.  THN Long Term Goal Start Date  05/25/17  [facility]N CM Short Term Goal #1   Will provide caregiving resources within the next 30 days.  THN CM Short Term Goal #1 Start Date  05/25/17  [facility] CM Short Term Goal #1 Met Date  06/16/17    [facility] CM Care Plan Problem Two     Most Recent Value  Care Plan Problem Two  No Advanced Directives in pt chart.  Role Documenting the Problem Two  Care Management Coordinator  Care Plan for Problem Two  Not Active  THN CM Short Term Goal #1   Complete MOST form  today.  THN CM Short Term Goal #1 Start Date  05/25/17  THN CM Short Term Goal #1 Met Date   05/25/17  THN CM Short Term Goal #2   Acquire copy of HCPOA and Living Will if one exists within 30 days and scan into medical record.  THN CM Short Term Goal #2 Start Date  05/25/17  [facility] CM Short Term Goal #2 Met Date  06/24/17    [facility] CM Care Plan Problem Three     Most Recent Value  Care Plan Problem Three  High Risk for Falls and loss of some mobility.  Role Documenting the Problem Three  Care Management Coordinator  Care Plan for Problem Three  Active  THN CM Short Term Goal #1   Pt will resume going to the [facility] for exercise in the next 2 weeks.  THN CM Short Term Goal #1 Start Date  06/24/17  Interventions for Short Term Goal #1  Advised he may want to use the walker for extra stability when he is walking in the neighborhood.       Eulah Pont. Myrtie Neither, MSN, [facility] Gerontological Nurse Practitioner [facility] Care Management [phone]

## 2017-07-27 ENCOUNTER — Encounter: Payer: Self-pay | Admitting: *Deleted

## 2017-07-27 DIAGNOSIS — R110 Nausea: Secondary | ICD-10-CM | POA: Diagnosis not present

## 2017-07-27 DIAGNOSIS — R410 Disorientation, unspecified: Secondary | ICD-10-CM | POA: Diagnosis not present

## 2017-07-27 DIAGNOSIS — R630 Anorexia: Secondary | ICD-10-CM | POA: Diagnosis not present

## 2017-07-27 DIAGNOSIS — R634 Abnormal weight loss: Secondary | ICD-10-CM | POA: Diagnosis not present

## 2017-07-31 ENCOUNTER — Other Ambulatory Visit: Payer: Self-pay

## 2017-07-31 ENCOUNTER — Emergency Department
Admission: EM | Admit: 2017-07-31 | Discharge: 2017-07-31 | Disposition: A | Discharge status: Home / Self Care | Payer: PPO | Attending: Emergency Medicine | Admitting: Emergency Medicine

## 2017-07-31 ENCOUNTER — Encounter: Payer: Self-pay | Admitting: Emergency Medicine

## 2017-07-31 ENCOUNTER — Emergency Department: Payer: PPO

## 2017-07-31 DIAGNOSIS — I10 Essential (primary) hypertension: Secondary | ICD-10-CM | POA: Diagnosis not present

## 2017-07-31 DIAGNOSIS — Z79899 Other long term (current) drug therapy: Secondary | ICD-10-CM | POA: Insufficient documentation

## 2017-07-31 DIAGNOSIS — N183 Chronic kidney disease, stage 3 (moderate): Secondary | ICD-10-CM | POA: Diagnosis not present

## 2017-07-31 DIAGNOSIS — I129 Hypertensive chronic kidney disease with stage 1 through stage 4 chronic kidney disease, or unspecified chronic kidney disease: Secondary | ICD-10-CM | POA: Diagnosis not present

## 2017-07-31 DIAGNOSIS — Z7902 Long term (current) use of antithrombotics/antiplatelets: Secondary | ICD-10-CM | POA: Insufficient documentation

## 2017-07-31 DIAGNOSIS — S0990XA Unspecified injury of head, initial encounter: Secondary | ICD-10-CM | POA: Diagnosis not present

## 2017-07-31 DIAGNOSIS — Z87891 Personal history of nicotine dependence: Secondary | ICD-10-CM | POA: Insufficient documentation

## 2017-07-31 DIAGNOSIS — Z8673 Personal history of transient ischemic attack (TIA), and cerebral infarction without residual deficits: Secondary | ICD-10-CM | POA: Insufficient documentation

## 2017-07-31 DIAGNOSIS — S81812A Laceration without foreign body, left lower leg, initial encounter: Secondary | ICD-10-CM

## 2017-07-31 DIAGNOSIS — Z7982 Long term (current) use of aspirin: Secondary | ICD-10-CM | POA: Diagnosis not present

## 2017-07-31 DIAGNOSIS — R1310 Dysphagia, unspecified: Secondary | ICD-10-CM | POA: Diagnosis not present

## 2017-07-31 DIAGNOSIS — R682 Dry mouth, unspecified: Secondary | ICD-10-CM | POA: Insufficient documentation

## 2017-07-31 DIAGNOSIS — F0390 Unspecified dementia without behavioral disturbance: Secondary | ICD-10-CM | POA: Diagnosis not present

## 2017-07-31 LAB — COMPREHENSIVE METABOLIC PANEL
ALT: 10 U/L (ref 0–44)
AST: 17 U/L (ref 15–41)
Albumin: 3.9 g/dL (ref 3.5–5.0)
Alkaline Phosphatase: 46 U/L (ref 38–126)
Anion gap: 6 (ref 5–15)
BUN: 16 mg/dL (ref 8–23)
CO2: 26 mmol/L (ref 22–32)
Calcium: 8.8 mg/dL — ABNORMAL LOW (ref 8.9–10.3)
Chloride: 107 mmol/L (ref 98–111)
Creatinine, Ser: 1.24 mg/dL (ref 0.61–1.24)
GFR calc Af Amer: 56 mL/min — ABNORMAL LOW (ref >60)
GFR calc non Af Amer: 48 mL/min — ABNORMAL LOW (ref >60)
Glucose, Bld: 110 mg/dL — ABNORMAL HIGH (ref 70–99)
POTASSIUM: 3.8 mmol/L (ref 3.5–5.1)
Sodium: 139 mmol/L (ref 135–145)
Total Bilirubin: 0.8 mg/dL (ref 0.3–1.2)
Total Protein: 7.9 g/dL (ref 6.5–8.1)

## 2017-07-31 LAB — CBC WITH DIFFERENTIAL/PLATELET
BASOS ABS: 0 10*3/uL (ref 0–0.1)
BASOS PCT: 1 %
EOS PCT: 2 %
Eosinophils Absolute: 0.1 10*3/uL (ref 0–0.7)
HCT: 41.7 % (ref 40.0–52.0)
Hemoglobin: 14 g/dL (ref 13.0–18.0)
LYMPHS PCT: 17 %
Lymphs Abs: 1.1 10*3/uL (ref 1.0–3.6)
MCH: 30.7 pg (ref 26.0–34.0)
MCHC: 33.6 g/dL (ref 32.0–36.0)
MCV: 91.2 fL (ref 80.0–100.0)
MONO ABS: 0.5 10*3/uL (ref 0.2–1.0)
Monocytes Relative: 7 %
NEUTROS ABS: 4.9 10*3/uL (ref 1.4–6.5)
Neutrophils Relative %: 73 %
PLATELETS: 153 10*3/uL (ref 150–440)
RBC: 4.57 MIL/uL (ref 4.40–5.90)
RDW: 14.2 % (ref 11.5–14.5)
WBC: 6.7 10*3/uL (ref 3.8–10.6)

## 2017-07-31 LAB — TROPONIN I: Troponin I: <0.03 ng/mL (ref <0.03)

## 2017-07-31 LAB — LIPASE, BLOOD: Lipase: 58 U/L — ABNORMAL HIGH (ref 11–51)

## 2017-07-31 NOTE — ED Triage Notes (Addendum)
Pt arrived via POV with reports of waking up with dry mouth, pt states he was not able to swallow because his mouth is dry.  Family states that he has not been feeling well for the past week. Pt states he did have an upset stomach earlier in the week, but not currently.   Pt had labs drawn recently.  Pt states he has been drinking water and gatorade.   Pt has hx of CVA and TIAs

## 2017-07-31 NOTE — ED Provider Notes (Addendum)
[facility] Emergency Department Provider Note ____________________________________________   First MD Initiated Contact with Patient 07/31/17 [phone]     (approximate)  I have reviewed the triage vital signs and the nursing notes.   HISTORY  Chief Complaint Dysphagia  HPI Albert Boone is a 84 y.o. male with a history of A. fib, dementia, hypertension and strokes on Plavix who is presenting to the emergency department today with difficulty swallowing and dry mouth this morning.  He says that he woke up with the symptoms but is not been feeling well over the past 2 weeks.  He says that he is having a "nervous feeling" in his stomach.  He was evaluated at his primary care doctor's office last week and had reassuring lab work.  Negative urinalysis at that time as well.  Patient denying any pain at this time.  Says that he had water to drink this morning and now he feels like he is swallowing easier and without dry mouth.  Does not note any new change in his voice or note any weakness, focally anywhere else.  Past Medical History:  Diagnosis Date  . Dementia   . GERD (gastroesophageal reflux disease)   . Hyperlipemia   . Hypertension   . Prostatitis   . Stroke [facility])    mini strokes    Patient Active Problem List   Diagnosis Date Noted  . A-fib ([place]) 05/26/2017  . Hyperlipidemia 05/26/2017  . CKD (chronic kidney disease) stage 3, GFR 30-59 ml/min (HCC) 05/26/2017  . BPH associated with nocturia 05/26/2017  . TIA (transient ischemic attack) 04/20/2017  . Seizure ([place]) 01/05/2016  . Sepsis ([place]) 08/21/2015    Past Surgical History:  Procedure Laterality Date  . SKIN CANCER EXCISION     multiple times    Prior to Admission medications   Medication Sig Start Date End Date Taking? Authorizing Provider  aspirin EC 81 MG tablet Take 81 mg by mouth daily.    [provider]  atorvastatin (LIPITOR) 10 MG tablet Take 10 mg by mouth daily.     [provider]  clopidogrel (PLAVIX) 75 MG tablet Take 75 mg by mouth daily.    [provider]  levETIRAcetam (KEPPRA) 500 MG tablet Take 1 tablet (500 mg total) by mouth 2 (two) times daily. Patient taking differently: Take 500 mg by mouth every evening.  01/06/16   Demetrios Loll, MD  loperamide (IMODIUM) 2 MG capsule Take 2 mg by mouth 4 (four) times daily as needed for diarrhea or loose stools.    [provider]  Multiple Vitamins-Minerals (MULTIVITAMIN WITH MINERALS) tablet Take 1 tablet by mouth daily.    [provider]  pantoprazole (PROTONIX) 40 MG tablet Take 40 mg by mouth daily.    [provider]  terazosin (HYTRIN) 5 MG capsule Take 1 capsule (5 mg total) by mouth 2 (two) times daily. Patient taking differently: Take 5 mg by mouth daily.  08/18/15   Bjorn Loser, MD    Allergies Penicillins  Family History  Problem Relation Age of Onset  . Hypertension Father   . Prostate cancer Neg Hx   . Hematuria Neg Hx   . Kidney disease Neg Hx     Social History Social History   Tobacco Use  . Smoking status: Former Smoker    Types: Cigarettes    Last attempt to quit: 08/18/1966    Years since quitting: 50.9  . Smokeless tobacco: Never Used  Substance Use  Topics  . Alcohol use: Yes    Alcohol/week: 2.4 oz    Types: 4 Glasses of wine per week  . Drug use: No    Review of Systems  Constitutional: No fever/chills Eyes: No visual changes. ENT: No sore throat. Cardiovascular: Denies chest pain. Respiratory: Denies shortness of breath. Gastrointestinal: No abdominal pain.  No nausea, no vomiting.  No diarrhea.  No constipation. Genitourinary: Negative for dysuria. Musculoskeletal: Negative for back pain. Skin: Negative for rash. Neurological: Negative for headaches, focal weakness or numbness.   ____________________________________________   PHYSICAL EXAM:  VITAL SIGNS: ED Triage Vitals  Enc Vitals Group     BP  07/31/17 0827 (!) 132/49     Pulse Rate 07/31/17 0827 (!) 103     Resp 07/31/17 0827 18     Temp 07/31/17 0830 (!) 97.5 F (36.4 C)     Temp Source 07/31/17 0830 Oral     SpO2 07/31/17 0827 95 %     Weight 07/31/17 0829 130 lb (59 kg)     Height 07/31/17 0829 5\' 8"  (1.727 m)     Head Circumference --      Peak Flow --      Pain Score 07/31/17 0829 0     Pain Loc --      Pain Edu? --      Excl. in [place]? --     Constitutional: Alert and oriented. Well appearing and in no acute distress. Eyes: Conjunctivae are normal.  Head: Atraumatic. Nose: No congestion/rhinnorhea. Mouth/Throat: Mucous membranes are moist.  Neck: No stridor.   Cardiovascular: Normal rate, regular rhythm. Grossly normal heart sounds.  Respiratory: Normal respiratory effort.  No retractions. Lungs CTAB. Gastrointestinal: Soft and nontender. No distention. No CVA tenderness. Musculoskeletal: No lower extremity tenderness nor edema.  No joint effusions. Neurologic:  Normal speech and language. No gross focal neurologic deficits are appreciated. Skin: 3 cm, superficial circular skin tear to the anterior left shin just proximal to the left ankle.  No active bleeding, no surrounding erythema or exudate. Psychiatric: Mood and affect are normal. Speech and behavior are normal.  ____________________________________________   LABS (all labs ordered are listed, but only abnormal results are displayed)  Labs Reviewed  COMPREHENSIVE METABOLIC PANEL - Abnormal; Notable for the following components:      Result Value   Glucose, Bld 110 (*)    Calcium 8.8 (*)    GFR calc non Af Amer 48 (*)    GFR calc Af Amer 56 (*)    All other components within normal limits  LIPASE, BLOOD - Abnormal; Notable for the following components:   Lipase 58 (*)    All other components within normal limits  CBC WITH DIFFERENTIAL/PLATELET  TROPONIN I  URINALYSIS, COMPLETE (UACMP) WITH MICROSCOPIC    ____________________________________________  EKG  ED ECG REPORT I, Doran Stabler, the attending physician, personally viewed and interpreted this ECG.   Date: 07/31/2017  EKG Time: 0846  Rate: 103  Rhythm: atrial fibrillation, rate 103  Axis: Normal  Intervals:nonspecific intraventricular conduction delay  ST&T Change: No ST segment elevation or depression.  T wave inversions in 1, 2, aVL, V2 and V3. No significant change compared to previous EKGs on record. ____________________________________________  RADIOLOGY  CT head without any acute finding. ____________________________________________   PROCEDURES  Procedure(s) performed:   Procedures  Critical Care performed:   ____________________________________________   INITIAL IMPRESSION / ASSESSMENT AND PLAN / ED COURSE  Pertinent labs & imaging results that were available  during my care of the patient were reviewed by me and considered in my medical decision making (see chart for details).  DDX: Reflux, dry mouth, postnasal drip, CVA As part of my medical decision making, I reviewed the following data within the [place] Notes from prior ED visits as well as recent outpatient visit  Unclear etiology of patient's dry mouth.  Concerning for possible CVA with bulbar symptoms.  No facial droop or focal weakness reported.  We will evaluate further with labs and imaging.  ----------------------------------------- 10:37 AM on 07/31/2017 -----------------------------------------  Patient at this time back to baseline.  Says that he is feeling ready to go home.  Says that he thinks that the Pedialyte the drink this morning helped most with his symptoms.  I reexamined his abdomen and he still without any tenderness to palpation despite the elevated lipase.  He will be discharged home at this time.  I encouraged him to continue drinking Pedialyte as needed.  He appears to be at his baseline mentation  without any focal deficits.  He will follow-up as an outpatient with Dr. Sabra Heck.  He is understanding of the treatment and willing to comply.  Unclear exact cause of the dry mouth this morning.  Possible postnasal drip.  Does not appear dehydrated.  Heart rate in the room on the monitor is 80 bpm. ____________________________________________   FINAL CLINICAL IMPRESSION(S) / ED DIAGNOSES  Dry mouth.  Dysphasia.skin tear  NEW MEDICATIONS STARTED DURING THIS VISIT:  New Prescriptions   No medications on file     Note:  This document was prepared using Dragon voice recognition software and may include unintentional dictation errors.     Orbie Pyo, MD 07/31/17 1038    Dafne Nield, Randall An, MD 07/31/17 1041

## 2017-08-08 DIAGNOSIS — R410 Disorientation, unspecified: Secondary | ICD-10-CM | POA: Diagnosis not present

## 2017-08-08 DIAGNOSIS — E440 Moderate protein-calorie malnutrition: Secondary | ICD-10-CM | POA: Diagnosis not present

## 2017-08-19 ENCOUNTER — Encounter: Payer: Self-pay | Admitting: Emergency Medicine

## 2017-08-19 ENCOUNTER — Other Ambulatory Visit: Payer: Self-pay

## 2017-08-19 ENCOUNTER — Emergency Department
Admission: EM | Admit: 2017-08-19 | Discharge: 2017-08-19 | Disposition: A | Discharge status: Home / Self Care | Payer: PPO | Attending: Student in an Organized Health Care Education/Training Program | Admitting: Student in an Organized Health Care Education/Training Program

## 2017-08-19 ENCOUNTER — Emergency Department: Payer: PPO

## 2017-08-19 DIAGNOSIS — Z87891 Personal history of nicotine dependence: Secondary | ICD-10-CM | POA: Diagnosis not present

## 2017-08-19 DIAGNOSIS — Z7902 Long term (current) use of antithrombotics/antiplatelets: Secondary | ICD-10-CM | POA: Insufficient documentation

## 2017-08-19 DIAGNOSIS — F0390 Unspecified dementia without behavioral disturbance: Secondary | ICD-10-CM | POA: Diagnosis not present

## 2017-08-19 DIAGNOSIS — N183 Chronic kidney disease, stage 3 (moderate): Secondary | ICD-10-CM | POA: Insufficient documentation

## 2017-08-19 DIAGNOSIS — I129 Hypertensive chronic kidney disease with stage 1 through stage 4 chronic kidney disease, or unspecified chronic kidney disease: Secondary | ICD-10-CM | POA: Diagnosis not present

## 2017-08-19 DIAGNOSIS — R002 Palpitations: Secondary | ICD-10-CM | POA: Insufficient documentation

## 2017-08-19 DIAGNOSIS — I4891 Unspecified atrial fibrillation: Secondary | ICD-10-CM | POA: Diagnosis not present

## 2017-08-19 DIAGNOSIS — Z8673 Personal history of transient ischemic attack (TIA), and cerebral infarction without residual deficits: Secondary | ICD-10-CM | POA: Insufficient documentation

## 2017-08-19 DIAGNOSIS — R0602 Shortness of breath: Secondary | ICD-10-CM | POA: Diagnosis not present

## 2017-08-19 DIAGNOSIS — Z7982 Long term (current) use of aspirin: Secondary | ICD-10-CM | POA: Diagnosis not present

## 2017-08-19 DIAGNOSIS — J811 Chronic pulmonary edema: Secondary | ICD-10-CM | POA: Diagnosis not present

## 2017-08-19 DIAGNOSIS — I959 Hypotension, unspecified: Secondary | ICD-10-CM | POA: Diagnosis not present

## 2017-08-19 DIAGNOSIS — R5383 Other fatigue: Secondary | ICD-10-CM | POA: Diagnosis not present

## 2017-08-19 DIAGNOSIS — Z79899 Other long term (current) drug therapy: Secondary | ICD-10-CM | POA: Diagnosis not present

## 2017-08-19 LAB — CBC
HCT: 40.7 % (ref 40.0–52.0)
HEMOGLOBIN: 13.6 g/dL (ref 13.0–18.0)
MCH: 30.7 pg (ref 26.0–34.0)
MCHC: 33.3 g/dL (ref 32.0–36.0)
MCV: 92.2 fL (ref 80.0–100.0)
Platelets: 143 10*3/uL — ABNORMAL LOW (ref 150–440)
RBC: 4.41 MIL/uL (ref 4.40–5.90)
RDW: 14.1 % (ref 11.5–14.5)
WBC: 6.7 10*3/uL (ref 3.8–10.6)

## 2017-08-19 LAB — COMPREHENSIVE METABOLIC PANEL
ALBUMIN: 3.8 g/dL (ref 3.5–5.0)
ALT: 10 U/L (ref 0–44)
ANION GAP: 9 (ref 5–15)
AST: 19 U/L (ref 15–41)
Alkaline Phosphatase: 47 U/L (ref 38–126)
BUN: 20 mg/dL (ref 8–23)
CO2: 24 mmol/L (ref 22–32)
Calcium: 8.8 mg/dL — ABNORMAL LOW (ref 8.9–10.3)
Chloride: 108 mmol/L (ref 98–111)
Creatinine, Ser: 1.11 mg/dL (ref 0.61–1.24)
GFR calc Af Amer: >60 mL/min (ref >60)
GFR calc non Af Amer: 55 mL/min — ABNORMAL LOW (ref >60)
GLUCOSE: 117 mg/dL — AB (ref 70–99)
POTASSIUM: 4.4 mmol/L (ref 3.5–5.1)
SODIUM: 141 mmol/L (ref 135–145)
Total Bilirubin: 0.9 mg/dL (ref 0.3–1.2)
Total Protein: 7.7 g/dL (ref 6.5–8.1)

## 2017-08-19 LAB — TROPONIN I: Troponin I: <0.03 ng/mL (ref <0.03)

## 2017-08-19 LAB — BRAIN NATRIURETIC PEPTIDE: B NATRIURETIC PEPTIDE 5: 151 pg/mL — AB (ref 0.0–100.0)

## 2017-08-19 LAB — MAGNESIUM: Magnesium: 2.5 mg/dL — ABNORMAL HIGH (ref 1.7–2.4)

## 2017-08-19 MED ORDER — SODIUM CHLORIDE 0.9 % IV BOLUS
500.0000 mL | Freq: Once | INTRAVENOUS | Status: AC
  Administered 2017-08-19: 500 mL via INTRAVENOUS

## 2017-08-19 MED ORDER — DILTIAZEM HCL ER COATED BEADS 120 MG PO CP24
120.0000 mg | ORAL_CAPSULE | Freq: Once | ORAL | Status: AC
Start: 2017-08-19 — End: 2017-08-19
  Administered 2017-08-19: 120 mg via ORAL
  Filled 2017-08-19 (×2): qty 1

## 2017-08-19 MED ORDER — MAGNESIUM SULFATE 2 GM/50ML IV SOLN
2.0000 g | Freq: Once | INTRAVENOUS | Status: AC
  Administered 2017-08-19: 2 g via INTRAVENOUS
  Filled 2017-08-19: qty 50

## 2017-08-19 MED ORDER — DILTIAZEM HCL ER COATED BEADS 120 MG PO CP24: 120.0000 mg | ORAL_CAPSULE | Freq: Every day | ORAL | 1 refills | Status: DC

## 2017-08-19 NOTE — ED Provider Notes (Signed)
[facility] Emergency Department Provider Note    First MD Initiated Contact with Patient 08/19/17 1242     (approximate)  I have reviewed the triage vital signs and the nursing notes.   HISTORY  Chief Complaint Irregular Heart Beat    HPI Albert Boone is a 84 y.o. male history of dementia as well as hypertension paroxysmal A. fib resents to the ER with chief complaint of palpitations and shortness of breath walking from room to room.  Patient had follow-up with his PCP today who thought to be in fast A. fib was also having reportedly low blood pressure.  According to family has been having difficulty with appetite over the past several weeks.  Recently started on new appetite stimulant with some improvement.  He is not on any medication for rate control of his A. fib.  Does take aspirin Plavix for history of TIA.  Denies any numbness or tingling.  No headaches.  No chest pain.  No nausea or vomiting.  No fevers.    Past Medical History:  Diagnosis Date  . Dementia   . GERD (gastroesophageal reflux disease)   . Hyperlipemia   . Hypertension   . Prostatitis   . Stroke [facility])    mini strokes   Family History  Problem Relation Age of Onset  . Hypertension Father   . Prostate cancer Neg Hx   . Hematuria Neg Hx   . Kidney disease Neg Hx    Past Surgical History:  Procedure Laterality Date  . SKIN CANCER EXCISION     multiple times   Patient Active Problem List   Diagnosis Date Noted  . A-fib ([place]) 05/26/2017  . Hyperlipidemia 05/26/2017  . CKD (chronic kidney disease) stage 3, GFR 30-59 ml/min (HCC) 05/26/2017  . BPH associated with nocturia 05/26/2017  . TIA (transient ischemic attack) 04/20/2017  . Seizure ([place]) 01/05/2016  . Sepsis ([place]) 08/21/2015      Prior to Admission medications   Medication Sig Start Date End Date Taking? Authorizing Provider  aspirin EC 81 MG tablet Take 81 mg by mouth daily.    [provider]  atorvastatin (LIPITOR) 10 MG tablet Take 10 mg by mouth daily.    [provider]  clopidogrel (PLAVIX) 75 MG tablet Take 75 mg by mouth daily.    [provider]  diltiazem (CARDIZEM CD) 120 MG 24 hr capsule Take 1 capsule (120 mg total) by mouth daily. 08/19/17 08/19/18  Merlyn Lot, MD  levETIRAcetam (KEPPRA) 500 MG tablet Take 1 tablet (500 mg total) by mouth 2 (two) times daily. Patient taking differently: Take 500 mg by mouth every evening.  01/06/16   Demetrios Loll, MD  loperamide (IMODIUM) 2 MG capsule Take 2 mg by mouth 4 (four) times daily as needed for diarrhea or loose stools.    [provider]  Multiple Vitamins-Minerals (MULTIVITAMIN WITH MINERALS) tablet Take 1 tablet by mouth daily.    [provider]  pantoprazole (PROTONIX) 40 MG tablet Take 40 mg by mouth daily.    [provider]  terazosin (HYTRIN) 5 MG capsule Take 1 capsule (5 mg total) by mouth 2 (two) times daily. Patient taking differently: Take 5 mg by mouth daily.  08/18/15   Bjorn Loser, MD    Allergies Penicillins    Social History Social History   Tobacco Use  . Smoking status: Former Smoker    Types: Cigarettes    Last attempt to quit: 08/18/1966  Years since quitting: 51.0  . Smokeless tobacco: Never Used  Substance Use Topics  . Alcohol use: Yes    Alcohol/week: 4.0 standard drinks    Types: 4 Glasses of wine per week  . Drug use: No    Review of Systems Patient denies headaches, rhinorrhea, blurry vision, numbness, shortness of breath, chest pain, edema, cough, abdominal pain, nausea, vomiting, diarrhea, dysuria, fevers, rashes or hallucinations unless otherwise stated above in HPI. ____________________________________________   PHYSICAL EXAM:  VITAL SIGNS: Vitals:   08/19/17 1507 08/19/17 1600  BP: (!) 161/95 (!) 174/78  Pulse: 71 70  Resp: 18 (!) 24  Temp:    SpO2: 100% 100%    Constitutional: Alert, frail and elderly appearing  in NAD Eyes: Conjunctivae are normal.  Head: Atraumatic. Nose: No congestion/rhinnorhea. Mouth/Throat: Mucous membranes are moist.   Neck: No stridor. Painless ROM.  Cardiovascular: tachycardic, irregularly irregular rhythm. Grossly normal heart sounds.  Good peripheral circulation. Respiratory: Normal respiratory effort.  No retractions. Lungs CTAB. Gastrointestinal: Soft and nontender. No distention. No abdominal bruits. No CVA tenderness. Genitourinary: deferred Musculoskeletal: No lower extremity tenderness nor edema.  No joint effusions. Neurologic:  Normal speech and language. No gross focal neurologic deficits are appreciated. No facial droop Skin:  Skin is warm, dry and intact. No rash noted. Psychiatric: Mood and affect are normal. Speech and behavior are normal.  ____________________________________________   LABS (all labs ordered are listed, but only abnormal results are displayed)  Results for orders placed or performed during the hospital encounter of 08/19/17 (from the past 24 hour(s))  CBC     Status: Abnormal   Collection Time: 08/19/17 12:39 PM  Result Value Ref Range   WBC 6.7 3.8 - 10.6 K/uL   RBC 4.41 4.40 - 5.90 MIL/uL   Hemoglobin 13.6 13.0 - 18.0 g/dL   HCT 40.7 40.0 - 52.0 %   MCV 92.2 80.0 - 100.0 fL   MCH 30.7 26.0 - 34.0 pg   MCHC 33.3 32.0 - 36.0 g/dL   RDW 14.1 11.5 - 14.5 %   Platelets 143 (L) 150 - 440 K/uL  Comprehensive metabolic panel     Status: Abnormal   Collection Time: 08/19/17 12:39 PM  Result Value Ref Range   Sodium 141 135 - 145 mmol/L   Potassium 4.4 3.5 - 5.1 mmol/L   Chloride 108 98 - 111 mmol/L   CO2 24 22 - 32 mmol/L   Glucose, Bld 117 (H) 70 - 99 mg/dL   BUN 20 8 - 23 mg/dL   Creatinine, Ser 1.11 0.61 - 1.24 mg/dL   Calcium 8.8 (L) 8.9 - 10.3 mg/dL   Total Protein 7.7 6.5 - 8.1 g/dL   Albumin 3.8 3.5 - 5.0 g/dL   AST 19 15 - 41 U/L   ALT 10 0 - 44 U/L   Alkaline Phosphatase 47 38 - 126 U/L   Total Bilirubin 0.9 0.3 -  1.2 mg/dL   GFR calc non Af Amer 55 (L) >60 mL/min   GFR calc Af Amer >60 >60 mL/min   Anion gap 9 5 - 15  Magnesium     Status: Abnormal   Collection Time: 08/19/17 12:39 PM  Result Value Ref Range   Magnesium 2.5 (H) 1.7 - 2.4 mg/dL  Troponin I     Status: None   Collection Time: 08/19/17 12:39 PM  Result Value Ref Range   Troponin I <0.03 <0.03 ng/mL  Brain natriuretic peptide     Status: Abnormal  Collection Time: 08/19/17 12:39 PM  Result Value Ref Range   B Natriuretic Peptide 151.0 (H) 0.0 - 100.0 pg/mL   ____________________________________________  EKG My review and personal interpretation at Time: 12:16   Indication: palpitations  Rate: 103  Rhythm: afib Axis: normal Other: non specific st abn, no stemi ____________________________________________  RADIOLOGY  I personally reviewed all radiographic images ordered to evaluate for the above acute complaints and reviewed radiology reports and findings.  These findings were personally discussed with the patient.  Please see medical record for radiology report.  ____________________________________________   PROCEDURES  Procedure(s) performed:  Procedures    Critical Care performed: no ____________________________________________   INITIAL IMPRESSION / ASSESSMENT AND PLAN / ED COURSE  Pertinent labs & imaging results that were available during my care of the patient were reviewed by me and considered in my medical decision making (see chart for details).   DDX: Dysrhythmia, dehydration, CHF, medication effect, orthostasis  Albert Boone is a 84 y.o. who presents to the ED with symptoms as described above.  Patient afebrile mild tachycardia with evidence of rapid A. fib but otherwise normotensive.  EKG shows no ischemic changes.  Denies any chest pains.  Will place patient on cardiac monitor.  Will check blood work for the above differential.  Will provide gentle IV hydration as well as magnesium for  rapid A. fib.  Peer to be admitting diagnosis of patient not on any antidysrhythmic medication.  Clinical Course as of Aug 19 1605  Fri Aug 19, 2017  1438 Heart rate significant improved after bolus of IV fluid.  Still and A. fib but rate is 80s to 90s.  No hypoxia at rest.  Will ambulate to evaluate for respiratory failure.   [PR]  1559 Discussed case with family at bedside.  Will start low-dose Cardizem as he is still having episodes of tachycardia going into the 1 teens.  He has plenty of room on his blood pressure to tolerate Cardizem.  Is not having any orthostasis.  Discuss stroke prevention in A. fib as the patient is a high clinical risk based on chads vascular score however he also has a has bled score of 3 making him high risk for major bleeding.  After discussion with family will defer anticoagulation at this point as they recognize the significant risks and complications associated with this.Have discussed with the patient and available family all diagnostics and treatments performed thus far and all questions were answered to the best of my ability. The patient demonstrates understanding and agreement with plan.    [PR]    Clinical Course User Index [PR] Merlyn Lot, MD     As part of my medical decision making, I reviewed the following data within the [place] notes reviewed and incorporated, Labs reviewed, notes from prior ED visits.  ____________________________________________   FINAL CLINICAL IMPRESSION(S) / ED DIAGNOSES  Final diagnoses:  Palpitations  Atrial fibrillation with rapid ventricular response (HCC)      NEW MEDICATIONS STARTED DURING THIS VISIT:  New Prescriptions   DILTIAZEM (CARDIZEM CD) 120 MG 24 HR CAPSULE    Take 1 capsule (120 mg total) by mouth daily.     Note:  This document was prepared using Dragon voice recognition software and may include unintentional dictation errors.    Merlyn Lot, MD 08/19/17  [phone]

## 2017-08-19 NOTE — ED Notes (Addendum)
Jonni Sanger EMT/ED Tech ambulating patient. Per Jonni Sanger pt did well ambulating. Pulse ox stayed at 97-98% and HR remained irregular int he 80-90s.

## 2017-08-19 NOTE — ED Triage Notes (Signed)
Pt here with c/o irregular heart beat sent from [facility]. Pt appears in NAD, denies pain, states his heart "beats off" when he is walking from room to room in the house.

## 2017-08-23 DIAGNOSIS — N183 Chronic kidney disease, stage 3 (moderate): Secondary | ICD-10-CM | POA: Diagnosis not present

## 2017-08-23 DIAGNOSIS — I739 Peripheral vascular disease, unspecified: Secondary | ICD-10-CM | POA: Diagnosis not present

## 2017-08-23 DIAGNOSIS — R569 Unspecified convulsions: Secondary | ICD-10-CM | POA: Diagnosis not present

## 2017-08-23 DIAGNOSIS — I10 Essential (primary) hypertension: Secondary | ICD-10-CM | POA: Diagnosis not present

## 2017-08-23 DIAGNOSIS — I480 Paroxysmal atrial fibrillation: Secondary | ICD-10-CM | POA: Diagnosis not present

## 2017-08-23 DIAGNOSIS — E782 Mixed hyperlipidemia: Secondary | ICD-10-CM | POA: Diagnosis not present

## 2017-08-23 DIAGNOSIS — G459 Transient cerebral ischemic attack, unspecified: Secondary | ICD-10-CM | POA: Diagnosis not present

## 2017-09-01 DIAGNOSIS — R300 Dysuria: Secondary | ICD-10-CM | POA: Diagnosis not present

## 2017-09-01 DIAGNOSIS — I10 Essential (primary) hypertension: Secondary | ICD-10-CM | POA: Diagnosis not present

## 2017-09-01 DIAGNOSIS — I951 Orthostatic hypotension: Secondary | ICD-10-CM | POA: Diagnosis not present

## 2017-09-01 DIAGNOSIS — I482 Chronic atrial fibrillation: Secondary | ICD-10-CM | POA: Diagnosis not present

## 2017-09-04 ENCOUNTER — Encounter: Payer: Self-pay | Admitting: Emergency Medicine

## 2017-09-04 ENCOUNTER — Other Ambulatory Visit: Payer: Self-pay

## 2017-09-04 ENCOUNTER — Emergency Department
Admission: EM | Admit: 2017-09-04 | Discharge: 2017-09-04 | Disposition: A | Discharge status: Home / Self Care | Payer: PPO | Attending: Emergency Medicine | Admitting: Emergency Medicine

## 2017-09-04 DIAGNOSIS — R339 Retention of urine, unspecified: Secondary | ICD-10-CM

## 2017-09-04 DIAGNOSIS — N183 Chronic kidney disease, stage 3 (moderate): Secondary | ICD-10-CM | POA: Insufficient documentation

## 2017-09-04 DIAGNOSIS — F0390 Unspecified dementia without behavioral disturbance: Secondary | ICD-10-CM | POA: Insufficient documentation

## 2017-09-04 DIAGNOSIS — Z79899 Other long term (current) drug therapy: Secondary | ICD-10-CM | POA: Diagnosis not present

## 2017-09-04 DIAGNOSIS — Z87891 Personal history of nicotine dependence: Secondary | ICD-10-CM | POA: Diagnosis not present

## 2017-09-04 DIAGNOSIS — Z7902 Long term (current) use of antithrombotics/antiplatelets: Secondary | ICD-10-CM | POA: Diagnosis not present

## 2017-09-04 DIAGNOSIS — I4891 Unspecified atrial fibrillation: Secondary | ICD-10-CM | POA: Insufficient documentation

## 2017-09-04 DIAGNOSIS — I129 Hypertensive chronic kidney disease with stage 1 through stage 4 chronic kidney disease, or unspecified chronic kidney disease: Secondary | ICD-10-CM | POA: Diagnosis not present

## 2017-09-04 DIAGNOSIS — Z8673 Personal history of transient ischemic attack (TIA), and cerebral infarction without residual deficits: Secondary | ICD-10-CM | POA: Insufficient documentation

## 2017-09-04 DIAGNOSIS — Z7982 Long term (current) use of aspirin: Secondary | ICD-10-CM | POA: Diagnosis not present

## 2017-09-04 DIAGNOSIS — E785 Hyperlipidemia, unspecified: Secondary | ICD-10-CM | POA: Diagnosis not present

## 2017-09-04 LAB — BASIC METABOLIC PANEL
ANION GAP: 6 (ref 5–15)
BUN: 20 mg/dL (ref 8–23)
CALCIUM: 8.6 mg/dL — AB (ref 8.9–10.3)
CO2: 24 mmol/L (ref 22–32)
Chloride: 109 mmol/L (ref 98–111)
Creatinine, Ser: 1.26 mg/dL — ABNORMAL HIGH (ref 0.61–1.24)
GFR, EST AFRICAN AMERICAN: 55 mL/min — AB (ref >60)
GFR, EST NON AFRICAN AMERICAN: 47 mL/min — AB (ref >60)
Glucose, Bld: 108 mg/dL — ABNORMAL HIGH (ref 70–99)
Potassium: 4.3 mmol/L (ref 3.5–5.1)
SODIUM: 139 mmol/L (ref 135–145)

## 2017-09-04 LAB — URINALYSIS, COMPLETE (UACMP) WITH MICROSCOPIC
BACTERIA UA: NONE SEEN
BILIRUBIN URINE: NEGATIVE
Glucose, UA: NEGATIVE mg/dL
Hgb urine dipstick: NEGATIVE
KETONES UR: NEGATIVE mg/dL
Leukocytes, UA: NEGATIVE
Nitrite: NEGATIVE
PH: 5 (ref 5.0–8.0)
PROTEIN: NEGATIVE mg/dL
SQUAMOUS EPITHELIAL / LPF: NONE SEEN (ref 0–5)
Specific Gravity, Urine: 1.014 (ref 1.005–1.030)

## 2017-09-04 LAB — CBC
HCT: 38 % — ABNORMAL LOW (ref 40.0–52.0)
Hemoglobin: 12.9 g/dL — ABNORMAL LOW (ref 13.0–18.0)
MCH: 31.2 pg (ref 26.0–34.0)
MCHC: 33.9 g/dL (ref 32.0–36.0)
MCV: 92.2 fL (ref 80.0–100.0)
PLATELETS: 172 10*3/uL (ref 150–440)
RBC: 4.12 MIL/uL — AB (ref 4.40–5.90)
RDW: 14.4 % (ref 11.5–14.5)
WBC: 8 10*3/uL (ref 3.8–10.6)

## 2017-09-04 NOTE — ED Triage Notes (Signed)
Pt presents to ED via POV c/o urinary retention x3 days. Pt states he can pee a little but it's just dribbles. Denies pain. Hx prostatitis.

## 2017-09-04 NOTE — ED Provider Notes (Signed)
[facility] Emergency Department Provider Note   ____________________________________________    I have reviewed the triage vital signs and the nursing notes.   HISTORY  Chief Complaint Urinary Retention     HPI Albert Boone is a 84 y.o. male who presents with difficulty urinating.  Patient reports a history of prostatic hypertrophy.  Recently has had to decrease his terazosin dosing because of lightheadedness and dizziness.  Over the last 24 hours patient has had increasing difficulty urinating, he is only able to get out of few drops, he feels pressure in his bladder and is quite uncomfortable.  Denies fevers or chills.   Past Medical History:  Diagnosis Date  . Dementia   . GERD (gastroesophageal reflux disease)   . Hyperlipemia   . Hypertension   . Prostatitis   . Stroke [facility])    mini strokes    Patient Active Problem List   Diagnosis Date Noted  . A-fib ([place]) 05/26/2017  . Hyperlipidemia 05/26/2017  . CKD (chronic kidney disease) stage 3, GFR 30-59 ml/min (HCC) 05/26/2017  . BPH associated with nocturia 05/26/2017  . TIA (transient ischemic attack) 04/20/2017  . Seizure ([place]) 01/05/2016  . Sepsis ([place]) 08/21/2015    Past Surgical History:  Procedure Laterality Date  . SKIN CANCER EXCISION     multiple times    Prior to Admission medications   Medication Sig Start Date End Date Taking? Authorizing Provider  aspirin EC 81 MG tablet Take 81 mg by mouth daily.    [provider]  atorvastatin (LIPITOR) 10 MG tablet Take 10 mg by mouth daily.    [provider]  clopidogrel (PLAVIX) 75 MG tablet Take 75 mg by mouth daily.    [provider]  diltiazem (CARDIZEM CD) 120 MG 24 hr capsule Take 1 capsule (120 mg total) by mouth daily. 08/19/17 08/19/18  Merlyn Lot, MD  levETIRAcetam (KEPPRA) 500 MG tablet Take 1 tablet (500 mg total) by mouth 2 (two) times daily. Patient taking differently: Take  500 mg by mouth every evening.  01/06/16   Demetrios Loll, MD  loperamide (IMODIUM) 2 MG capsule Take 2 mg by mouth 4 (four) times daily as needed for diarrhea or loose stools.    [provider]  Multiple Vitamins-Minerals (MULTIVITAMIN WITH MINERALS) tablet Take 1 tablet by mouth daily.    [provider]  pantoprazole (PROTONIX) 40 MG tablet Take 40 mg by mouth daily.    [provider]  terazosin (HYTRIN) 5 MG capsule Take 1 capsule (5 mg total) by mouth 2 (two) times daily. Patient taking differently: Take 5 mg by mouth daily.  08/18/15   Bjorn Loser, MD     Allergies Penicillins  Family History  Problem Relation Age of Onset  . Hypertension Father   . Prostate cancer Neg Hx   . Hematuria Neg Hx   . Kidney disease Neg Hx     Social History Social History   Tobacco Use  . Smoking status: Former Smoker    Types: Cigarettes    Last attempt to quit: 08/18/1966    Years since quitting: 51.0  . Smokeless tobacco: Never Used  Substance Use Topics  . Alcohol use: Yes    Alcohol/week: 4.0 standard drinks    Types: 4 Glasses of wine per week  . Drug use: No    Review of Systems  Constitutional: No fever Eyes: No changes in vision ENT: No neck pain Cardiovascular: Denies chest pain.  Respiratory: No difficulty breathing Gastrointestinal:  No nausea, no vomiting.   Genitourinary: As above Musculoskeletal: Negative for back pain. Skin: Negative for rash. Neurological: Negative for headaches    ____________________________________________   PHYSICAL EXAM:  VITAL SIGNS: ED Triage Vitals  Enc Vitals Group     BP 09/04/17 1740 130/80     Pulse Rate 09/04/17 1740 76     Resp 09/04/17 1740 18     Temp 09/04/17 1740 98 F (36.7 C)     Temp Source 09/04/17 1740 Oral     SpO2 09/04/17 1740 95 %     Weight 09/04/17 1742 60.8 kg (134 lb)     Height 09/04/17 1742 1.727 m (5\' 8" )     Head Circumference --      Peak Flow --      Pain Score  09/04/17 1742 0     Pain Loc --      Pain Edu? --      Excl. in [place]? --     Constitutional: Alert and oriented.  Eyes: Conjunctivae are normal.  Nose: No congestion/rhinnorhea. Mouth/Throat: Mucous membranes are moist.   NCardiovascular: Normal rate, regular rhythm.  Good peripheral circulation. Respiratory: Normal respiratory effort.  No retractions.  Gastrointestinal: Soft and nontender. Mild suprapubic distention  Musculoskeletal: No lower extremity tenderness nor edema.  Warm and well perfused Neurologic:  Normal speech and language. No gross focal neurologic deficits are appreciated.  Skin:  Skin is warm, dry and intact. No rash noted. Psychiatric: Mood and affect are normal. Speech and behavior are normal.  ____________________________________________   LABS (all labs ordered are listed, but only abnormal results are displayed)  Labs Reviewed  URINALYSIS, COMPLETE (UACMP) WITH MICROSCOPIC - Abnormal; Notable for the following components:      Result Value   Color, Urine YELLOW (*)    APPearance CLEAR (*)    All other components within normal limits  BASIC METABOLIC PANEL - Abnormal; Notable for the following components:   Glucose, Bld 108 (*)    Creatinine, Ser 1.26 (*)    Calcium 8.6 (*)    GFR calc non Af Amer 47 (*)    GFR calc Af Amer 55 (*)    All other components within normal limits  CBC - Abnormal; Notable for the following components:   RBC 4.12 (*)    Hemoglobin 12.9 (*)    HCT 38.0 (*)    All other components within normal limits   ____________________________________________  EKG  None ____________________________________________  RADIOLOGY  Bladder scan demonstrates 700 cc ____________________________________________   PROCEDURES  Procedure(s) performed: No  Procedures   Critical Care performed: No ____________________________________________   INITIAL IMPRESSION / ASSESSMENT AND PLAN / ED COURSE  Pertinent labs & imaging results  that were available during my care of the patient were reviewed by me and considered in my medical decision making (see chart for details).  With 700 cc in the bladder, difficulty urinating likely related to BPH.  Foley inserted by RN.  Will send urinalysis, check BMP  Urinalysis BMP CBC unremarkable.  600 cc output.  Patient feels much better.  Follow-up with urology    ____________________________________________   FINAL CLINICAL IMPRESSION(S) / ED DIAGNOSES  Final diagnoses:  Urinary retention        Note:  This document was prepared using Dragon voice recognition software and may include unintentional dictation errors.    Lavonia Drafts, MD 09/04/17 [phone]

## 2017-09-09 ENCOUNTER — Other Ambulatory Visit: Payer: Self-pay

## 2017-09-09 ENCOUNTER — Emergency Department
Admission: EM | Admit: 2017-09-09 | Discharge: 2017-09-09 | Disposition: A | Discharge status: Home / Self Care | Payer: PPO | Attending: Emergency Medicine | Admitting: Emergency Medicine

## 2017-09-09 DIAGNOSIS — N183 Chronic kidney disease, stage 3 (moderate): Secondary | ICD-10-CM | POA: Insufficient documentation

## 2017-09-09 DIAGNOSIS — I129 Hypertensive chronic kidney disease with stage 1 through stage 4 chronic kidney disease, or unspecified chronic kidney disease: Secondary | ICD-10-CM | POA: Diagnosis not present

## 2017-09-09 DIAGNOSIS — N390 Urinary tract infection, site not specified: Secondary | ICD-10-CM | POA: Diagnosis not present

## 2017-09-09 DIAGNOSIS — E860 Dehydration: Secondary | ICD-10-CM | POA: Diagnosis not present

## 2017-09-09 DIAGNOSIS — Z7901 Long term (current) use of anticoagulants: Secondary | ICD-10-CM | POA: Insufficient documentation

## 2017-09-09 DIAGNOSIS — Z8673 Personal history of transient ischemic attack (TIA), and cerebral infarction without residual deficits: Secondary | ICD-10-CM | POA: Diagnosis not present

## 2017-09-09 DIAGNOSIS — Y658 Other specified misadventures during surgical and medical care: Secondary | ICD-10-CM | POA: Diagnosis not present

## 2017-09-09 DIAGNOSIS — M6281 Muscle weakness (generalized): Secondary | ICD-10-CM | POA: Diagnosis not present

## 2017-09-09 DIAGNOSIS — F0390 Unspecified dementia without behavioral disturbance: Secondary | ICD-10-CM | POA: Diagnosis not present

## 2017-09-09 DIAGNOSIS — N3090 Cystitis, unspecified without hematuria: Secondary | ICD-10-CM | POA: Diagnosis not present

## 2017-09-09 DIAGNOSIS — T83511A Infection and inflammatory reaction due to indwelling urethral catheter, initial encounter: Secondary | ICD-10-CM | POA: Insufficient documentation

## 2017-09-09 DIAGNOSIS — R531 Weakness: Secondary | ICD-10-CM | POA: Diagnosis not present

## 2017-09-09 LAB — URINALYSIS, COMPLETE (UACMP) WITH MICROSCOPIC
Bilirubin Urine: NEGATIVE
Glucose, UA: NEGATIVE mg/dL
Ketones, ur: NEGATIVE mg/dL
Nitrite: POSITIVE — AB
PROTEIN: 30 mg/dL — AB
RBC / HPF: >50 RBC/hpf — ABNORMAL HIGH (ref 0–5)
SPECIFIC GRAVITY, URINE: 1.019 (ref 1.005–1.030)
pH: 5 (ref 5.0–8.0)

## 2017-09-09 LAB — CBC
HEMATOCRIT: 39.6 % — AB (ref 40.0–52.0)
Hemoglobin: 13.4 g/dL (ref 13.0–18.0)
MCH: 30.9 pg (ref 26.0–34.0)
MCHC: 33.9 g/dL (ref 32.0–36.0)
MCV: 91.2 fL (ref 80.0–100.0)
Platelets: 186 10*3/uL (ref 150–440)
RBC: 4.34 MIL/uL — AB (ref 4.40–5.90)
RDW: 14.1 % (ref 11.5–14.5)
WBC: 6.7 10*3/uL (ref 3.8–10.6)

## 2017-09-09 LAB — BASIC METABOLIC PANEL
Anion gap: 5 (ref 5–15)
BUN: 20 mg/dL (ref 8–23)
CALCIUM: 8.6 mg/dL — AB (ref 8.9–10.3)
CHLORIDE: 107 mmol/L (ref 98–111)
CO2: 25 mmol/L (ref 22–32)
CREATININE: 1.17 mg/dL (ref 0.61–1.24)
GFR calc non Af Amer: 52 mL/min — ABNORMAL LOW (ref >60)
GLUCOSE: 118 mg/dL — AB (ref 70–99)
Potassium: 4.2 mmol/L (ref 3.5–5.1)
Sodium: 137 mmol/L (ref 135–145)

## 2017-09-09 MED ORDER — ONDANSETRON 4 MG PO TBDP: 4.0000 mg | ORAL_TABLET | Freq: Three times a day (TID) | ORAL | 0 refills | Status: DC | PRN

## 2017-09-09 MED ORDER — SODIUM CHLORIDE 0.9 % IV SOLN
1.0000 g | Freq: Once | INTRAVENOUS | Status: AC
  Administered 2017-09-09: 1 g via INTRAVENOUS
  Filled 2017-09-09: qty 10

## 2017-09-09 MED ORDER — CEFDINIR 300 MG PO CAPS: 300.0000 mg | ORAL_CAPSULE | Freq: Two times a day (BID) | ORAL | 0 refills | Status: DC

## 2017-09-09 MED ORDER — SODIUM CHLORIDE 0.9 % IV BOLUS
1000.0000 mL | Freq: Once | INTRAVENOUS | Status: AC
  Administered 2017-09-09: 1000 mL via INTRAVENOUS

## 2017-09-09 NOTE — Discharge Instructions (Signed)
Continue antibiotics as prescribed for the bladder infection and follow up with urology for further management of your urinary catheter.

## 2017-09-09 NOTE — ED Triage Notes (Signed)
Pt arrives from home via [facility] c/o increasing weakness/dizziness x 3-4 days. Pt arrives with foley cath and states he cannot remember why it is there. Pt is oriented x 4. Protocol initiated.

## 2017-09-09 NOTE — ED Provider Notes (Signed)
[facility] Emergency Department Provider Note  ____________________________________________  Time seen: Approximately 7:26 PM  I have reviewed the triage vital signs and the nursing notes.   HISTORY  Chief Complaint Weakness    HPI IRMA DELANCEY III is a 84 y.o. male with a history of prostatitis hyperlipidemia hypertension and dementia who comes to the ED due to decreased appetite and increasing weakness and dizziness on standing for the past 3 days.  He has a Foley catheter in place.  Symptoms are intermittent, worse with standing, better lying down.  Denies pain.  Mild to moderate intensity.      Past Medical History:  Diagnosis Date  . Dementia   . GERD (gastroesophageal reflux disease)   . Hyperlipemia   . Hypertension   . Prostatitis   . Stroke [facility])    mini strokes     Patient Active Problem List   Diagnosis Date Noted  . A-fib ([place]) 05/26/2017  . Hyperlipidemia 05/26/2017  . CKD (chronic kidney disease) stage 3, GFR 30-59 ml/min (HCC) 05/26/2017  . BPH associated with nocturia 05/26/2017  . TIA (transient ischemic attack) 04/20/2017  . Seizure ([place]) 01/05/2016  . Sepsis ([place]) 08/21/2015     Past Surgical History:  Procedure Laterality Date  . SKIN CANCER EXCISION     multiple times     Prior to Admission medications   Medication Sig Start Date End Date Taking? Authorizing Provider  aspirin EC 81 MG tablet Take 81 mg by mouth daily.   Yes [provider]  atorvastatin (LIPITOR) 10 MG tablet Take 10 mg by mouth daily.   Yes [provider]  clopidogrel (PLAVIX) 75 MG tablet Take 75 mg by mouth daily.   Yes [provider]  diltiazem (CARDIZEM CD) 120 MG 24 hr capsule Take 1 capsule (120 mg total) by mouth daily. 08/19/17 08/19/18 Yes Merlyn Lot, MD  Multiple Vitamins-Minerals (MULTIVITAMIN WITH MINERALS) tablet Take 1 tablet by mouth daily.   Yes [provider]  pantoprazole  (PROTONIX) 40 MG tablet Take 40 mg by mouth daily.   Yes [provider]  terazosin (HYTRIN) 2 MG capsule Take 2 mg by mouth at bedtime. 09/02/17  Yes [provider]  cefdinir (OMNICEF) 300 MG capsule Take 1 capsule (300 mg total) by mouth 2 (two) times daily. 09/09/17   Carrie Mew, MD  levETIRAcetam (KEPPRA) 500 MG tablet Take 1 tablet (500 mg total) by mouth 2 (two) times daily. Patient not taking: Reported on 09/09/2017 01/06/16   Demetrios Loll, MD  loperamide (IMODIUM) 2 MG capsule Take 2 mg by mouth 4 (four) times daily as needed for diarrhea or loose stools.    [provider]  ondansetron (ZOFRAN ODT) 4 MG disintegrating tablet Take 1 tablet (4 mg total) by mouth every 8 (eight) hours as needed for nausea or vomiting. 09/09/17   Carrie Mew, MD     Allergies Penicillins   Family History  Problem Relation Age of Onset  . Hypertension Father   . Prostate cancer Neg Hx   . Hematuria Neg Hx   . Kidney disease Neg Hx     Social History Social History   Tobacco Use  . Smoking status: Former Smoker    Types: Cigarettes    Last attempt to quit: 08/18/1966    Years since quitting: 51.0  . Smokeless tobacco: Never Used  Substance Use Topics  . Alcohol use: Yes    Alcohol/week: 4.0 standard drinks    Types:  4 Glasses of wine per week  . Drug use: No    Review of Systems  Constitutional:   No fever or chills.  Cardiovascular:   No chest pain or syncope. Respiratory:   No dyspnea or cough. Gastrointestinal:   Negative for abdominal pain, vomiting and diarrhea.  Musculoskeletal:   Negative for focal pain or swelling All other systems reviewed and are negative except as documented above in ROS and HPI.  ____________________________________________   PHYSICAL EXAM:  VITAL SIGNS: ED Triage Vitals  Enc Vitals Group     BP 09/09/17 1506 124/81     Pulse Rate 09/09/17 1515 80     Resp 09/09/17 1515 (!) 28     Temp 09/09/17 1522 98.8 F  (37.1 C)     Temp Source 09/09/17 1522 Oral     SpO2 09/09/17 1515 97 %     Weight 09/09/17 1523 149 lb 14.6 oz (68 kg)     Height 09/09/17 1523 5\' 8"  (1.727 m)     Head Circumference --      Peak Flow --      Pain Score 09/09/17 1522 0     Pain Loc --      Pain Edu? --      Excl. in [place]? --     Vital signs reviewed, nursing assessments reviewed.   Constitutional:   Alert and oriented. Non-toxic appearance. Eyes:   Conjunctivae are normal. EOMI. PERRL. ENT      Head:   Normocephalic and atraumatic.      Nose:   No congestion/rhinnorhea.       Mouth/Throat:   Dry mucous membranes, no pharyngeal erythema. No peritonsillar mass.       Neck:   No meningismus. Full ROM. Hematological/Lymphatic/Immunilogical:   No cervical lymphadenopathy. Cardiovascular:   RRR. Symmetric bilateral radial and DP pulses.  No murmurs. Cap refill less than 2 seconds. Respiratory:   Normal respiratory effort without tachypnea/retractions. Breath sounds are clear and equal bilaterally. No wheezes/rales/rhonchi. Gastrointestinal:   Soft and nontender. Non distended. There is no CVA tenderness.  No rebound, rigidity, or guarding. Genitourinary:   Foley catheter in place Musculoskeletal:   Normal range of motion in all extremities. No joint effusions.  No lower extremity tenderness.  No edema. Neurologic:   Normal speech and language.  Motor grossly intact. No acute focal neurologic deficits are appreciated.  Skin:    Skin is warm, dry and intact. No rash noted.  No petechiae, purpura, or bullae.  ____________________________________________    LABS (pertinent positives/negatives) (all labs ordered are listed, but only abnormal results are displayed) Labs Reviewed  BASIC METABOLIC PANEL - Abnormal; Notable for the following components:      Result Value   Glucose, Bld 118 (*)    Calcium 8.6 (*)    GFR calc non Af Amer 52 (*)    All other components within normal limits  CBC - Abnormal; Notable for  the following components:   RBC 4.34 (*)    HCT 39.6 (*)    All other components within normal limits  URINALYSIS, COMPLETE (UACMP) WITH MICROSCOPIC - Abnormal; Notable for the following components:   Color, Urine YELLOW (*)    APPearance HAZY (*)    Hgb urine dipstick LARGE (*)    Protein, ur 30 (*)    Nitrite POSITIVE (*)    Leukocytes, UA TRACE (*)    RBC / HPF >50 (*)    Bacteria, UA FEW (*)  All other components within normal limits  URINE CULTURE   ____________________________________________   EKG  Interpreted by me Sinus rhythm rate of 82, normal axis and intervals.  Right bundle branch block.  No acute ischemic changes.  ____________________________________________    RADIOLOGY  No results found.  ____________________________________________   PROCEDURES Procedures  ____________________________________________    CLINICAL IMPRESSION / ASSESSMENT AND PLAN / ED COURSE  Pertinent labs & imaging results that were available during my care of the patient were reviewed by me and considered in my medical decision making (see chart for details).    Patient presents with weakness and orthostatic symptoms consistent with dehydration.  Also found on lab work-up to have a urinary tract infection associated with his indwelling Foley catheter.  Due to age and catheter use, I given him IV ceftriaxone and will continue him on Omnicef and have him follow-up with primary care.  Given IV fluids for hydration.  He is feeling better and agreeable to discharge home and outpatient management.  No evidence of sepsis or other acute intra-abdominal pathology.  White count is normal.  Urine culture sent.  Vital signs unremarkable.      ____________________________________________   FINAL CLINICAL IMPRESSION(S) / ED DIAGNOSES    Final diagnoses:  Dehydration  Cystitis  Urinary tract infection associated with indwelling urethral catheter, initial encounter [facility])     ED  Discharge Orders         Ordered    cefdinir (OMNICEF) 300 MG capsule  2 times daily     09/09/17 1925    ondansetron (ZOFRAN ODT) 4 MG disintegrating tablet  Every 8 hours PRN     09/09/17 1926          Portions of this note were generated with dragon dictation software. Dictation errors may occur despite best attempts at proofreading.    Carrie Mew, MD 09/09/17 Kathyrn Drown

## 2017-09-11 LAB — URINE CULTURE: Culture: >=100000 — AB

## 2017-09-16 ENCOUNTER — Ambulatory Visit (INDEPENDENT_AMBULATORY_CARE_PROVIDER_SITE_OTHER): Payer: PPO | Admitting: Urology

## 2017-09-16 ENCOUNTER — Encounter: Payer: Self-pay | Admitting: Urology

## 2017-09-16 VITALS — BP 142/78 | HR 80 | Wt 129.1 lb

## 2017-09-16 DIAGNOSIS — R339 Retention of urine, unspecified: Secondary | ICD-10-CM

## 2017-09-16 MED ORDER — SILODOSIN 4 MG PO CAPS: 4.0000 mg | ORAL_CAPSULE | Freq: Every day | ORAL | 2 refills | Status: DC

## 2017-09-16 NOTE — Progress Notes (Signed)
09/16/2017 8:36 AM   Albert Boone Oct 14, 1922 [id number]  Referring provider: Rusty Aus, MD [place] [facility] [place], [place] [address]  Chief Complaint  Patient presents with  . Hospitalization Follow-up    HPI: 84 year old male who has previously seen Dr. Matilde Sprang for BPH.  He has been on terazosin for years at a low dose of 2 mg.  He was seen in 2014 and increased to 5 mg which was subsequently titrated to 10 mg.  He did develop hypotension and orthostatic symptoms on 10 mg and subsequently 5 mg and went back down to 2 mg.  He presented to the ED on 09/04/2017 with acute urinary retention.  A Foley catheter was placed with return of 600 mL of clear urine.  He presents today for catheter removal.  He has no complaints other than catheter irritation.  He went back to the ED on 8/30 with dehydration and was started on Omnicef after his urine grew Citrobacter.  He is remains on this medication.    PMH: Past Medical History:  Diagnosis Date  . Dementia   . GERD (gastroesophageal reflux disease)   . Hyperlipemia   . Hypertension   . Prostatitis   . Stroke [facility])    mini strokes    Surgical History: Past Surgical History:  Procedure Laterality Date  . SKIN CANCER EXCISION     multiple times    Home Medications:  Allergies as of 09/16/2017      Reactions   Ace Inhibitors Other (See Comments)   Renal failure   Nsaids Other (See Comments)   Renal failure   Clopidogrel Diarrhea   Penicillins Rash   Has patient had a PCN reaction causing immediate rash, facial/tongue/throat swelling, SOB or lightheadedness with hypotension: Yes Has patient had a PCN reaction causing severe rash involving mucus membranes or skin necrosis: No Has patient had a PCN reaction that required hospitalization No Has patient had a PCN reaction occurring within the last 10 years: No If all of the above answers are "NO", then may proceed with  Cephalosporin use.   Tramadol Rash   With itching      Medication List        Accurate as of 09/16/17  8:36 AM. Always use your most recent med list.          apixaban 2.5 MG Tabs tablet Commonly known as:  ELIQUIS Take by mouth.   aspirin EC 81 MG tablet Take 81 mg by mouth daily.   atorvastatin 10 MG tablet Commonly known as:  LIPITOR Take 10 mg by mouth daily.   cefdinir 300 MG capsule Commonly known as:  OMNICEF Take 1 capsule (300 mg total) by mouth 2 (two) times daily.   clopidogrel 75 MG tablet Commonly known as:  PLAVIX Take 75 mg by mouth daily.   diltiazem 120 MG 24 hr capsule Commonly known as:  CARDIZEM CD Take 1 capsule (120 mg total) by mouth daily.   levETIRAcetam 500 MG tablet Commonly known as:  KEPPRA Take 1 tablet (500 mg total) by mouth 2 (two) times daily.   loperamide 2 MG capsule Commonly known as:  IMODIUM Take 2 mg by mouth 4 (four) times daily as needed for diarrhea or loose stools.   megestrol 40 MG tablet Commonly known as:  MEGACE TAKE ONE TABLET BY MOUTH EVERY DAY   multivitamin with minerals tablet Take 1 tablet by mouth daily.   ondansetron 4 MG disintegrating  tablet Commonly known as:  ZOFRAN-ODT Take 1 tablet (4 mg total) by mouth every 8 (eight) hours as needed for nausea or vomiting.   pantoprazole 40 MG tablet Commonly known as:  PROTONIX Take 40 mg by mouth daily.   terazosin 2 MG capsule Commonly known as:  HYTRIN Take 2 mg by mouth at bedtime.       Allergies:  Allergies  Allergen Reactions  . Ace Inhibitors Other (See Comments)    Renal failure  . Nsaids Other (See Comments)    Renal failure  . Clopidogrel Diarrhea  . Penicillins Rash    Has patient had a PCN reaction causing immediate rash, facial/tongue/throat swelling, SOB or lightheadedness with hypotension: Yes Has patient had a PCN reaction causing severe rash involving mucus membranes or skin necrosis: No Has patient had a PCN reaction that  required hospitalization No Has patient had a PCN reaction occurring within the last 10 years: No If all of the above answers are "NO", then may proceed with Cephalosporin use.   . Tramadol Rash    With itching    Family History: Family History  Problem Relation Age of Onset  . Hypertension Father   . Prostate cancer Neg Hx   . Hematuria Neg Hx   . Kidney disease Neg Hx     Social History:  reports that he quit smoking about 51 years ago. His smoking use included cigarettes. He has never used smokeless tobacco. He reports that he drinks about 4.0 standard drinks of alcohol per week. He reports that he does not use drugs.  ROS: UROLOGY Frequent Urination?: No Hard to postpone urination?: No Burning/pain with urination?: No Get up at night to urinate?: Yes Leakage of urine?: No Urine stream starts and stops?: No Trouble starting stream?: Yes Do you have to strain to urinate?: Yes Blood in urine?: No Urinary tract infection?: Yes Sexually transmitted disease?: No Injury to kidneys or bladder?: No Painful intercourse?: No Weak stream?: No Erection problems?: No Penile pain?: No  Gastrointestinal Nausea?: No Vomiting?: No Indigestion/heartburn?: Yes Diarrhea?: Yes Constipation?: Yes  Constitutional Fever: No Night sweats?: No Weight loss?: Yes Fatigue?: Yes  Skin Skin rash/lesions?: No Itching?: No  Eyes Blurred vision?: Yes Double vision?: No  Ears/Nose/Throat Sore throat?: No Sinus problems?: No  Hematologic/Lymphatic Swollen glands?: No Easy bruising?: Yes  Cardiovascular Leg swelling?: No Chest pain?: No  Respiratory Cough?: No Shortness of breath?: Yes  Endocrine Excessive thirst?: No  Musculoskeletal Back pain?: Yes Joint pain?: No  Neurological Headaches?: No Dizziness?: Yes  Psychologic Depression?: No Anxiety?: No  Physical Exam: BP (!) 142/78 (BP Location: Left Arm, Patient Position: Sitting, Cuff Size: Normal)   Pulse  80   Wt 129 lb 1.6 oz (58.6 kg)   BMI 19.63 kg/m   Constitutional:  Alert and oriented, No acute distress. HEENT: [place] AT, moist mucus membranes.  Trachea midline, no masses. Cardiovascular: No clubbing, cyanosis, or edema. Respiratory: Normal respiratory effort, no increased work of breathing. GI: Abdomen is soft, nontender, nondistended, no abdominal masses GU: No CVA tenderness Lymph: No cervical or inguinal lymphadenopathy. Skin: No rashes, bruises or suspicious lesions. Neurologic: Grossly intact, no focal deficits, moving all 4 extremities. Psychiatric: Normal mood and affect.   Assessment & Plan:   84 year old male with BPH and recent episode of urinary retention.  He is only very low dose of an alpha-blocker and has not been able to tolerate higher doses of terazosin recently.  He will discontinue this medication and Rx  silodosin was sent to his pharmacy.  If this is going to be cost prohibitive will start tamsulosin.  He will return later today if unable to void.  He will otherwise follow-up in approximately 4 to 6 weeks for symptom recheck and bladder scan for PVR.   Abbie Sons, [place] [address] [place], [place] [address] [phone]

## 2017-09-18 ENCOUNTER — Encounter: Payer: Self-pay | Admitting: Urology

## 2017-09-18 DIAGNOSIS — R339 Retention of urine, unspecified: Secondary | ICD-10-CM | POA: Insufficient documentation

## 2017-09-20 DIAGNOSIS — I482 Chronic atrial fibrillation: Secondary | ICD-10-CM | POA: Diagnosis not present

## 2017-09-20 DIAGNOSIS — Z79899 Other long term (current) drug therapy: Secondary | ICD-10-CM | POA: Diagnosis not present

## 2017-09-20 DIAGNOSIS — N401 Enlarged prostate with lower urinary tract symptoms: Secondary | ICD-10-CM | POA: Diagnosis not present

## 2017-09-20 DIAGNOSIS — Z111 Encounter for screening for respiratory tuberculosis: Secondary | ICD-10-CM | POA: Diagnosis not present

## 2017-09-20 DIAGNOSIS — R1310 Dysphagia, unspecified: Secondary | ICD-10-CM | POA: Diagnosis not present

## 2017-09-20 DIAGNOSIS — N138 Other obstructive and reflux uropathy: Secondary | ICD-10-CM | POA: Diagnosis not present

## 2017-09-20 DIAGNOSIS — N3090 Cystitis, unspecified without hematuria: Secondary | ICD-10-CM | POA: Diagnosis not present

## 2017-09-25 ENCOUNTER — Emergency Department
Admission: EM | Admit: 2017-09-25 | Discharge: 2017-09-25 | Disposition: A | Discharge status: Home / Self Care | Payer: PPO | Source: Home / Self Care | Attending: Emergency Medicine | Admitting: Emergency Medicine

## 2017-09-25 ENCOUNTER — Emergency Department: Payer: PPO

## 2017-09-25 ENCOUNTER — Encounter: Payer: Self-pay | Admitting: Emergency Medicine

## 2017-09-25 ENCOUNTER — Other Ambulatory Visit: Payer: Self-pay

## 2017-09-25 DIAGNOSIS — R0989 Other specified symptoms and signs involving the circulatory and respiratory systems: Secondary | ICD-10-CM | POA: Diagnosis not present

## 2017-09-25 DIAGNOSIS — Z8679 Personal history of other diseases of the circulatory system: Secondary | ICD-10-CM | POA: Diagnosis not present

## 2017-09-25 DIAGNOSIS — I129 Hypertensive chronic kidney disease with stage 1 through stage 4 chronic kidney disease, or unspecified chronic kidney disease: Secondary | ICD-10-CM | POA: Insufficient documentation

## 2017-09-25 DIAGNOSIS — Z7982 Long term (current) use of aspirin: Secondary | ICD-10-CM | POA: Insufficient documentation

## 2017-09-25 DIAGNOSIS — Z79899 Other long term (current) drug therapy: Secondary | ICD-10-CM

## 2017-09-25 DIAGNOSIS — H3530 Unspecified macular degeneration: Secondary | ICD-10-CM | POA: Diagnosis not present

## 2017-09-25 DIAGNOSIS — F0390 Unspecified dementia without behavioral disturbance: Secondary | ICD-10-CM | POA: Diagnosis present

## 2017-09-25 DIAGNOSIS — Z23 Encounter for immunization: Secondary | ICD-10-CM

## 2017-09-25 DIAGNOSIS — Z79818 Long term (current) use of other agents affecting estrogen receptors and estrogen levels: Secondary | ICD-10-CM | POA: Diagnosis not present

## 2017-09-25 DIAGNOSIS — N400 Enlarged prostate without lower urinary tract symptoms: Secondary | ICD-10-CM | POA: Diagnosis present

## 2017-09-25 DIAGNOSIS — Z886 Allergy status to analgesic agent status: Secondary | ICD-10-CM

## 2017-09-25 DIAGNOSIS — Z880 Allergy status to penicillin: Secondary | ICD-10-CM

## 2017-09-25 DIAGNOSIS — R0600 Dyspnea, unspecified: Secondary | ICD-10-CM | POA: Diagnosis not present

## 2017-09-25 DIAGNOSIS — Z85828 Personal history of other malignant neoplasm of skin: Secondary | ICD-10-CM

## 2017-09-25 DIAGNOSIS — Z8673 Personal history of transient ischemic attack (TIA), and cerebral infarction without residual deficits: Secondary | ICD-10-CM | POA: Diagnosis not present

## 2017-09-25 DIAGNOSIS — Z885 Allergy status to narcotic agent status: Secondary | ICD-10-CM

## 2017-09-25 DIAGNOSIS — R1310 Dysphagia, unspecified: Principal | ICD-10-CM | POA: Diagnosis present

## 2017-09-25 DIAGNOSIS — N183 Chronic kidney disease, stage 3 (moderate): Secondary | ICD-10-CM | POA: Insufficient documentation

## 2017-09-25 DIAGNOSIS — Z7901 Long term (current) use of anticoagulants: Secondary | ICD-10-CM

## 2017-09-25 DIAGNOSIS — R4701 Aphasia: Secondary | ICD-10-CM | POA: Diagnosis present

## 2017-09-25 DIAGNOSIS — I4891 Unspecified atrial fibrillation: Secondary | ICD-10-CM | POA: Diagnosis not present

## 2017-09-25 DIAGNOSIS — E785 Hyperlipidemia, unspecified: Secondary | ICD-10-CM | POA: Diagnosis not present

## 2017-09-25 DIAGNOSIS — Z888 Allergy status to other drugs, medicaments and biological substances status: Secondary | ICD-10-CM | POA: Diagnosis not present

## 2017-09-25 DIAGNOSIS — K219 Gastro-esophageal reflux disease without esophagitis: Secondary | ICD-10-CM | POA: Diagnosis not present

## 2017-09-25 DIAGNOSIS — Z87891 Personal history of nicotine dependence: Secondary | ICD-10-CM | POA: Insufficient documentation

## 2017-09-25 DIAGNOSIS — J849 Interstitial pulmonary disease, unspecified: Secondary | ICD-10-CM | POA: Diagnosis not present

## 2017-09-25 DIAGNOSIS — I639 Cerebral infarction, unspecified: Secondary | ICD-10-CM | POA: Diagnosis not present

## 2017-09-25 DIAGNOSIS — Z8249 Family history of ischemic heart disease and other diseases of the circulatory system: Secondary | ICD-10-CM

## 2017-09-25 DIAGNOSIS — I10 Essential (primary) hypertension: Secondary | ICD-10-CM | POA: Diagnosis not present

## 2017-09-25 HISTORY — DX: Transient cerebral ischemic attack, unspecified: G45.9

## 2017-09-25 HISTORY — DX: Unspecified atrial fibrillation: I48.91

## 2017-09-25 HISTORY — DX: Disorder of kidney and ureter, unspecified: N28.9

## 2017-09-25 LAB — COMPREHENSIVE METABOLIC PANEL
ALBUMIN: 3.8 g/dL (ref 3.5–5.0)
ALK PHOS: 45 U/L (ref 38–126)
ALT: 11 U/L (ref 0–44)
ANION GAP: 7 (ref 5–15)
AST: 16 U/L (ref 15–41)
BUN: 16 mg/dL (ref 8–23)
CALCIUM: 8.8 mg/dL — AB (ref 8.9–10.3)
CO2: 26 mmol/L (ref 22–32)
Chloride: 107 mmol/L (ref 98–111)
Creatinine, Ser: 1.09 mg/dL (ref 0.61–1.24)
GFR calc Af Amer: >60 mL/min (ref >60)
GFR, EST NON AFRICAN AMERICAN: 56 mL/min — AB (ref >60)
GLUCOSE: 101 mg/dL — AB (ref 70–99)
Potassium: 4 mmol/L (ref 3.5–5.1)
Sodium: 140 mmol/L (ref 135–145)
Total Bilirubin: 0.8 mg/dL (ref 0.3–1.2)
Total Protein: 7.2 g/dL (ref 6.5–8.1)

## 2017-09-25 LAB — CBC WITH DIFFERENTIAL/PLATELET
BASOS PCT: 1 %
Basophils Absolute: 0 10*3/uL (ref 0–0.1)
EOS PCT: 1 %
Eosinophils Absolute: 0.1 10*3/uL (ref 0–0.7)
HCT: 40 % (ref 40.0–52.0)
Hemoglobin: 13.6 g/dL (ref 13.0–18.0)
Lymphocytes Relative: 15 %
Lymphs Abs: 1.1 10*3/uL (ref 1.0–3.6)
MCH: 31.1 pg (ref 26.0–34.0)
MCHC: 33.9 g/dL (ref 32.0–36.0)
MCV: 91.5 fL (ref 80.0–100.0)
MONO ABS: 0.6 10*3/uL (ref 0.2–1.0)
Monocytes Relative: 8 %
Neutro Abs: 5.6 10*3/uL (ref 1.4–6.5)
Neutrophils Relative %: 75 %
PLATELETS: 191 10*3/uL (ref 150–440)
RBC: 4.37 MIL/uL — AB (ref 4.40–5.90)
RDW: 14 % (ref 11.5–14.5)
WBC: 7.3 10*3/uL (ref 3.8–10.6)

## 2017-09-25 LAB — TROPONIN I

## 2017-09-25 MED ORDER — SODIUM CHLORIDE 0.9 % IV BOLUS
500.0000 mL | Freq: Once | INTRAVENOUS | Status: AC
  Administered 2017-09-25: 500 mL via INTRAVENOUS

## 2017-09-25 NOTE — ED Triage Notes (Signed)
Pt arrived via EMS from home with reports of difficulty swallowing that started when he woke up around 0600.  Pt has hx CVA in February and has had some trouble swallowing since then, but reports worse today.  Pt was able to take 8 pills this morning with water, but reports he was not able to eat breakfast.   Pt is alert and oriented x 4.

## 2017-09-25 NOTE — ED Notes (Signed)
Pt states last night he was able to eat dinner last night without any problems. Pt reporting to EDP that he has been sick for the past month and reports weakness L>R which is normal for him.  Speech is clear, no facial droop, no arm or leg drift.  Pt able to swallow saliva.

## 2017-09-25 NOTE — Discharge Instructions (Addendum)
Follow-up with Dr. Sabra Heck.  Return to the ER immediately for new, worsening, persistent difficulty swallowing, inability to swallow your own saliva, or persistent coughing or shortness of breath, vomiting, fevers, or any other new or worsening symptoms that concern you.

## 2017-09-25 NOTE — ED Notes (Signed)
Pt given cup of water, pt able to take some sips of water via straw, but frequently coughing during assessment.  Family at bedside states the patient has been told in the past to tuck his chin to his chest to swallow.  Family also states that the patient's insurance does not allow home health speech and has not tried outpatient speech therapy yet.

## 2017-09-25 NOTE — ED Notes (Signed)
ED Provider at bedside. 

## 2017-09-25 NOTE — ED Provider Notes (Signed)
[facility] Emergency Department Provider Note ____________________________________________   First MD Initiated Contact with Patient 09/25/17 1124     (approximate)  I have reviewed the triage vital signs and the nursing notes.   HISTORY  Chief Complaint Dysphagia  Level 5 caveat: History of present illness limited due to dementia  HPI Albert Boone is a 84 y.o. male with PMH as noted below who presents with dysphagia, acute onset when he woke this morning, and associated with dry mouth and with discomfort in the back of his throat.  Patient states that he was able to swallow some pills earlier, and is able to swallow his saliva, but was not able to eat breakfast.  He denies chest pain or shortness of breath, headache, fever, or any weakness or numbness elsewhere in his body.  He does report generalized weakness over the last month.  Past Medical History:  Diagnosis Date  . A-fib ([place])   . Dementia   . GERD (gastroesophageal reflux disease)   . Hyperlipemia   . Hypertension   . Prostatitis   . Renal disorder   . Stroke [facility])    mini strokes  . TIA (transient ischemic attack)     Patient Active Problem List   Diagnosis Date Noted  . Urinary retention 09/18/2017  . A-fib ([place]) 05/26/2017  . Hyperlipidemia 05/26/2017  . CKD (chronic kidney disease) stage 3, GFR 30-59 ml/min (HCC) 05/26/2017  . BPH associated with nocturia 05/26/2017  . TIA (transient ischemic attack) 04/20/2017  . Seizure ([place]) 01/05/2016  . Sepsis ([place]) 08/21/2015    Past Surgical History:  Procedure Laterality Date  . SKIN CANCER EXCISION     multiple times    Prior to Admission medications   Medication Sig Start Date End Date Taking? Authorizing Provider  apixaban (ELIQUIS) 2.5 MG TABS tablet Take by mouth. 08/23/17   [provider]  aspirin EC 81 MG tablet Take 81 mg by mouth daily.    [provider]  atorvastatin (LIPITOR) 10 MG tablet  Take 10 mg by mouth daily.    [provider]  cefdinir (OMNICEF) 300 MG capsule Take 1 capsule (300 mg total) by mouth 2 (two) times daily. 09/09/17   Carrie Mew, MD  clopidogrel (PLAVIX) 75 MG tablet Take 75 mg by mouth daily.    [provider]  diltiazem (CARDIZEM CD) 120 MG 24 hr capsule Take 1 capsule (120 mg total) by mouth daily. 08/19/17 08/19/18  Merlyn Lot, MD  levETIRAcetam (KEPPRA) 500 MG tablet Take 1 tablet (500 mg total) by mouth 2 (two) times daily. 01/06/16   Demetrios Loll, MD  loperamide (IMODIUM) 2 MG capsule Take 2 mg by mouth 4 (four) times daily as needed for diarrhea or loose stools.    [provider]  megestrol (MEGACE) 40 MG tablet TAKE ONE TABLET BY MOUTH EVERY DAY 09/09/17   [provider]  Multiple Vitamins-Minerals (MULTIVITAMIN WITH MINERALS) tablet Take 1 tablet by mouth daily.    [provider]  ondansetron (ZOFRAN ODT) 4 MG disintegrating tablet Take 1 tablet (4 mg total) by mouth every 8 (eight) hours as needed for nausea or vomiting. 09/09/17   Carrie Mew, MD  pantoprazole (PROTONIX) 40 MG tablet Take 40 mg by mouth daily.    [provider]  silodosin (RAPAFLO) 4 MG CAPS capsule Take 1 capsule (4 mg total) by mouth daily with breakfast. 09/16/17   Stoioff, Ronda Fairly, MD    Allergies Ace  inhibitors; Nsaids; Clopidogrel; Penicillins; and Tramadol  Family History  Problem Relation Age of Onset  . Hypertension Father   . Prostate cancer Neg Hx   . Hematuria Neg Hx   . Kidney disease Neg Hx     Social History Social History   Tobacco Use  . Smoking status: Former Smoker    Types: Cigarettes    Last attempt to quit: 08/18/1966    Years since quitting: 51.1  . Smokeless tobacco: Never Used  Substance Use Topics  . Alcohol use: Yes    Alcohol/week: 4.0 standard drinks    Types: 4 Glasses of wine per week  . Drug use: No    Review of Systems Level 5 caveat: Review of systems limited due  to dementia Constitutional: No fever. ENT: Positive for dysphagia. Cardiovascular: Denies chest pain. Respiratory: Denies shortness of breath. Gastrointestinal: No vomiting. Neurological: Negative for headache.   ____________________________________________   PHYSICAL EXAM:  VITAL SIGNS: ED Triage Vitals  Enc Vitals Group     BP --      Pulse --      Resp --      Temp --      Temp src --      SpO2 --      Weight 09/25/17 1120 135 lb (61.2 kg)     Height 09/25/17 1120 5\' 8"  (1.727 m)     Head Circumference --      Peak Flow --      Pain Score 09/25/17 1119 0     Pain Loc --      Pain Edu? --      Excl. in [place]? --     Constitutional: Alert and oriented.  Somewhat frail and weak frail but not acutely ill-appearing.   Eyes: Conjunctivae are normal.  Head: Atraumatic. Nose: No congestion/rhinnorhea. Mouth/Throat: Mucous membranes are moist.  Oropharynx clear with no erythema, swelling, or exudate.  No stridor.  No pooling secretions. Neck: Normal range of motion.  No lymphadenopathy or swelling. Cardiovascular: Normal rate, regular rhythm. Grossly normal heart sounds.  Good peripheral circulation. Respiratory: Normal respiratory effort.  No retractions. Lungs CTAB. Gastrointestinal: Soft and nontender. No distention.  Genitourinary: No flank tenderness. Musculoskeletal: No lower extremity edema.  Extremities warm and well perfused.  Neurologic:  Normal speech and language.  No facial droop.  Motor and sensory at baseline in all extremities.  No gross focal neurologic deficits are appreciated.  Skin:  Skin is warm and dry. No rash noted. Psychiatric: Mood and affect are normal. Speech and behavior are normal.  ____________________________________________   LABS (all labs ordered are listed, but only abnormal results are displayed)  Labs Reviewed  COMPREHENSIVE METABOLIC PANEL - Abnormal; Notable for the following components:      Result Value   Glucose, Bld 101 (*)      Calcium 8.8 (*)    GFR calc non Af Amer 56 (*)    All other components within normal limits  CBC WITH DIFFERENTIAL/PLATELET - Abnormal; Notable for the following components:   RBC 4.37 (*)    All other components within normal limits  TROPONIN I  URINALYSIS, COMPLETE (UACMP) WITH MICROSCOPIC   ____________________________________________  EKG  ED ECG REPORT I, Arta Silence, the attending physician, personally viewed and interpreted this ECG.  Date: 09/25/2017 EKG Time: 1120 Rate: 100 Rhythm: normal sinus rhythm with frequent PVCs QRS Axis: normal Intervals: RBBB ST/T Wave abnormalities: normal Narrative Interpretation: no evidence of acute ischemia  ____________________________________________  RADIOLOGY  CT head: No ICH or acute stroke CXR: No focal infiltrate or other acute abnormalities ____________________________________________   PROCEDURES  Procedure(s) performed: No  Procedures  Critical Care performed: No ____________________________________________   INITIAL IMPRESSION / ASSESSMENT AND PLAN / ED COURSE  Pertinent labs & imaging results that were available during my care of the patient were reviewed by me and considered in my medical decision making (see chart for details).  84 year old male with PMH as noted above including dementia and CVA presents with dysphagia acute onset this morning associated with some discomfort in his throat.  No acute neurologic symptoms elsewhere in his body, and no chest pain or shortness of breath.  I reviewed the past medical records in Epic; the patient was seen in the ED in July of this year with a similar presentation with negative work-up at that time and spontaneously resolved symptoms.  Differential includes pharyngitis, dry mouth due to dehydration, GERD, esophageal dysmotility, or bulbar symptoms possibly related to acute on chronic CVA.  We will obtain labs, chest x-ray, CT head, and reassess.  If negative  work-up we will do p.o. trial and determine appropriate disposition.  ----------------------------------------- 1:33 PM on 09/25/2017 -----------------------------------------  The patient has remained stable in the ED.  He continues to have no difficulty tolerating his own secretions.  He did have some coughing when he tried to drink water however he was able to do it.  The patient's wife and daughter are now here and confirm that this has been a chronic problem.  He has been instructed to lean his head forward when he swallows which normally helps, but he states he forgot to do so this morning.  They state that he has no problems with solids, only liquids.  The lab work-up is unremarkable and there are no signs of significant dehydration.  CT head shows no acute findings and chest x-ray is clear.  There is no evidence of acute stroke and no evidence of aspiration or any airway compromise.   I counseled the patient and his family members on the results of the work-up.  This time there is no strict indication for admission given that he is tolerating p.o., although I let them know that if they felt he was not able to adequately tolerate p.o. at home we could observe him further.  If the patient wants to go home and the family members feel comfortable with this.  They would like to follow-up with his regular doctor.  Return precautions given, and they expressed understanding. ____________________________________________   FINAL CLINICAL IMPRESSION(S) / ED DIAGNOSES  Final diagnoses:  Dysphagia, unspecified type      NEW MEDICATIONS STARTED DURING THIS VISIT:  New Prescriptions   No medications on file     Note:  This document was prepared using Dragon voice recognition software and may include unintentional dictation errors.    Arta Silence, MD 09/25/17 1340

## 2017-09-26 ENCOUNTER — Inpatient Hospital Stay
Admission: EM | Admit: 2017-09-26 | Discharge: 2017-09-28 | DRG: 392 | Disposition: A | Discharge status: Home Health | Payer: PPO | Attending: Family Medicine | Admitting: Family Medicine

## 2017-09-26 ENCOUNTER — Inpatient Hospital Stay: Payer: PPO

## 2017-09-26 ENCOUNTER — Other Ambulatory Visit: Payer: Self-pay

## 2017-09-26 DIAGNOSIS — Z885 Allergy status to narcotic agent status: Secondary | ICD-10-CM | POA: Diagnosis not present

## 2017-09-26 DIAGNOSIS — E785 Hyperlipidemia, unspecified: Secondary | ICD-10-CM | POA: Diagnosis present

## 2017-09-26 DIAGNOSIS — R4701 Aphasia: Secondary | ICD-10-CM | POA: Diagnosis present

## 2017-09-26 DIAGNOSIS — Z886 Allergy status to analgesic agent status: Secondary | ICD-10-CM | POA: Diagnosis not present

## 2017-09-26 DIAGNOSIS — Z8673 Personal history of transient ischemic attack (TIA), and cerebral infarction without residual deficits: Secondary | ICD-10-CM | POA: Diagnosis not present

## 2017-09-26 DIAGNOSIS — Z888 Allergy status to other drugs, medicaments and biological substances status: Secondary | ICD-10-CM | POA: Diagnosis not present

## 2017-09-26 DIAGNOSIS — K219 Gastro-esophageal reflux disease without esophagitis: Secondary | ICD-10-CM | POA: Diagnosis present

## 2017-09-26 DIAGNOSIS — R1310 Dysphagia, unspecified: Secondary | ICD-10-CM

## 2017-09-26 DIAGNOSIS — H3530 Unspecified macular degeneration: Secondary | ICD-10-CM | POA: Diagnosis present

## 2017-09-26 DIAGNOSIS — Z880 Allergy status to penicillin: Secondary | ICD-10-CM | POA: Diagnosis not present

## 2017-09-26 DIAGNOSIS — N183 Chronic kidney disease, stage 3 (moderate): Secondary | ICD-10-CM | POA: Diagnosis present

## 2017-09-26 DIAGNOSIS — I129 Hypertensive chronic kidney disease with stage 1 through stage 4 chronic kidney disease, or unspecified chronic kidney disease: Secondary | ICD-10-CM | POA: Diagnosis present

## 2017-09-26 DIAGNOSIS — Z79899 Other long term (current) drug therapy: Secondary | ICD-10-CM | POA: Diagnosis not present

## 2017-09-26 DIAGNOSIS — Z79818 Long term (current) use of other agents affecting estrogen receptors and estrogen levels: Secondary | ICD-10-CM | POA: Diagnosis not present

## 2017-09-26 DIAGNOSIS — Z85828 Personal history of other malignant neoplasm of skin: Secondary | ICD-10-CM | POA: Diagnosis not present

## 2017-09-26 DIAGNOSIS — Z7901 Long term (current) use of anticoagulants: Secondary | ICD-10-CM | POA: Diagnosis not present

## 2017-09-26 DIAGNOSIS — N400 Enlarged prostate without lower urinary tract symptoms: Secondary | ICD-10-CM | POA: Diagnosis present

## 2017-09-26 DIAGNOSIS — F0390 Unspecified dementia without behavioral disturbance: Secondary | ICD-10-CM | POA: Diagnosis present

## 2017-09-26 DIAGNOSIS — I4891 Unspecified atrial fibrillation: Secondary | ICD-10-CM | POA: Diagnosis present

## 2017-09-26 DIAGNOSIS — Z8249 Family history of ischemic heart disease and other diseases of the circulatory system: Secondary | ICD-10-CM | POA: Diagnosis not present

## 2017-09-26 DIAGNOSIS — Z23 Encounter for immunization: Secondary | ICD-10-CM | POA: Diagnosis present

## 2017-09-26 DIAGNOSIS — Z87891 Personal history of nicotine dependence: Secondary | ICD-10-CM | POA: Diagnosis not present

## 2017-09-26 LAB — CBC
HEMATOCRIT: 41.1 % (ref 40.0–52.0)
HEMOGLOBIN: 13.9 g/dL (ref 13.0–18.0)
MCH: 30.9 pg (ref 26.0–34.0)
MCHC: 33.8 g/dL (ref 32.0–36.0)
MCV: 91.4 fL (ref 80.0–100.0)
PLATELETS: 195 10*3/uL (ref 150–440)
RBC: 4.49 MIL/uL (ref 4.40–5.90)
RDW: 14.2 % (ref 11.5–14.5)
WBC: 7.2 10*3/uL (ref 3.8–10.6)

## 2017-09-26 LAB — BASIC METABOLIC PANEL
Anion gap: 7 (ref 5–15)
BUN: 15 mg/dL (ref 8–23)
CHLORIDE: 108 mmol/L (ref 98–111)
CO2: 26 mmol/L (ref 22–32)
Calcium: 8.8 mg/dL — ABNORMAL LOW (ref 8.9–10.3)
Creatinine, Ser: 1.14 mg/dL (ref 0.61–1.24)
GFR calc Af Amer: >60 mL/min (ref >60)
GFR, EST NON AFRICAN AMERICAN: 53 mL/min — AB (ref >60)
GLUCOSE: 101 mg/dL — AB (ref 70–99)
POTASSIUM: 4.2 mmol/L (ref 3.5–5.1)
Sodium: 141 mmol/L (ref 135–145)

## 2017-09-26 MED ORDER — STROKE: EARLY STAGES OF RECOVERY BOOK
Freq: Once | Status: AC
  Administered 2017-09-26: 14:00:00

## 2017-09-26 MED ORDER — ATORVASTATIN CALCIUM 20 MG PO TABS
10.0000 mg | ORAL_TABLET | Freq: Every day | ORAL | Status: DC
  Administered 2017-09-26 – 2017-09-27 (×2): 10 mg via ORAL
  Filled 2017-09-26 (×2): qty 1

## 2017-09-26 MED ORDER — INFLUENZA VAC SPLIT HIGH-DOSE 0.5 ML IM SUSY
0.5000 mL | PREFILLED_SYRINGE | INTRAMUSCULAR | Status: AC
  Administered 2017-09-27: 12:00:00 0.5 mL via INTRAMUSCULAR
  Filled 2017-09-26 (×2): qty 0.5

## 2017-09-26 MED ORDER — IPRATROPIUM BROMIDE 0.06 % NA SOLN
2.0000 | Freq: Two times a day (BID) | NASAL | Status: DC
  Administered 2017-09-26 – 2017-09-28 (×5): 2 via NASAL
  Filled 2017-09-26: qty 15

## 2017-09-26 MED ORDER — FLUTICASONE PROPIONATE 50 MCG/ACT NA SUSP
2.0000 | Freq: Every day | NASAL | Status: DC
  Administered 2017-09-26 – 2017-09-27 (×2): 2 via NASAL
  Filled 2017-09-26: qty 16

## 2017-09-26 MED ORDER — FINASTERIDE 5 MG PO TABS
5.0000 mg | ORAL_TABLET | Freq: Every day | ORAL | Status: DC
  Administered 2017-09-28: 11:00:00 5 mg via ORAL
  Filled 2017-09-26: qty 1

## 2017-09-26 MED ORDER — CLOPIDOGREL BISULFATE 75 MG PO TABS
75.0000 mg | ORAL_TABLET | Freq: Every day | ORAL | Status: DC
  Administered 2017-09-27 – 2017-09-28 (×2): 75 mg via ORAL
  Filled 2017-09-26 (×3): qty 1

## 2017-09-26 MED ORDER — SODIUM CHLORIDE 0.9 % IV SOLN
INTRAVENOUS | Status: DC
  Administered 2017-09-26 – 2017-09-28 (×2): via INTRAVENOUS

## 2017-09-26 MED ORDER — PANTOPRAZOLE SODIUM 40 MG PO TBEC
40.0000 mg | DELAYED_RELEASE_TABLET | Freq: Every day | ORAL | Status: DC
  Administered 2017-09-28: 11:00:00 40 mg via ORAL
  Filled 2017-09-26: qty 1

## 2017-09-26 MED ORDER — ONDANSETRON 4 MG PO TBDP
4.0000 mg | ORAL_TABLET | Freq: Three times a day (TID) | ORAL | Status: DC | PRN
  Filled 2017-09-26: qty 1

## 2017-09-26 MED ORDER — SODIUM CHLORIDE 0.9 % IV BOLUS
1000.0000 mL | Freq: Once | INTRAVENOUS | Status: AC
  Administered 2017-09-26: 1000 mL via INTRAVENOUS

## 2017-09-26 MED ORDER — PNEUMOCOCCAL VAC POLYVALENT 25 MCG/0.5ML IJ INJ
0.5000 mL | INJECTION | INTRAMUSCULAR | Status: AC
  Administered 2017-09-27: 12:00:00 0.5 mL via INTRAMUSCULAR
  Filled 2017-09-26 (×2): qty 0.5

## 2017-09-26 MED ORDER — DILTIAZEM HCL ER COATED BEADS 120 MG PO CP24
120.0000 mg | ORAL_CAPSULE | Freq: Every day | ORAL | Status: DC
  Administered 2017-09-27 – 2017-09-28 (×2): 120 mg via ORAL
  Filled 2017-09-26 (×3): qty 1

## 2017-09-26 MED ORDER — APIXABAN 2.5 MG PO TABS
2.5000 mg | ORAL_TABLET | Freq: Two times a day (BID) | ORAL | Status: DC
  Administered 2017-09-26 – 2017-09-28 (×4): 2.5 mg via ORAL
  Filled 2017-09-26 (×4): qty 1

## 2017-09-26 MED ORDER — ADULT MULTIVITAMIN W/MINERALS CH
1.0000 | ORAL_TABLET | Freq: Every day | ORAL | Status: DC
  Administered 2017-09-28: 11:00:00 1 via ORAL
  Filled 2017-09-26: qty 1

## 2017-09-26 NOTE — Progress Notes (Signed)
Patient ID: Albert Boone, male   DOB: 07-17-1922, 84 y.o.   MRN: [id number]  ACP note  Patient and daughter at the bedside  Diagnosis: Difficulty with swallowing, accelerated hypertension, history of atrial fibrillation, BPH, chronic kidney disease, history of macular degeneration  CODE STATUS discussed.  Patient wishes to be a full code at this time.  Daughter states that in the past that he did have a DNR.  Patient wishes to be a full code at this time.  Plan.  Swallow evaluation with speech therapy.  Rule out stroke with MRI of the brain.  Increase Eliquis to twice a day dosing.  Continue to monitor closely.  If he is unable to swallow well then this could mean a difficult decision between hospice versus feeding tube.  Time spent on ACP discussion 20 minutes Dr. Loletha Grayer

## 2017-09-26 NOTE — H&P (Signed)
[place] [facility] [place] NAME: Albert Boone    MR#:  [id number]  DATE OF BIRTH:  12/19/1922  DATE OF ADMISSION:  09/26/2017  PRIMARY CARE PHYSICIAN: Rusty Aus, MD   REQUESTING/REFERRING PHYSICIAN: Dr. Aundria Rud  CHIEF COMPLAINT:   Chief Complaint  Patient presents with  . Aphasia    HISTORY OF PRESENT ILLNESS:  Albert Boone  is a 84 y.o. male with a known history of trouble swallowing.  He came to the ER yesterday and had trouble swallowing.  He was then able to swallow and was sent home.  Same thing happened this morning where he was unable to swallow very well.  He tried taking some [place] and actually vomited that up.  Hard for him to take pills.  He has been having lots of mucus and postnasal drip and runny nose.  Hospitalist services were contacted for further evaluation because of his trouble swallowing.  ER physician was concerned about stroke.  PAST MEDICAL HISTORY:   Past Medical History:  Diagnosis Date  . A-fib ([place])   . Dementia   . GERD (gastroesophageal reflux disease)   . Hyperlipemia   . Hypertension   . Prostatitis   . Renal disorder   . Stroke [facility])    mini strokes  . TIA (transient ischemic attack)     PAST SURGICAL HISTORY:   Past Surgical History:  Procedure Laterality Date  . SKIN CANCER EXCISION     multiple times    SOCIAL HISTORY:   Social History   Tobacco Use  . Smoking status: Former Smoker    Types: Cigarettes    Last attempt to quit: 08/18/1966    Years since quitting: 51.1  . Smokeless tobacco: Never Used  Substance Use Topics  . Alcohol use: Yes    Alcohol/week: 4.0 standard drinks    Types: 4 Glasses of wine per week    FAMILY HISTORY:   Family History  Problem Relation Age of Onset  . Hypertension Father   . Parkinson's disease Father   . Pleurisy Mother   . Prostate cancer Neg Hx   . Hematuria Neg Hx   . Kidney disease Neg Hx     DRUG  ALLERGIES:   Allergies  Allergen Reactions  . Ace Inhibitors Other (See Comments)    Renal failure  . Nsaids Other (See Comments)    Renal failure  . Clopidogrel Diarrhea  . Penicillins Rash    Has patient had a PCN reaction causing immediate rash, facial/tongue/throat swelling, SOB or lightheadedness with hypotension: Yes Has patient had a PCN reaction causing severe rash involving mucus membranes or skin necrosis: No Has patient had a PCN reaction that required hospitalization No Has patient had a PCN reaction occurring within the last 10 years: No If all of the above answers are "NO", then may proceed with Cephalosporin use.   . Tramadol Rash    With itching    REVIEW OF SYSTEMS:  CONSTITUTIONAL: No fever, chills or sweats.  Left leg weakness.Marland Kitchen  EYES: Wears glasses, blind right eye EARS, NOSE, AND THROAT: No tinnitus or ear pain.  Positive for runny nose and postnasal drip RESPIRATORY: Some cough, shortness of breath with exertion.  No wheezing or hemoptysis.  CARDIOVASCULAR: No chest pain, orthopnea, edema.  GASTROINTESTINAL: Positive for nausea, vomiting.  Alternating constipation and diarrhea.  No abdominal pain. No blood in bowel movements GENITOURINARY: No dysuria, hematuria.  ENDOCRINE: No polyuria, nocturia,  HEMATOLOGY: No  anemia, easy bruising or bleeding SKIN: No rash or lesion. MUSCULOSKELETAL: No joint pain or arthritis.  Some left leg pain NEUROLOGIC: No tingling, numbness, weakness.  Dizziness with standing PSYCHIATRY: As per family he has depression but patient denies this  MEDICATIONS AT HOME:   Prior to Admission medications   Medication Sig Start Date End Date Taking? Authorizing Provider  apixaban (ELIQUIS) 2.5 MG TABS tablet Take 2.5 mg by mouth daily.  08/23/17  Yes [provider]  atorvastatin (LIPITOR) 10 MG tablet Take 10 mg by mouth daily.   Yes [provider]  clopidogrel (PLAVIX) 75 MG tablet Take 75 mg by mouth daily.   Yes  [provider]  diltiazem (CARDIZEM CD) 120 MG 24 hr capsule Take 1 capsule (120 mg total) by mouth daily. 08/19/17 08/19/18 Yes Merlyn Lot, MD  megestrol (MEGACE) 40 MG tablet TAKE ONE TABLET BY MOUTH EVERY DAY 09/09/17  Yes [provider]  Multiple Vitamins-Minerals (MULTIVITAMIN WITH MINERALS) tablet Take 1 tablet by mouth daily.   Yes [provider]  pantoprazole (PROTONIX) 40 MG tablet Take 40 mg by mouth daily.   Yes [provider]  silodosin (RAPAFLO) 4 MG CAPS capsule Take 1 capsule (4 mg total) by mouth daily with breakfast. 09/16/17  Yes Stoioff, Ronda Fairly, MD  loperamide (IMODIUM) 2 MG capsule Take 2 mg by mouth 4 (four) times daily as needed for diarrhea or loose stools.    [provider]  ondansetron (ZOFRAN ODT) 4 MG disintegrating tablet Take 1 tablet (4 mg total) by mouth every 8 (eight) hours as needed for nausea or vomiting. 09/09/17   Carrie Mew, MD      VITAL SIGNS:  Blood pressure (!) 176/107, pulse 76, temperature (!) 97.4 F (36.3 C), resp. rate (!) 25, height 5\' 8"  (1.727 m), weight 61.2 kg, SpO2 98 %.  PHYSICAL EXAMINATION:  GENERAL:  84 y.o.-year-old patient lying in the bed with no acute distress.  EYES: Pupils equal, round, reactive to light and accommodation. No scleral icterus. Extraocular muscles intact.  HEENT: Head atraumatic, normocephalic. Oropharynx and nasopharynx clear.  NECK:  Supple, no jugular venous distention. No thyroid enlargement, no tenderness.  LUNGS: Normal breath sounds bilaterally, no wheezing, rales,rhonchi or crepitation. No use of accessory muscles of respiration.  CARDIOVASCULAR: S1, S2 irregular regular. No murmurs, rubs, or gallops.  ABDOMEN: Soft, nontender, nondistended. Bowel sounds present. No organomegaly or mass.  EXTREMITIES: No pedal edema, cyanosis, or clubbing.  NEUROLOGIC: Cranial nerves II through XII are intact. Muscle strength 5/5 in all extremities. Sensation intact.  Gait not checked.  PSYCHIATRIC: The patient is alert and oriented x 3.  SKIN: No rash, lesion, or ulcer.   LABORATORY PANEL:   CBC Recent Labs  Lab 09/26/17 1234  WBC 7.2  HGB 13.9  HCT 41.1  PLT 195   ------------------------------------------------------------------------------------------------------------------  Chemistries  Recent Labs  Lab 09/25/17 1134 09/26/17 1234  NA 140 141  K 4.0 4.2  CL 107 108  CO2 26 26  GLUCOSE 101* 101*  BUN 16 15  CREATININE 1.09 1.14  CALCIUM 8.8* 8.8*  AST 16  --   ALT 11  --   ALKPHOS 45  --   BILITOT 0.8  --    ------------------------------------------------------------------------------------------------------------------  Cardiac Enzymes Recent Labs  Lab 09/25/17 1134  TROPONINI <0.03   ------------------------------------------------------------------------------------------------------------------  RADIOLOGY:  Ct Head Wo Contrast  Result Date: 09/25/2017 CLINICAL DATA:  Pt arrived via EMS from home with reports of difficulty swallowing that  started when he woke up around 0600. Pt has hx CVA in February and has had some trouble swallowing since then, but reports worse today. EXAM: CT HEAD WITHOUT CONTRAST TECHNIQUE: Contiguous axial images were obtained from the base of the skull through the vertex without intravenous contrast. COMPARISON:  Head CT dated 07/31/2017. FINDINGS: Brain: Generalized age related parenchymal volume loss with commensurate dilatation of the ventricles and sulci. Mild chronic small vessel ischemic changes within the bilateral periventricular white matter regions. No mass, hemorrhage, edema or other evidence of acute parenchymal abnormality. No extra-axial hemorrhage. Vascular: Chronic calcified atherosclerotic changes of the large vessels at the skull base. No unexpected hyperdense vessel. Skull: Normal. Negative for fracture or focal lesion. Sinuses/Orbits: No acute finding. Other: None. IMPRESSION: 1.  No acute findings.  No intracranial mass, hemorrhage or edema. 2. Chronic small vessel ischemic changes within the white matter. Electronically Signed   By: Franki Cabot M.D.   On: 09/25/2017 11:56   Dg Chest Portable 1 View  Result Date: 09/25/2017 CLINICAL DATA:  Difficulty swallowing this morning. History of CVA in February. EXAM: PORTABLE CHEST 1 VIEW COMPARISON:  Chest x-rays dated 08/19/2017 and 01/05/2016. FINDINGS: Heart size and mediastinal contours are stable. Coarse lung markings bilaterally are unchanged and consistent with chronic interstitial lung disease/fibrosis. There is chronic blunting at the LEFT costophrenic angle, incompletely imaged. No new lung findings. No pleural effusion or pneumothorax seen. Chronic elevation of the RIGHT hemidiaphragm. No acute or suspicious osseous finding. IMPRESSION: 1. No active disease.  No evidence of pneumonia or pulmonary edema. 2. Chronic interstitial lung disease/fibrosis. Electronically Signed   By: Franki Cabot M.D.   On: 09/25/2017 12:15    EKG:   Normal sinus rhythm 90 bpm right bundle branch block  IMPRESSION AND PLAN:   1.  Difficulty with swallowing.  Patient has a history of strokes.  Patient taking Eliquis once a day and Plavix.  Will get stroke work-up including MRI of the brain, echocardiogram.  Patient recently had a carotid sonogram, so I will not repeat.  Increase Eliquis to twice a day dosing.  Trial of ipratropium nasal spray and Flonase nasal spray just in case this is postnasal drip because of his difficulty swallowing happens mostly in the morning.  Swallow evaluation.  Case discussed with patient and daughter at the bedside if he does not swallow then we have to make a difficult decision based on what to do next. 2.  Accelerated hypertension give her Cardizem CD now.  Would rather have blood pressure little bit higher at this point. 3.  History of atrial fibrillation on Eliquis and Cardizem 4.  BPH on Rapaflo 5.  Chronic  kidney disease stage III 6.  History of macular degeneration   All the records are reviewed and case discussed with ED provider. Management plans discussed with the patient, family and they are in agreement.  CODE STATUS: Full code  TOTAL TIME TAKING CARE OF THIS PATIENT: 50 minutes, including ACP time.    Loletha Grayer M.D on 09/26/2017 at 1:45 PM  Between 7am to 6pm - Pager - [phone]  After 6pm call admission pager [phone]  [facility] Office  [phone]  CC: Primary care physician; Rusty Aus, MD

## 2017-09-26 NOTE — Progress Notes (Signed)
PT Cancellation Note  Patient Details Name: GURSHAN SETTLEMIRE MRN: [id number] DOB: Aug 23, 1922   Cancelled Treatment:    Reason Eval/Treat Not Completed: Patient at procedure or test/unavailable(Consult received and chart reviewed.  RN currently at bedside for admission assessment.  Will re-attempt at later time/date as medically appropriate and available.)  Zyron Deeley H. Owens Shark, PT, DPT, NCS 09/26/17, 2:58 PM [phone]

## 2017-09-26 NOTE — ED Notes (Signed)
Pt undressed and ready for MRI scan

## 2017-09-26 NOTE — ED Notes (Signed)
RN Gerald Stabs informed of needing to come and get patient for transport to room 125

## 2017-09-26 NOTE — Progress Notes (Signed)
SLP Cancellation Note  Patient Details Name: Albert Boone MRN: [id number] DOB: 31-May-1922   Cancelled treatment:       Reason Eval/Treat Not Completed: Patient at procedure or test/unavailable(chart reviewed; consulted NSG. Pt w/ OT eval.). ST services will f/u in AM. Recommended frequent oral care for hygiene and comfort; stimulation of swallowing. Aspiration precautions. NSG agreed.    Orinda Kenner, MS, CCC-SLP Watson,Katherine 09/26/2017, 4:59 PM

## 2017-09-26 NOTE — ED Triage Notes (Signed)
Pt comes via [facility] from home with c/o aphasia. Pt states he was seen here for same yesterday and was given opition of discharge or admittance. Pt was discharged home. Pt states he took some tussin this am for his congestion and it got worse. Pt denies any pain, fever, chills, N/V. Pt is A&OX4.

## 2017-09-26 NOTE — Evaluation (Signed)
Occupational Therapy Evaluation Patient Details Name: Albert Boone MRN: [id number] DOB: Oct 13, 1922 Today's Date: 09/26/2017    History of Present Illness 84 y.o. male with a known history of trouble swallowing.  He came to the ER yesterday 09/25/17 and had trouble swallowing.  He was then able to swallow and was sent home.  Same thing happened this morning where he was unable to swallow very well.  He tried taking some [place] and actually vomited that up.  Hard for him to take pills. Pt has had a lot of mucus and postnasal drip and runny nose.  Hospitalist services were contacted for further evaluation because of his trouble swallowing.   Clinical Impression   Pt seen for OT evaluation this date. Prior to hospital admission, pt was living with his spouse in a 1 story home with ramped entrance, ambulating very short (room to room) household distances with a SPC and using a manual w/c for longer household distances. Pt generally able to dress himself and use the bathroom. PCA assists with bathing and shower transfers, housekeeping, meals, and med mgt (with dtr). Family/dtr provides transportation. Per dtr in room, pt and pt's spouse (with mild dementia) have 24/7 assist at home between the PCA and family. Currently pt demonstrates impairments in activity tolerance, balance, strength generally, cardiopulmonary status (HR up to 133 while standing EOB to use urinal and taking 3 side steps along bed), and difficulty swallowing (primary reason for admission). No coordination or sensory deficits appreciated during assessment. Pt has macular degeneration at baseline, no changes in vision per pt.   Pt currently requires CGA to min assist for bathing and dressing and toileting tasks at this time 2/2 noted impairments. Pt would benefit from skilled OT to address noted impairments and functional limitations (see below for any additional details) in order to maximize safety and independence while minimizing  falls risk and caregiver burden. Daughter notes that pt and spouse are supposed to move into [facility] this week. Upon hospital discharge, recommend pt discharge to ALF with [facility] services and 24/7 supervision/assist especially for OOB/mobility initially.      Follow Up Recommendations  Home health OT;Supervision/Assistance - 24 hour    Equipment Recommendations  Other (comment)(TBD)    Recommendations for Other Services       Precautions / Restrictions Precautions Precautions: Fall Restrictions Weight Bearing Restrictions: No      Mobility Bed Mobility Overal bed mobility: Needs Assistance Bed Mobility: Supine to Sit;Sit to Supine     Supine to sit: HOB elevated;Min assist Sit to supine: Min assist   General bed mobility comments: min assist for UB support to come sit EOB, min assist back to bed for BLE mgt  Transfers Overall transfer level: Needs assistance Equipment used: 1 person hand held assist Transfers: Sit to/from Stand Sit to Stand: Min guard         General transfer comment: additional time/effort    Balance Overall balance assessment: History of Falls;Needs assistance Sitting-balance support: Feet supported;No upper extremity supported Sitting balance-Leahy Scale: Fair Sitting balance - Comments: occasional CGA assist for sitting balance when pt was donning sock seated EOB, static sitting was supervision    Standing balance support: Single extremity supported;During functional activity Standing balance-Leahy Scale: Fair Standing balance comment: CGA for standing balance                           ADL either performed or assessed with clinical judgement  ADL Overall ADL's : Needs assistance/impaired Eating/Feeding: NPO Eating/Feeding Details (indicate cue type and reason): NPO until SLP can evaluate swallowing Grooming: Standing;Min guard;Cueing for safety   Upper Body Bathing: Sitting;Minimal assistance;Cueing for safety   Lower  Body Bathing: Sit to/from stand;Minimal assistance;Cueing for safety   Upper Body Dressing : Sitting;Min guard   Lower Body Dressing: Sit to/from stand;Min guard Lower Body Dressing Details (indicate cue type and reason): pt able to don socks seated EOB with CGA for sitting balance, requires CGA for transfers and in standing as well    Toilet Transfer Details (indicate cue type and reason): pt stood EOB to use urinal with CGA and cues for safety, as pt initiated movement without warning                  Vision Baseline Vision/History: Wears glasses;Macular Degeneration Wears Glasses: At all times Patient Visual Report: No change from baseline       Perception     Praxis      Pertinent Vitals/Pain Pain Assessment: No/denies pain     Hand Dominance Right   Extremity/Trunk Assessment Upper Extremity Assessment Upper Extremity Assessment: Overall WFL for tasks assessed(grossly 4+/5 bilaterally, coordination and sensation intact)   Lower Extremity Assessment Lower Extremity Assessment: Defer to PT evaluation;Generalized weakness(grossly at least 4-/5 bilaterally, pt denies sensory deficits)       Communication Communication Communication: HOH   Cognition Arousal/Alertness: Awake/alert Behavior During Therapy: WFL for tasks assessed/performed Overall Cognitive Status: History of cognitive impairments - at baseline                                 General Comments: pt alert and oriented, follows simple commands well (HOH), some mild difficulty with STM/recall of past events within last year   General Comments       Exercises     Shoulder Instructions      Home Living Family/patient expects to be discharged to:: Private residence Living Arrangements: Spouse/significant other Available Help at Discharge: Family;Personal care attendant;Available 24 hours/day(PCA during the day 6d/wk, family during the night and on PCA's day off) Type of Home:  House Home Access: Level entry     Home Layout: One level     Bathroom Shower/Tub: Tub/shower unit;Walk-in shower   Bathroom Toilet: Handicapped height     Home Equipment: Shower seat - built in;Grab bars - tub/shower;Grab bars - toilet;Walker - 2 wheels;Cane - single point          Prior Functioning/Environment Level of Independence: Needs assistance  Gait / Transfers Assistance Needed: ambulating with SPC very short household distances, otherwise using manual w/c to self propel longer household distances, at least 3-4 falls in past 12 months (LOB, decreased safety awareness) ADL's / Homemaking Assistance Needed: PCA assists with bathing, housekeeping, meals; family/PCA assist with med mgt; family (dtr) provides transportation; pt generally able to perform dressing and toileting independently, pt uses urinal for overnight toileting needs beside bed            OT Problem List: Decreased strength;Decreased knowledge of use of DME or AE;Decreased activity tolerance;Decreased safety awareness;Impaired balance (sitting and/or standing)      OT Treatment/Interventions: Self-care/ADL training;Balance training;Therapeutic exercise;Therapeutic activities;Energy conservation;Cognitive remediation/compensation;DME and/or AE instruction;Patient/family education;Visual/perceptual remediation/compensation    OT Goals(Current goals can be found in the care plan section) Acute Rehab OT Goals Patient Stated Goal: to get stronger and [facility] Goal  Formulation: With patient/family Time For Goal Achievement: 10/10/17 Potential to Achieve Goals: Good ADL Goals Pt Will Perform Lower Body Dressing: sit to/from stand;with supervision(AE as needed) Pt Will Transfer to Toilet: ambulating;with supervision(LRAD for amb, elevated commode)  OT Frequency: Min 1X/week   Barriers to D/C:            Co-evaluation              AM-PAC PT "6 Clicks" Daily Activity     Outcome Measure  Help from another person eating meals?: A Little(NPO, but physically able to self feed) Help from another person taking care of personal grooming?: A Little Help from another person toileting, which includes using toliet, bedpan, or urinal?: A Little Help from another person bathing (including washing, rinsing, drying)?: A Little Help from another person to put on and taking off regular upper body clothing?: A Little Help from another person to put on and taking off regular lower body clothing?: A Little 6 Click Score: 18   End of Session    Activity Tolerance: Patient tolerated treatment well Patient left: in bed;with call bell/phone within reach;with bed alarm set;with family/visitor present  OT Visit Diagnosis: Other abnormalities of gait and mobility (R26.89);Repeated falls (R29.6);Muscle weakness (generalized) (M62.81)                Time: 0174-9449 OT Time Calculation (min): 30 min Charges:  OT General Charges $OT Visit: 1 Visit OT Evaluation $OT Eval Low Complexity: 1 Low OT Treatments $Self Care/Home Management : 8-22 mins  Jeni Salles, MPH, MS, OTR/L ascom [phone] 09/26/17, 5:30 PM

## 2017-09-26 NOTE — ED Provider Notes (Signed)
[facility] Emergency Department Provider Note  ____________________________________________  Time seen: Approximately 12:05 PM  I have reviewed the triage vital signs and the nursing notes.   HISTORY  Chief Complaint Aphasia    HPI Albert Boone is a 84 y.o. male w/ a history of dementia, prior CVA, HTN, HL, A. fib, presenting for dysphasia.  The patient was seen here yesterday for difficulty swallowing and had a reassuring work-up including CT scan, and was able to swallow liquids.  He was offered admission but opted to go home with family.  This morning, the patient reports a dry throat and tried to take liquid Tussionex which resulted in coughing.  He then was unable to take water so he came in to the emergency department for further evaluation.  He feels that her symptoms are better when the back of his throat is more moist or has mucus.  The patient denies any headache or new symptoms.  Past Medical History:  Diagnosis Date  . A-fib ([place])   . Dementia   . GERD (gastroesophageal reflux disease)   . Hyperlipemia   . Hypertension   . Prostatitis   . Renal disorder   . Stroke [facility])    mini strokes  . TIA (transient ischemic attack)     Patient Active Problem List   Diagnosis Date Noted  . Urinary retention 09/18/2017  . A-fib ([place]) 05/26/2017  . Hyperlipidemia 05/26/2017  . CKD (chronic kidney disease) stage 3, GFR 30-59 ml/min (HCC) 05/26/2017  . BPH associated with nocturia 05/26/2017  . TIA (transient ischemic attack) 04/20/2017  . Seizure ([place]) 01/05/2016  . Sepsis ([place]) 08/21/2015    Past Surgical History:  Procedure Laterality Date  . SKIN CANCER EXCISION     multiple times    Current Outpatient Rx  . Order #: [id number] Class: Historical Med  . Order #: [id number] Class: Historical Med  . Order #: [id number] Class: Historical Med  . Order #: [id number] Class: Normal  . Order #: [id number] Class: Historical Med  . Order #:  [id number] Class: Normal  . Order #: [id number] Class: Print  . Order #: [id number] Class: Historical Med  . Order #: [id number] Class: Historical Med  . Order #: [id number] Class: Historical Med  . Order #: [id number] Class: Normal  . Order #: [id number] Class: Historical Med  . Order #: [id number] Class: Normal    Allergies Ace inhibitors; Nsaids; Clopidogrel; Penicillins; and Tramadol  Family History  Problem Relation Age of Onset  . Hypertension Father   . Prostate cancer Neg Hx   . Hematuria Neg Hx   . Kidney disease Neg Hx     Social History Social History   Tobacco Use  . Smoking status: Former Smoker    Types: Cigarettes    Last attempt to quit: 08/18/1966    Years since quitting: 51.1  . Smokeless tobacco: Never Used  Substance Use Topics  . Alcohol use: Yes    Alcohol/week: 4.0 standard drinks    Types: 4 Glasses of wine per week  . Drug use: No    Review of Systems Constitutional: No fever/chills.  No lightheadedness or syncope. Eyes: No visual changes.  No blurred or double vision. ENT: No sore throat.  Positive dry throat.  No congestion or rhinorrhea. Cardiovascular: Denies chest pain. Denies palpitations. Respiratory: Denies shortness of breath.  No cough. Gastrointestinal: No abdominal pain.  No nausea, no vomiting.  No diarrhea.  No constipation. Genitourinary: Negative for dysuria. Musculoskeletal: Negative for back pain. Skin: Negative for rash. Neurological: Negative for headaches.  No focal numbness, tingling or weakness.  No difficulty swallowing.    ____________________________________________   PHYSICAL EXAM:  VITAL SIGNS: ED Triage Vitals  Enc Vitals Group     BP 09/26/17 1152 (!) 129/106     Pulse Rate 09/26/17 1152 87     Resp 09/26/17 1152 18     Temp 09/26/17 1152 (!) 97.4 F (36.3 C)     Temp src --      SpO2 09/26/17 1152 98 %     Weight 09/26/17 1153 135 lb (61.2 kg)     Height 09/26/17 1153 5\' 8"  (1.727 m)     Head Circumference --       Peak Flow --      Pain Score 09/26/17 1153 0     Pain Loc --      Pain Edu? --      Excl. in [place]? --     Constitutional: Alert and oriented.  Answers questions appropriately.  Chronically ill appearing. Eyes: Conjunctivae are normal.  EOMI. No scleral icterus. Head: Atraumatic. Nose: No congestion/rhinnorhea. Mouth/Throat: Mucous membranes are dry.Posterior pharynx is without erythema, tonsillar swelling or exudate.  No trismus, drooling, or hoarse voice.  Neck: No stridor.  Supple.  No JVD.  No meningismus. Cardiovascular: Normal rate, regular rhythm. No murmurs, rubs or gallops.  Respiratory: Normal respiratory effort.  No accessory muscle use or retractions. Lungs CTAB.  No wheezes, rales or ronchi. Musculoskeletal: No LE edema. Neurologic: alert.  Speech is clear.  Face and smile are symmetric.  EOMI.  Moves all extremities well. Skin:  Skin is warm, dry and intact. No rash noted. Psychiatric: Mood and affect are normal.   ____________________________________________   LABS (all labs ordered are listed, but only abnormal results are displayed)  Labs Reviewed  CBC  BASIC METABOLIC PANEL   ____________________________________________  EKG  ED ECG REPORT I, Anne-Caroline Mariea Clonts, the attending physician, personally viewed and interpreted this ECG.   Date: 09/26/2017  EKG Time: 1150  Rate: 90  Rhythm: normal sinus rhythm with sinus arrhythmia; RBBB  Axis: normal  Intervals:none  ST&T Change: No STEMI  ____________________________________________  RADIOLOGY  No results found.  ____________________________________________   PROCEDURES  Procedure(s) performed: None  Procedures  Critical Care performed: No ____________________________________________   INITIAL IMPRESSION / ASSESSMENT AND PLAN / ED COURSE  Pertinent labs & imaging results that were available during my care of the patient were reviewed by me and considered in my medical decision making (see  chart for details).  84 y.o. male with several days of progressively worsening dysphasia.  Overall, the patient is hemodynamically stable.  I have done a bedside swallow myself, and he is able to keep down liquid and feels better, however, he does have throat clearing after drinking.  I am concerned that he is having microaspiration.  There are multiple possible causes including CVA or bulbar dysfunction, dry throat, local muscle deterioration, or changes related to his dementia.  I have talked to the patient about his wishes, and he does wish to stay at the hospital.  Will order basic laboratory studies, initiate intravenous fluids and keep him n.p.o., and admit him for MRI and swallow evaluation, continued evaluation and treatment.  ____________________________________________  FINAL CLINICAL IMPRESSION(S) / ED DIAGNOSES  Final diagnoses:  Dysphagia, unspecified type         NEW MEDICATIONS STARTED DURING THIS VISIT:  New Prescriptions   No medications on file      Eula Listen, MD 09/26/17 1219

## 2017-09-27 ENCOUNTER — Inpatient Hospital Stay
Admit: 2017-09-27 | Discharge: 2017-09-27 | Disposition: A | Discharge status: Home / Self Care | Payer: PPO | Attending: Internal Medicine | Admitting: Internal Medicine

## 2017-09-27 LAB — BASIC METABOLIC PANEL
Anion gap: 10 (ref 5–15)
BUN: 14 mg/dL (ref 8–23)
CHLORIDE: 106 mmol/L (ref 98–111)
CO2: 22 mmol/L (ref 22–32)
CREATININE: 0.98 mg/dL (ref 0.61–1.24)
Calcium: 8.5 mg/dL — ABNORMAL LOW (ref 8.9–10.3)
GFR calc Af Amer: >60 mL/min (ref >60)
GFR calc non Af Amer: >60 mL/min (ref >60)
GLUCOSE: 63 mg/dL — AB (ref 70–99)
POTASSIUM: 3.9 mmol/L (ref 3.5–5.1)
SODIUM: 138 mmol/L (ref 135–145)

## 2017-09-27 LAB — LIPID PANEL
CHOL/HDL RATIO: 3.7 ratio
Cholesterol: 159 mg/dL (ref 0–200)
HDL: 43 mg/dL (ref >40)
LDL CALC: 103 mg/dL — AB (ref 0–99)
TRIGLYCERIDES: 66 mg/dL (ref <150)
VLDL: 13 mg/dL (ref 0–40)

## 2017-09-27 LAB — CBC
HEMATOCRIT: 40 % (ref 40.0–52.0)
Hemoglobin: 13.7 g/dL (ref 13.0–18.0)
MCH: 31.6 pg (ref 26.0–34.0)
MCHC: 34.4 g/dL (ref 32.0–36.0)
MCV: 92 fL (ref 80.0–100.0)
PLATELETS: 184 10*3/uL (ref 150–440)
RBC: 4.34 MIL/uL — ABNORMAL LOW (ref 4.40–5.90)
RDW: 14.3 % (ref 11.5–14.5)
WBC: 6.8 10*3/uL (ref 3.8–10.6)

## 2017-09-27 LAB — HEMOGLOBIN A1C
HEMOGLOBIN A1C: 6.1 % — AB (ref 4.8–5.6)
Mean Plasma Glucose: 128 mg/dL

## 2017-09-27 MED ORDER — DEXTROSE-NACL 5-0.45 % IV SOLN
INTRAVENOUS | Status: DC
  Administered 2017-09-27: 12:00:00 via INTRAVENOUS

## 2017-09-27 MED ORDER — HYDRALAZINE HCL 20 MG/ML IJ SOLN: 10.0000 mg | INTRAMUSCULAR | Status: DC | PRN

## 2017-09-27 NOTE — Progress Notes (Signed)
*  PRELIMINARY RESULTS* Echocardiogram 2D Echocardiogram has been performed.  Albert Boone 09/27/2017, 1:05 PM

## 2017-09-27 NOTE — Progress Notes (Signed)
[name] [place] NAME: Albert Boone    MR#:  [id number]  DATE OF BIRTH:  1922-08-03  SUBJECTIVE:  CHIEF COMPLAINT:   Chief Complaint  Patient presents with  . Aphasia    REVIEW OF SYSTEMS:  CONSTITUTIONAL: No fever, fatigue or weakness.  EYES: No blurred or double vision.  EARS, NOSE, AND THROAT: No tinnitus or ear pain.  RESPIRATORY: No cough, shortness of breath, wheezing or hemoptysis.  CARDIOVASCULAR: No chest pain, orthopnea, edema.  GASTROINTESTINAL: No nausea, vomiting, diarrhea or abdominal pain.  GENITOURINARY: No dysuria, hematuria.  ENDOCRINE: No polyuria, nocturia,  HEMATOLOGY: No anemia, easy bruising or bleeding SKIN: No rash or lesion. MUSCULOSKELETAL: No joint pain or arthritis.   NEUROLOGIC: No tingling, numbness, weakness.  PSYCHIATRY: No anxiety or depression.   ROS  DRUG ALLERGIES:   Allergies  Allergen Reactions  . Ace Inhibitors Other (See Comments)    Renal failure  . Nsaids Other (See Comments)    Renal failure  . Clopidogrel Diarrhea  . Penicillins Rash    Has patient had a PCN reaction causing immediate rash, facial/tongue/throat swelling, SOB or lightheadedness with hypotension: Yes Has patient had a PCN reaction causing severe rash involving mucus membranes or skin necrosis: No Has patient had a PCN reaction that required hospitalization No Has patient had a PCN reaction occurring within the last 10 years: No If all of the above answers are "NO", then may proceed with Cephalosporin use.   . Tramadol Rash    With itching    VITALS:  Blood pressure (!) 179/97, pulse 69, temperature 98 F (36.7 C), temperature source Oral, resp. rate 20, height 5\' 8"  (1.727 m), weight 61.2 kg, SpO2 99 %.  PHYSICAL EXAMINATION:  GENERAL:  84 y.o.-year-old patient lying in the bed with no acute distress.  EYES: Pupils equal, round, reactive to light and accommodation. No scleral icterus. Extraocular  muscles intact.  HEENT: Head atraumatic, normocephalic. Oropharynx and nasopharynx clear.  NECK:  Supple, no jugular venous distention. No thyroid enlargement, no tenderness.  LUNGS: Normal breath sounds bilaterally, no wheezing, rales,rhonchi or crepitation. No use of accessory muscles of respiration.  CARDIOVASCULAR: S1, S2 normal. No murmurs, rubs, or gallops.  ABDOMEN: Soft, nontender, nondistended. Bowel sounds present. No organomegaly or mass.  EXTREMITIES: No pedal edema, cyanosis, or clubbing.  NEUROLOGIC: Cranial nerves II through XII are intact. Muscle strength 5/5 in all extremities. Sensation intact. Gait not checked.  PSYCHIATRIC: The patient is alert and oriented x 3.  SKIN: No obvious rash, lesion, or ulcer.   Physical Exam LABORATORY PANEL:   CBC Recent Labs  Lab 09/27/17 0420  WBC 6.8  HGB 13.7  HCT 40.0  PLT 184   ------------------------------------------------------------------------------------------------------------------  Chemistries  Recent Labs  Lab 09/25/17 1134  09/27/17 0420  NA 140   < > 138  K 4.0   < > 3.9  CL 107   < > 106  CO2 26   < > 22  GLUCOSE 101*   < > 63*  BUN 16   < > 14  CREATININE 1.09   < > 0.98  CALCIUM 8.8*   < > 8.5*  AST 16  --   --   ALT 11  --   --   ALKPHOS 45  --   --   BILITOT 0.8  --   --    < > = values in this interval not displayed.   ------------------------------------------------------------------------------------------------------------------  Cardiac Enzymes  Recent Labs  Lab 09/25/17 1134  TROPONINI <0.03   ------------------------------------------------------------------------------------------------------------------  RADIOLOGY:  Mr Brain Wo Contrast  Result Date: 09/26/2017 CLINICAL DATA:  Subacute neuro deficit.  Trouble swallowing. EXAM: MRI HEAD WITHOUT CONTRAST TECHNIQUE: Multiplanar, multiecho pulse sequences of the brain and surrounding structures were obtained without intravenous  contrast. COMPARISON:  Head CT from yesterday.  Brain MRI 04/20/2017 FINDINGS: Brain: No acute or interval infarction, hemorrhage, hydrocephalus, extra-axial collection or mass lesion. Fairly typical for age chronic small vessel ischemia and cerebral volume loss. No abnormality seen in the brainstem, cisterns, or skull base Vascular: Major flow voids are preserved. Apparent wall thickening of the bilateral cervical ICA at the skull base is likely atheromatous. There is a similar appearance on prior. Skull and upper cervical spine: No evidence of marrow lesion Sinuses/Orbits: Bilateral cataract resection.  No acute finding. Other: The visualized oropharynx and hypopharynx is patent. IMPRESSION: Aging brain without acute finding or change from 04/20/2017. Electronically Signed   By: Monte Fantasia M.D.   On: 09/26/2017 16:16    ASSESSMENT AND PLAN:  *Acute on chronic dysphagia  Suspect related to chronic worsening dementia  Speech therapy input appreciated-started on dysphagia 2 diet with thin liquids, and discussion with speech therapy-we will need to monitor over the next 24 to 48 hours before improvement, possible need for modified barium swallow study, family meeting had-all questions answered   *Acute accelerated hypertension  Hydralazine as needed, vitals per routine, make changes as per necessary   *History of atrial fibrillation  Stable  continue on Eliquis and Cardizem  *BPH  Continue on Rapaflo  *Chronic kidney disease stage III Stable avoid nephrotoxic agents  *History of macular degeneration Stable  Long-term prognosis is poor due to dementia-consult palliative care  All the records are reviewed and case discussed with Care Management/Social Workerr. Management plans discussed with the patient, family and they are in agreement.  CODE STATUS: full  TOTAL TIME TAKING CARE OF THIS PATIENT: 45 minutes.     POSSIBLE D/C IN 1 DAYS, DEPENDING ON CLINICAL  CONDITION.   Avel Peace Salary M.D on 09/27/2017   Between 7am to 6pm - Pager - [phone]  After 6pm go to [email or web address] - password [email or web address] [facility]  Office  [phone]  CC: Primary care physician; Rusty Aus, MD  Note: This dictation was prepared with Dragon dictation along with smaller phrase technology. Any transcriptional errors that result from this process are unintentional.

## 2017-09-27 NOTE — Progress Notes (Signed)
Physical Therapy Evaluation Patient Details Name: Albert Boone MRN: [id number] DOB: 07-03-1922 Today's Date: 09/27/2017   History of Present Illness  84 y.o. male with a known history of trouble swallowing.  He came to the ER yesterday 09/25/17 and had trouble swallowing.  He was then able to swallow and was sent home.  Same thing happened this morning where he was unable to swallow very well.  He tried taking some [place] and actually vomited that up.  Hard for him to take pills. Pt has had a lot of mucus and postnasal drip and runny nose.  Hospitalist services were contacted for further evaluation because of his trouble swallowing. Pt has PMH of A-fib, dementia, GERD, HTN, CVA, TIA, CKD stage III, and macular degeneration.  Clinical Impression  Pt is pleasant on arrival and ready to work with PT. Pt has generalized weakness in B extremities, L side slightly greater than R. No sensory deficits appreciated at this time. Coordination WNL B UE and LEs, tests used included finger-to-nose and foot-to-knee-foot-to-ankle. Pt requires Min A with bed mobility and CGA to perform sit-to-stand. At baseline pt uses SPC to amb in home very short distances. Currently pt feels weak during amb. He demonstrates unsafe use of SPC, but with RW has greater stability. Pt is appropriate for PT and will continue to progress strength and gait training with RW for improved ability to amb safely in the home.    Follow Up Recommendations Home health PT;Supervision/Assistance - 24 hour    Equipment Recommendations  None recommended by PT    Recommendations for Other Services       Precautions / Restrictions Precautions Precautions: Fall Restrictions Weight Bearing Restrictions: No      Mobility  Bed Mobility Overal bed mobility: Needs Assistance Bed Mobility: Supine to Sit     Supine to sit: HOB elevated;Min assist     General bed mobility comments: Requires Min A for trunk support to sit  EOB  Transfers Overall transfer level: Needs assistance Equipment used: Straight cane Transfers: Sit to/from Stand Sit to Stand: Min guard         General transfer comment: additional time/effort required to come to stand  Ambulation/Gait Ambulation/Gait assistance: Min assist Gait Distance (Feet): 20 Feet Assistive device: Straight cane Gait Pattern/deviations: Decreased step length - right;Decreased step length - left     General Gait Details: Pt amb with SPC, has B dec step length, dec step clearance, and trunk lean toward R side. With [facility] pt has difficulty maintain balance and does not use cane in safe manner.   Stairs            Wheelchair Mobility    Modified Rankin (Stroke Patients Only)       Balance Overall balance assessment: History of Falls;Needs assistance Sitting-balance support: Feet supported;No upper extremity supported Sitting balance-Leahy Scale: Fair Sitting balance - Comments: CGA for sitting balance, VC for post lean which pt quickly corrected and could maintain   Standing balance support: Single extremity supported Standing balance-Leahy Scale: Fair Standing balance comment: CGA for standing balance                             Pertinent Vitals/Pain Pain Assessment: No/denies pain    Home Living Family/patient expects to be discharged to:: Private residence Living Arrangements: Spouse/significant other Available Help at Discharge: Family;Personal care attendant;Available 24 hours/day Type of Home: House Home Access: Level entry  Home Layout: One level Home Equipment: Shower seat - built in;Grab bars - tub/shower;Grab bars - toilet;Walker - 2 wheels;Cane - single point      Prior Function Level of Independence: Needs assistance   Gait / Transfers Assistance Needed: Pt states they amb with SPC short household distances, otherwise using manual w/c for longer household distances.  ADL's / Homemaking Assistance Needed:  24/7 assistance with bathing, housekeeping, meals, med mgt, and transportation provided by PCA/family.        Hand Dominance   Dominant Hand: Right    Extremity/Trunk Assessment   Upper Extremity Assessment Upper Extremity Assessment: Generalized weakness(R 4-/5, L 3+/5)    Lower Extremity Assessment Lower Extremity Assessment: Generalized weakness(Bilat. 3+/5)       Communication   Communication: HOH  Cognition Arousal/Alertness: Awake/alert Behavior During Therapy: WFL for tasks assessed/performed Overall Cognitive Status: History of cognitive impairments - at baseline                                 General Comments: Pt follows simple commands well (HOH).      General Comments      Exercises Other Exercises Other Exercises: Pt amb with RW for 20 feet. Pt demonstrates no LOB and inc step length with RW. He is much safer to amb only needs CGA. Pt dislikes using RW. Pt denies difference in stability with RW. Other Exercises: Supine pt performs ankle pumps, SLRs, hip ABD/ADD with supervision with VC for technique. All ther-ex performed x10 reps Other Exercises: Sitting pt performs seated marching and LAQ with supervision and VC for technique. All ther-ex performed x10 reps   Assessment/Plan    PT Assessment Patient needs continued PT services  PT Problem List Decreased strength;Decreased activity tolerance;Decreased balance;Decreased mobility;Decreased safety awareness       PT Treatment Interventions Gait training    PT Goals (Current goals can be found in the Care Plan section)  Acute Rehab PT Goals Patient Stated Goal: to get [facility] Goal Formulation: With patient Time For Goal Achievement: 10/10/17 Potential to Achieve Goals: Fair Additional Goals Additional Goal #1: Pt will be able to perform bed mobility and transfers with supervision to improve functional ability in the home.    Frequency Min 2X/week   Barriers  to discharge        Co-evaluation               AM-PAC PT "6 Clicks" Daily Activity  Outcome Measure Difficulty turning over in bed (including adjusting bedclothes, sheets and blankets)?: Unable Difficulty moving from lying on back to sitting on the side of the bed? : Unable Difficulty sitting down on and standing up from a chair with arms (e.g., wheelchair, bedside commode, etc,.)?: Unable Help needed moving to and from a bed to chair (including a wheelchair)?: A Little Help needed walking in hospital room?: A Lot Help needed climbing 3-5 steps with a railing? : A Lot 6 Click Score: 10    End of Session Equipment Utilized During Treatment: Gait belt Activity Tolerance: Patient tolerated treatment well Patient left: in chair;with call bell/phone within reach;with chair alarm set;with family/visitor present Nurse Communication: Mobility status PT Visit Diagnosis: Unsteadiness on feet (R26.81);Muscle weakness (generalized) (M62.81);History of falling (Z91.81);Other abnormalities of gait and mobility (R26.89)    Time: 1027-2536 PT Time Calculation (min) (ACUTE ONLY): 30 min   Charges:   PT Evaluation $PT Eval  Low Complexity: 1 Low PT Treatments $Gait Training: 8-22 mins        Algis Downs, SPT   Algis Downs 09/27/2017, 1:24 PM

## 2017-09-27 NOTE — Evaluation (Signed)
Clinical/Bedside Swallow Evaluation Patient Details  Name: Albert Boone MRN: [id number] Date of Birth: 19-May-1922  Today's Date: 09/27/2017 Time: SLP Start Time (ACUTE ONLY): 70 SLP Stop Time (ACUTE ONLY): 1030 SLP Time Calculation (min) (ACUTE ONLY): 70 min  Past Medical History:  Past Medical History:  Diagnosis Date  . A-fib ([place])   . Dementia   . GERD (gastroesophageal reflux disease)   . Hyperlipemia   . Hypertension   . Prostatitis   . Renal disorder   . Stroke [facility])    mini strokes  . TIA (transient ischemic attack)    Past Surgical History:  Past Surgical History:  Procedure Laterality Date  . SKIN CANCER EXCISION     multiple times   HPI:  Albert Boone  is a 84 y.o. male with a known history of trouble swallowing.  He came to the ER yesterday and had trouble swallowing.  He was then able to swallow and was sent home.  Same thing happened this morning where he was unable to swallow very well.  He tried taking some [place] and actually vomited that up.  Hard for him to take pills.  He has been having lots of mucus and postnasal drip and runny nose.  Hospitalist services were contacted for further evaluation because of his trouble swallowing.  ER physician was concerned about stroke.   Assessment / Plan / Recommendation Clinical Impression   Patient presents with mild-moderate oral (suspect oropharyngeal) dysphagia c/b decreased labial seal, decreased mandibular ROM, weak volitional/reflexive cough, cognitive deficits resulting in minimal anterior oral spillage, intermittent delayed throat clearing, intermittent delayed coughing episodes, expelling of clear secretions from oral cavity (possibly saliva), and minimal oral residue on lingual surface following puree/soft solids. Patient observed expelling of clear secretions throughout evaluation, however frequency decreased as session progressed. SLP encouraged patient to swallow saliva frequently throughout  evaluation (potentiall related to decreased sensation in pharyngeal cavity and/or cognitive deficits. D/t hx of CVA, hx of dysphagia (resulting in multiple visits to ED), poor PO intake per family report, hx of GERD, diagnosis of dementia, and throat clearing/coughing episodes observed and reported by family, SLP recommends objective swallowing assessment via MBS to determine oropharyngeal swallowing function related to recent dysphagia in order to determine best treatment plan for patient and rule out aspiration on current diet. Recommended PO diet at this time include Dysphagia 2 (minced) and thin liquids via cup only (no straws). Medications recommended to be crushed in puree consistency. Education provided to patient and family present in room re: diet recommendations, aspiration precautions (posted in room), and MBS recommendation. F/u with primary SLP for MBS in 1-2 days. Patient and family verbalized understanding and agreement. NSG also verbalized understanding and agreement to diet recommendations, aspiration precautions, and f/u with MBS.   SLP Visit Diagnosis: Dysphagia, oral phase (R13.11)    Aspiration Risk  Risk for inadequate nutrition/hydration;Moderate aspiration risk    Diet Recommendation Dysphagia 2 (Fine chop);Thin liquid   Liquid Administration via: Cup;No straw Medication Administration: Crushed with puree Supervision: Patient able to self feed;Intermittent supervision to cue for compensatory strategies Compensations: Minimize environmental distractions;Slow rate;Small sips/bites;Follow solids with liquid Postural Changes: Seated upright at 90 degrees    Other  Recommendations Oral Care Recommendations: Oral care before and after PO   Follow up Recommendations Home health SLP      Frequency and Duration min 3x week  2 weeks       Prognosis Prognosis for Safe Diet Advancement: Fair Barriers to Reach Goals:  Cognitive deficits;Severity of deficits      Swallow Study    General Date of Onset: 09/27/17 HPI: Albert Boone  is a 84 y.o. male with a known history of trouble swallowing.  He came to the ER yesterday and had trouble swallowing.  He was then able to swallow and was sent home.  Same thing happened this morning where he was unable to swallow very well.  He tried taking some [place] and actually vomited that up.  Hard for him to take pills.  He has been having lots of mucus and postnasal drip and runny nose.  Hospitalist services were contacted for further evaluation because of his trouble swallowing.  ER physician was concerned about stroke. Type of Study: Bedside Swallow Evaluation Previous Swallow Assessment: Patient and family reported no hx of swallowing assessment and/or swallowing therapy.  Diet Prior to this Study: Regular;Thin liquids Respiratory Status: Room air History of Recent Intubation: No Behavior/Cognition: Alert;Pleasant mood Oral Cavity Assessment: Other (comment)(Observed white patches on left side on lingual surface, potentially indicating oral thrush. NSG notified. ) Oral Care Completed by SLP: Yes Oral Cavity - Dentition: Missing dentition(Partial plates not available at hospital) Vision: Functional for self-feeding Self-Feeding Abilities: Able to feed self;Needs set up Patient Positioning: Upright in chair Baseline Vocal Quality: Low vocal intensity Volitional Cough: Weak Volitional Swallow: Able to elicit    Oral/Motor/Sensory Function Overall Oral Motor/Sensory Function: Generalized oral weakness(Decreased labial closure) Facial Symmetry: Within Functional Limits Lingual ROM: Within Functional Limits   Ice Chips Ice chips: Within functional limits Presentation: Spoon   Thin Liquid Thin Liquid: Impaired Presentation: Cup Oral Phase Impairments: Reduced labial seal Pharyngeal  Phase Impairments: Suspected delayed Swallow;Multiple swallows;Throat Clearing - Delayed;Cough - Delayed    Nectar Thick Nectar Thick Liquid:  Not tested   Honey Thick Honey Thick Liquid: Not tested   Puree Puree: Within functional limits Presentation: Spoon;Self Fed   Solid     Solid: Impaired Presentation: Self Fed Pharyngeal Phase Impairments: Multiple swallows;Throat Clearing - Delayed;Cough - Delayed     Loni Beckwith, M.S. CCC-SLP Speech-Language Pathologist  Loni Beckwith 09/27/2017,10:43 AM

## 2017-09-28 LAB — ECHOCARDIOGRAM COMPLETE
Height: 68 in
Weight: 2160.01 oz

## 2017-09-28 MED ORDER — FLUTICASONE PROPIONATE 50 MCG/ACT NA SUSP: 2.0000 | Freq: Every day | NASAL | 0 refills | Status: DC

## 2017-09-28 NOTE — Care Management (Signed)
Spoke with Mr. Albert Boone at the home. Discussed Home Health services in the home. Mr. Albert Boone had [place] in the past, States I don't know, call me back in a day or two."  Mr. Albert Boone has 24/7 caregivers in the home. Shelbie Ammons RN MSN CCM Care Management [phone]

## 2017-09-28 NOTE — Progress Notes (Signed)
SLP Cancellation Note  Patient Details Name: Albert Boone MRN: [id number] DOB: Mar 07, 1922   Cancelled treatment:       Reason Eval/Treat Not Completed: (chart reviewed; pt has discharged home w/ family). Attempted to see pt in room at lunch today re: his recommended diet, dysphagia; however, pt had discharged home w/ family. Palliative Care team had attempted a visit w/ pt/family before lunch(and discharge) per NSG report. Noted MD's note indicating "long-term prognosis is poor due to dementia".     Orinda Kenner, MS, CCC-SLP Kitti Mcclish 09/28/2017, 12:45 PM

## 2017-09-28 NOTE — Discharge Summary (Signed)
[facility] [place] NAME: Albert Boone    MR#:  [id number]  DATE OF BIRTH:  Jul 01, 1922  DATE OF ADMISSION:  09/26/2017 ADMITTING PHYSICIAN: Loletha Grayer, MD  DATE OF DISCHARGE: No discharge date for patient encounter.  PRIMARY CARE PHYSICIAN: Rusty Aus, MD    ADMISSION DIAGNOSIS:  Dysphagia, unspecified type [R13.10]  DISCHARGE DIAGNOSIS:  Active Problems:   Dysphagia   SECONDARY DIAGNOSIS:   Past Medical History:  Diagnosis Date  . A-fib ([place])   . Dementia   . GERD (gastroesophageal reflux disease)   . Hyperlipemia   . Hypertension   . Prostatitis   . Renal disorder   . Stroke [facility])    mini strokes  . TIA (transient ischemic attack)     HOSPITAL COURSE:   *Acute on chronic dysphagia  Due to worsening dementia  Speech therapy did see patient while in house -recommended  dysphagia 2 diet with thin liquids with continued speech therapy status post discharge,, now ready for discharge to home with continued outpatient speech therapy MRI negative for stroke  *Acute accelerated hypertension  Stable on current regiment  *History of atrial fibrillation  Stable  continued on Eliquis and Cardizem  *BPH  Continued on Rapaflo  *Chronic kidney disease stage III Stable avoid nephrotoxic agents  *History of macular degeneration Stable  Long-term prognosis is poor due to dementia-consulted palliative care while in house  DISCHARGE CONDITIONS:   stable  CONSULTS OBTAINED:    DRUG ALLERGIES:   Allergies  Allergen Reactions  . Ace Inhibitors Other (See Comments)    Renal failure  . Nsaids Other (See Comments)    Renal failure  . Clopidogrel Diarrhea  . Penicillins Rash    Has patient had a PCN reaction causing immediate rash, facial/tongue/throat swelling, SOB or lightheadedness with hypotension: Yes Has patient had a PCN reaction causing severe rash involving mucus membranes or skin  necrosis: No Has patient had a PCN reaction that required hospitalization No Has patient had a PCN reaction occurring within the last 10 years: No If all of the above answers are "NO", then may proceed with Cephalosporin use.   . Tramadol Rash    With itching    DISCHARGE MEDICATIONS:   Allergies as of 09/28/2017      Reactions   Ace Inhibitors Other (See Comments)   Renal failure   Nsaids Other (See Comments)   Renal failure   Clopidogrel Diarrhea   Penicillins Rash   Has patient had a PCN reaction causing immediate rash, facial/tongue/throat swelling, SOB or lightheadedness with hypotension: Yes Has patient had a PCN reaction causing severe rash involving mucus membranes or skin necrosis: No Has patient had a PCN reaction that required hospitalization No Has patient had a PCN reaction occurring within the last 10 years: No If all of the above answers are "NO", then may proceed with Cephalosporin use.   Tramadol Rash   With itching      Medication List    TAKE these medications   apixaban 2.5 MG Tabs tablet Commonly known as:  ELIQUIS Take 2.5 mg by mouth daily.   atorvastatin 10 MG tablet Commonly known as:  LIPITOR Take 10 mg by mouth daily.   clopidogrel 75 MG tablet Commonly known as:  PLAVIX Take 75 mg by mouth daily.   diltiazem 120 MG 24 hr capsule Commonly known as:  CARDIZEM CD Take 1 capsule (120 mg total) by mouth daily.   fluticasone  50 MCG/ACT nasal spray Commonly known as:  FLONASE Place 2 sprays into both nostrils at bedtime.   loperamide 2 MG capsule Commonly known as:  IMODIUM Take 2 mg by mouth 4 (four) times daily as needed for diarrhea or loose stools.   megestrol 40 MG tablet Commonly known as:  MEGACE TAKE ONE TABLET BY MOUTH EVERY DAY   multivitamin with minerals tablet Take 1 tablet by mouth daily.   ondansetron 4 MG disintegrating tablet Commonly known as:  ZOFRAN-ODT Take 1 tablet (4 mg total) by mouth every 8 (eight) hours as  needed for nausea or vomiting.   pantoprazole 40 MG tablet Commonly known as:  PROTONIX Take 40 mg by mouth daily.   silodosin 4 MG Caps capsule Commonly known as:  RAPAFLO Take 1 capsule (4 mg total) by mouth daily with breakfast.        DISCHARGE INSTRUCTIONS:   If you experience worsening of your admission symptoms, develop shortness of breath, life threatening emergency, suicidal or homicidal thoughts you must seek medical attention immediately by calling 911 or calling your MD immediately  if symptoms less severe.  You Must read complete instructions/literature along with all the possible adverse reactions/side effects for all the Medicines you take and that have been prescribed to you. Take any new Medicines after you have completely understood and accept all the possible adverse reactions/side effects.   Please note  You were cared for by a hospitalist during your hospital stay. If you have any questions about your discharge medications or the care you received while you were in the hospital after you are discharged, you can call the unit and asked to speak with the hospitalist on call if the hospitalist that took care of you is not available. Once you are discharged, your primary care physician will handle any further medical issues. Please note that NO REFILLS for any discharge medications will be authorized once you are discharged, as it is imperative that you return to your primary care physician (or establish a relationship with a primary care physician if you do not have one) for your aftercare needs so that they can reassess your need for medications and monitor your lab values.    Today   CHIEF COMPLAINT:   Chief Complaint  Patient presents with  . Aphasia    HISTORY OF PRESENT ILLNESS:  84 y.o. male with a known history of trouble swallowing.  He came to the ER yesterday and had trouble swallowing.  He was then able to swallow and was sent home.  Same thing  happened this morning where he was unable to swallow very well.  He tried taking some [place] and actually vomited that up.  Hard for him to take pills.  He has been having lots of mucus and postnasal drip and runny nose.  Hospitalist services were contacted for further evaluation because of his trouble swallowing.  ER physician was concerned about stroke.   VITAL SIGNS:  Blood pressure (!) 147/84, pulse 77, temperature 98 F (36.7 C), temperature source Oral, resp. rate 18, height 5\' 8"  (1.727 m), weight 61.2 kg, SpO2 97 %.  I/O:    Intake/Output Summary (Last 24 hours) at 09/28/2017 1038 Last data filed at 09/27/2017 1500 Gross per 24 hour  Intake 405.89 ml  Output -  Net 405.89 ml    PHYSICAL EXAMINATION:  GENERAL:  84 y.o.-year-old patient lying in the bed with no acute distress.  EYES: Pupils equal, round, reactive to light and accommodation.  No scleral icterus. Extraocular muscles intact.  HEENT: Head atraumatic, normocephalic. Oropharynx and nasopharynx clear.  NECK:  Supple, no jugular venous distention. No thyroid enlargement, no tenderness.  LUNGS: Normal breath sounds bilaterally, no wheezing, rales,rhonchi or crepitation. No use of accessory muscles of respiration.  CARDIOVASCULAR: S1, S2 normal. No murmurs, rubs, or gallops.  ABDOMEN: Soft, non-tender, non-distended. Bowel sounds present. No organomegaly or mass.  EXTREMITIES: No pedal edema, cyanosis, or clubbing.  NEUROLOGIC: Cranial nerves II through XII are intact. Muscle strength 5/5 in all extremities. Sensation intact. Gait not checked.  PSYCHIATRIC: The patient is alert and oriented x 3.  SKIN: No obvious rash, lesion, or ulcer.   DATA REVIEW:   CBC Recent Labs  Lab 09/27/17 0420  WBC 6.8  HGB 13.7  HCT 40.0  PLT 184    Chemistries  Recent Labs  Lab 09/25/17 1134  09/27/17 0420  NA 140   < > 138  K 4.0   < > 3.9  CL 107   < > 106  CO2 26   < > 22  GLUCOSE 101*   < > 63*  BUN 16   < > 14   CREATININE 1.09   < > 0.98  CALCIUM 8.8*   < > 8.5*  AST 16  --   --   ALT 11  --   --   ALKPHOS 45  --   --   BILITOT 0.8  --   --    < > = values in this interval not displayed.    Cardiac Enzymes Recent Labs  Lab 09/25/17 1134  TROPONINI <0.03    Microbiology Results  Results for orders placed or performed during the hospital encounter of 09/09/17  Urine Culture     Status: Abnormal   Collection Time: 09/09/17  4:32 PM  Result Value Ref Range Status   Specimen Description   Final    URINE, CLEAN CATCH Performed at [facility], [address], [place], [place] [address]    Special Requests   Final    NONE Performed at [facility], [address] [place], [place] [address]    Culture >=100,000 COLONIES/mL Su Hoff (A)  Final   Report Status 09/11/2017 FINAL  Final   Organism ID, Bacteria CITROBACTER YOUNGAE (A)  Final      Susceptibility   Citrobacter youngae - MIC*    CEFAZOLIN >=64 RESISTANT Resistant     CEFTRIAXONE <=1 SENSITIVE Sensitive     CIPROFLOXACIN <=0.25 SENSITIVE Sensitive     GENTAMICIN <=1 SENSITIVE Sensitive     IMIPENEM <=0.25 SENSITIVE Sensitive     NITROFURANTOIN 32 SENSITIVE Sensitive     TRIMETH/SULFA <=20 SENSITIVE Sensitive     PIP/TAZO <=4 SENSITIVE Sensitive     * >=100,000 COLONIES/mL CITROBACTER YOUNGAE    RADIOLOGY:  Mr Brain Wo Contrast  Result Date: 09/26/2017 CLINICAL DATA:  Subacute neuro deficit.  Trouble swallowing. EXAM: MRI HEAD WITHOUT CONTRAST TECHNIQUE: Multiplanar, multiecho pulse sequences of the brain and surrounding structures were obtained without intravenous contrast. COMPARISON:  Head CT from yesterday.  Brain MRI 04/20/2017 FINDINGS: Brain: No acute or interval infarction, hemorrhage, hydrocephalus, extra-axial collection or mass lesion. Fairly typical for age chronic small vessel ischemia and cerebral volume loss. No abnormality seen in the brainstem, cisterns, or skull base Vascular:  Major flow voids are preserved. Apparent wall thickening of the bilateral cervical ICA at the skull base is likely atheromatous. There is a similar appearance on prior.  Skull and upper cervical spine: No evidence of marrow lesion Sinuses/Orbits: Bilateral cataract resection.  No acute finding. Other: The visualized oropharynx and hypopharynx is patent. IMPRESSION: Aging brain without acute finding or change from 04/20/2017. Electronically Signed   By: Monte Fantasia M.D.   On: 09/26/2017 16:16    EKG:   Orders placed or performed during the hospital encounter of 09/26/17  . EKG 12-Lead  . EKG 12-Lead      Management plans discussed with the patient, family and they are in agreement.  CODE STATUS:     Code Status Orders  (From admission, onward)         Start     Ordered   09/27/17 0747  Full code  Continuous     09/27/17 0747        Code Status History    Date Active Date Inactive Code Status Order ID Comments User Context   04/20/2017 0923 04/22/2017 2210 Full Code [id number]  Bettey Costa, MD ED   01/05/2016 0616 01/06/2016 1928 Full Code [id number]  Saundra Shelling, MD Inpatient   08/21/2015 0424 08/22/2015 1626 Full Code [id number]  Harrie Foreman, MD Inpatient    Advance Directive Documentation     Most Recent Value  Type of Advance Directive  Living will  Pre-existing out of facility DNR order (yellow form or pink MOST form)  -  "MOST" Form in Place?  -      TOTAL TIME TAKING CARE OF THIS PATIENT: 45 minutes.    Avel Peace Jadarion Halbig M.D on 09/28/2017 at 10:38 AM  Between 7am to 6pm - Pager - [phone]  After 6pm go to [email or web address] - password [email or web address] [facility]  Office  [phone]  CC: Primary care physician; Rusty Aus, MD   Note: This dictation was prepared with Dragon dictation along with smaller phrase technology. Any transcriptional errors that result from this process are unintentional.

## 2017-09-30 ENCOUNTER — Other Ambulatory Visit: Payer: Self-pay

## 2017-09-30 ENCOUNTER — Emergency Department
Admission: EM | Admit: 2017-09-30 | Discharge: 2017-09-30 | Disposition: A | Discharge status: Home / Self Care | Payer: PPO | Source: Home / Self Care | Attending: Emergency Medicine | Admitting: Emergency Medicine

## 2017-09-30 DIAGNOSIS — R1310 Dysphagia, unspecified: Secondary | ICD-10-CM

## 2017-09-30 DIAGNOSIS — N183 Chronic kidney disease, stage 3 (moderate): Secondary | ICD-10-CM | POA: Diagnosis not present

## 2017-09-30 DIAGNOSIS — Z87891 Personal history of nicotine dependence: Secondary | ICD-10-CM | POA: Diagnosis not present

## 2017-09-30 DIAGNOSIS — J939 Pneumothorax, unspecified: Secondary | ICD-10-CM | POA: Diagnosis not present

## 2017-09-30 DIAGNOSIS — J982 Interstitial emphysema: Secondary | ICD-10-CM | POA: Diagnosis not present

## 2017-09-30 DIAGNOSIS — E43 Unspecified severe protein-calorie malnutrition: Secondary | ICD-10-CM | POA: Diagnosis not present

## 2017-09-30 DIAGNOSIS — Z79899 Other long term (current) drug therapy: Secondary | ICD-10-CM

## 2017-09-30 DIAGNOSIS — Z66 Do not resuscitate: Secondary | ICD-10-CM | POA: Diagnosis not present

## 2017-09-30 DIAGNOSIS — I482 Chronic atrial fibrillation: Secondary | ICD-10-CM | POA: Diagnosis not present

## 2017-09-30 DIAGNOSIS — Z7902 Long term (current) use of antithrombotics/antiplatelets: Secondary | ICD-10-CM | POA: Diagnosis not present

## 2017-09-30 DIAGNOSIS — F0390 Unspecified dementia without behavioral disturbance: Secondary | ICD-10-CM | POA: Diagnosis not present

## 2017-09-30 DIAGNOSIS — K219 Gastro-esophageal reflux disease without esophagitis: Secondary | ICD-10-CM | POA: Diagnosis not present

## 2017-09-30 DIAGNOSIS — Z8673 Personal history of transient ischemic attack (TIA), and cerebral infarction without residual deficits: Secondary | ICD-10-CM | POA: Diagnosis not present

## 2017-09-30 DIAGNOSIS — R070 Pain in throat: Secondary | ICD-10-CM | POA: Diagnosis not present

## 2017-09-30 DIAGNOSIS — I10 Essential (primary) hypertension: Secondary | ICD-10-CM

## 2017-09-30 DIAGNOSIS — R4702 Dysphasia: Secondary | ICD-10-CM | POA: Insufficient documentation

## 2017-09-30 DIAGNOSIS — Z515 Encounter for palliative care: Secondary | ICD-10-CM | POA: Diagnosis not present

## 2017-09-30 DIAGNOSIS — E785 Hyperlipidemia, unspecified: Secondary | ICD-10-CM | POA: Diagnosis not present

## 2017-09-30 DIAGNOSIS — Z888 Allergy status to other drugs, medicaments and biological substances status: Secondary | ICD-10-CM | POA: Diagnosis not present

## 2017-09-30 DIAGNOSIS — F17210 Nicotine dependence, cigarettes, uncomplicated: Secondary | ICD-10-CM

## 2017-09-30 DIAGNOSIS — Z85828 Personal history of other malignant neoplasm of skin: Secondary | ICD-10-CM | POA: Diagnosis not present

## 2017-09-30 DIAGNOSIS — I129 Hypertensive chronic kidney disease with stage 1 through stage 4 chronic kidney disease, or unspecified chronic kidney disease: Secondary | ICD-10-CM | POA: Diagnosis not present

## 2017-09-30 DIAGNOSIS — Z7901 Long term (current) use of anticoagulants: Secondary | ICD-10-CM | POA: Diagnosis not present

## 2017-09-30 DIAGNOSIS — J9601 Acute respiratory failure with hypoxia: Secondary | ICD-10-CM | POA: Diagnosis not present

## 2017-09-30 DIAGNOSIS — Z681 Body mass index (BMI) 19 or less, adult: Secondary | ICD-10-CM | POA: Diagnosis not present

## 2017-09-30 DIAGNOSIS — R079 Chest pain, unspecified: Secondary | ICD-10-CM | POA: Diagnosis not present

## 2017-09-30 DIAGNOSIS — Z886 Allergy status to analgesic agent status: Secondary | ICD-10-CM | POA: Diagnosis not present

## 2017-09-30 DIAGNOSIS — R531 Weakness: Secondary | ICD-10-CM | POA: Diagnosis not present

## 2017-09-30 DIAGNOSIS — Z880 Allergy status to penicillin: Secondary | ICD-10-CM | POA: Diagnosis not present

## 2017-09-30 NOTE — Discharge Instructions (Signed)
Please seek medical attention for any high fevers, chest pain, shortness of breath, change in behavior, persistent vomiting, bloody stool or any other new or concerning symptoms.  

## 2017-09-30 NOTE — ED Notes (Signed)
Family driving pt back to facility; pt wheeled out to car.

## 2017-09-30 NOTE — ED Triage Notes (Signed)
Pt reports difficulty swallowing for "about a week". Sat 96 on room air currently.

## 2017-09-30 NOTE — ED Provider Notes (Signed)
[facility] Emergency Department Provider Note   ____________________________________________   I have reviewed the triage vital signs and the nursing notes.   HISTORY  Chief Complaint Dysphagia   History limited by: Not Limited   HPI Albert Boone is a 84 y.o. male who presents to the emergency department today with further complaints of dysphasia.  Patient was recently admitted to the hospital with this complaint.  Had work-up including a speech therapy evaluation.  It appears that he is continued to have dysphasia.  He also complained of some shortness of breath today however that appears to have resolved.  Patient denies any recent fevers.   Per medical record review patient has a history of recent admission for dysphagia.  Past Medical History:  Diagnosis Date  . A-fib ([place])   . Dementia   . GERD (gastroesophageal reflux disease)   . Hyperlipemia   . Hypertension   . Prostatitis   . Renal disorder   . Stroke [facility])    mini strokes  . TIA (transient ischemic attack)     Patient Active Problem List   Diagnosis Date Noted  . Dysphagia 09/26/2017  . Urinary retention 09/18/2017  . A-fib ([place]) 05/26/2017  . Hyperlipidemia 05/26/2017  . CKD (chronic kidney disease) stage 3, GFR 30-59 ml/min (HCC) 05/26/2017  . BPH associated with nocturia 05/26/2017  . TIA (transient ischemic attack) 04/20/2017  . Seizure ([place]) 01/05/2016  . Sepsis ([place]) 08/21/2015    Past Surgical History:  Procedure Laterality Date  . SKIN CANCER EXCISION     multiple times    Prior to Admission medications   Medication Sig Start Date End Date Taking? Authorizing Provider  apixaban (ELIQUIS) 2.5 MG TABS tablet Take 2.5 mg by mouth daily.  08/23/17   [provider]  atorvastatin (LIPITOR) 10 MG tablet Take 10 mg by mouth daily.    [provider]  clopidogrel (PLAVIX) 75 MG tablet Take 75 mg by mouth daily.    [provider]   diltiazem (CARDIZEM CD) 120 MG 24 hr capsule Take 1 capsule (120 mg total) by mouth daily. 08/19/17 08/19/18  Merlyn Lot, MD  fluticasone (FLONASE) 50 MCG/ACT nasal spray Place 2 sprays into both nostrils at bedtime. 09/28/17   Salary, Avel Peace, MD  loperamide (IMODIUM) 2 MG capsule Take 2 mg by mouth 4 (four) times daily as needed for diarrhea or loose stools.    [provider]  megestrol (MEGACE) 40 MG tablet TAKE ONE TABLET BY MOUTH EVERY DAY 09/09/17   [provider]  Multiple Vitamins-Minerals (MULTIVITAMIN WITH MINERALS) tablet Take 1 tablet by mouth daily.    [provider]  ondansetron (ZOFRAN ODT) 4 MG disintegrating tablet Take 1 tablet (4 mg total) by mouth every 8 (eight) hours as needed for nausea or vomiting. 09/09/17   Carrie Mew, MD  pantoprazole (PROTONIX) 40 MG tablet Take 40 mg by mouth daily.    [provider]  silodosin (RAPAFLO) 4 MG CAPS capsule Take 1 capsule (4 mg total) by mouth daily with breakfast. 09/16/17   Stoioff, Ronda Fairly, MD    Allergies Ace inhibitors; Nsaids; Clopidogrel; Penicillins; and Tramadol  Family History  Problem Relation Age of Onset  . Hypertension Father   . Parkinson's disease Father   . Pleurisy Mother   . Prostate cancer Neg Hx   . Hematuria Neg Hx   . Kidney disease Neg Hx     Social History Social History   Tobacco  Use  . Smoking status: Former Smoker    Types: Cigarettes    Last attempt to quit: 08/18/1966    Years since quitting: 51.1  . Smokeless tobacco: Never Used  Substance Use Topics  . Alcohol use: Yes    Alcohol/week: 4.0 standard drinks    Types: 4 Glasses of wine per week  . Drug use: No    Review of Systems Constitutional: No fever/chills Eyes: No visual changes. ENT: Positive for difficulty swallowing.  Cardiovascular: Denies chest pain. Respiratory: Positive for shortness of breath. Gastrointestinal: No abdominal pain.  No nausea, no vomiting.  No diarrhea.    Genitourinary: Negative for dysuria. Musculoskeletal: Negative for back pain. Skin: Negative for rash. Neurological: Negative for headaches, focal weakness or numbness.  ____________________________________________   PHYSICAL EXAM:  VITAL SIGNS: ED Triage Vitals  Enc Vitals Group     BP 09/30/17 1250 (!) 144/62     Pulse Rate 09/30/17 1250 73     Resp 09/30/17 1250 14     Temp --      Temp src --      SpO2 09/30/17 1247 98 %     Weight 09/30/17 1251 140 lb (63.5 kg)     Height 09/30/17 1251 5\' 8"  (1.727 m)     Head Circumference --      Peak Flow --      Pain Score 09/30/17 1251 0   Constitutional: Awake and alert. No acute distress. Eyes: Conjunctivae are normal.  ENT      Head: Normocephalic and atraumatic.      Nose: No congestion/rhinnorhea.      Mouth/Throat: Mucous membranes are moist.      Neck: No stridor. Hematological/Lymphatic/Immunilogical: No cervical lymphadenopathy. Cardiovascular: Normal rate, regular rhythm.  No murmurs, rubs, or gallops.  Respiratory: Normal respiratory effort without tachypnea nor retractions. Breath sounds are clear and equal bilaterally. No wheezes/rales/rhonchi. Gastrointestinal: Soft and non tender. No rebound. No guarding.  Genitourinary: Deferred Musculoskeletal: Normal range of motion in all extremities.  Neurologic:  Normal speech and language. No gross focal neurologic deficits are appreciated.  Skin:  Skin is warm, dry and intact. No rash noted. Psychiatric: Mood and affect are normal. Speech and behavior are normal. Patient exhibits appropriate insight and judgment.  ____________________________________________    LABS (pertinent positives/negatives)  None  ____________________________________________   EKG  None  ____________________________________________     RADIOLOGY  None  ____________________________________________   PROCEDURES  Procedures  ____________________________________________   INITIAL IMPRESSION / ASSESSMENT AND PLAN / ED COURSE  Pertinent labs & imaging results that were available during my care of the patient were reviewed by me and considered in my medical decision making (see chart for details).   Patient presented to the emergency department today with continued complaint of dysphagia.  Patient had a recent hospitalization evaluation for this complaint.  It appears that nothing significant has changed.  Did discuss diet with the patient.  Discussed importance of continued speech therapy work-up.  In terms of the shortness of breath they offered to perform chest x-ray and blood work however patient family felt comfortable deferring at this time.  ____________________________________________   FINAL CLINICAL IMPRESSION(S) / ED DIAGNOSES  Final diagnoses:  Dysphagia, unspecified type     Note: This dictation was prepared with Dragon dictation. Any transcriptional errors that result from this process are unintentional     Nance Pear, MD 09/30/17 1526

## 2017-10-02 ENCOUNTER — Emergency Department: Payer: PPO

## 2017-10-02 ENCOUNTER — Inpatient Hospital Stay
Admission: EM | Admit: 2017-10-02 | Discharge: 2017-10-04 | DRG: 199 | Disposition: A | Discharge status: Home Health | Payer: PPO | Attending: Internal Medicine | Admitting: Internal Medicine

## 2017-10-02 ENCOUNTER — Other Ambulatory Visit: Payer: Self-pay

## 2017-10-02 DIAGNOSIS — Z7902 Long term (current) use of antithrombotics/antiplatelets: Secondary | ICD-10-CM | POA: Diagnosis not present

## 2017-10-02 DIAGNOSIS — J9601 Acute respiratory failure with hypoxia: Secondary | ICD-10-CM | POA: Diagnosis present

## 2017-10-02 DIAGNOSIS — I129 Hypertensive chronic kidney disease with stage 1 through stage 4 chronic kidney disease, or unspecified chronic kidney disease: Secondary | ICD-10-CM | POA: Diagnosis present

## 2017-10-02 DIAGNOSIS — E785 Hyperlipidemia, unspecified: Secondary | ICD-10-CM | POA: Diagnosis present

## 2017-10-02 DIAGNOSIS — Z888 Allergy status to other drugs, medicaments and biological substances status: Secondary | ICD-10-CM | POA: Diagnosis not present

## 2017-10-02 DIAGNOSIS — Z515 Encounter for palliative care: Secondary | ICD-10-CM | POA: Diagnosis present

## 2017-10-02 DIAGNOSIS — J9600 Acute respiratory failure, unspecified whether with hypoxia or hypercapnia: Secondary | ICD-10-CM | POA: Diagnosis present

## 2017-10-02 DIAGNOSIS — Z66 Do not resuscitate: Secondary | ICD-10-CM | POA: Diagnosis present

## 2017-10-02 DIAGNOSIS — Z87891 Personal history of nicotine dependence: Secondary | ICD-10-CM

## 2017-10-02 DIAGNOSIS — J939 Pneumothorax, unspecified: Secondary | ICD-10-CM | POA: Diagnosis present

## 2017-10-02 DIAGNOSIS — Z85828 Personal history of other malignant neoplasm of skin: Secondary | ICD-10-CM

## 2017-10-02 DIAGNOSIS — Z886 Allergy status to analgesic agent status: Secondary | ICD-10-CM | POA: Diagnosis not present

## 2017-10-02 DIAGNOSIS — Z8673 Personal history of transient ischemic attack (TIA), and cerebral infarction without residual deficits: Secondary | ICD-10-CM | POA: Diagnosis not present

## 2017-10-02 DIAGNOSIS — R1310 Dysphagia, unspecified: Secondary | ICD-10-CM | POA: Diagnosis present

## 2017-10-02 DIAGNOSIS — J982 Interstitial emphysema: Secondary | ICD-10-CM | POA: Diagnosis present

## 2017-10-02 DIAGNOSIS — Z880 Allergy status to penicillin: Secondary | ICD-10-CM | POA: Diagnosis not present

## 2017-10-02 DIAGNOSIS — K219 Gastro-esophageal reflux disease without esophagitis: Secondary | ICD-10-CM | POA: Diagnosis present

## 2017-10-02 DIAGNOSIS — E43 Unspecified severe protein-calorie malnutrition: Secondary | ICD-10-CM | POA: Diagnosis present

## 2017-10-02 DIAGNOSIS — I482 Chronic atrial fibrillation: Secondary | ICD-10-CM | POA: Diagnosis present

## 2017-10-02 DIAGNOSIS — Z79899 Other long term (current) drug therapy: Secondary | ICD-10-CM

## 2017-10-02 DIAGNOSIS — F0390 Unspecified dementia without behavioral disturbance: Secondary | ICD-10-CM | POA: Diagnosis present

## 2017-10-02 DIAGNOSIS — Z7901 Long term (current) use of anticoagulants: Secondary | ICD-10-CM

## 2017-10-02 DIAGNOSIS — Z681 Body mass index (BMI) 19 or less, adult: Secondary | ICD-10-CM | POA: Diagnosis not present

## 2017-10-02 DIAGNOSIS — N183 Chronic kidney disease, stage 3 (moderate): Secondary | ICD-10-CM | POA: Diagnosis present

## 2017-10-02 LAB — CBC WITH DIFFERENTIAL/PLATELET
Basophils Absolute: 0 10*3/uL (ref 0–0.1)
Basophils Relative: 0 %
EOS PCT: 0 %
Eosinophils Absolute: 0 10*3/uL (ref 0–0.7)
HCT: 42 % (ref 40.0–52.0)
Hemoglobin: 14.3 g/dL (ref 13.0–18.0)
LYMPHS ABS: 1 10*3/uL (ref 1.0–3.6)
Lymphocytes Relative: 8 %
MCH: 31.1 pg (ref 26.0–34.0)
MCHC: 34.1 g/dL (ref 32.0–36.0)
MCV: 91.3 fL (ref 80.0–100.0)
MONOS PCT: 8 %
Monocytes Absolute: 1 10*3/uL (ref 0.2–1.0)
NEUTROS ABS: 10.6 10*3/uL — AB (ref 1.4–6.5)
Neutrophils Relative %: 84 %
PLATELETS: 194 10*3/uL (ref 150–440)
RBC: 4.6 MIL/uL (ref 4.40–5.90)
RDW: 14.4 % (ref 11.5–14.5)
WBC: 12.6 10*3/uL — ABNORMAL HIGH (ref 3.8–10.6)

## 2017-10-02 LAB — URINALYSIS, COMPLETE (UACMP) WITH MICROSCOPIC
BILIRUBIN URINE: NEGATIVE
Bacteria, UA: NONE SEEN
GLUCOSE, UA: NEGATIVE mg/dL
HGB URINE DIPSTICK: NEGATIVE
KETONES UR: 20 mg/dL — AB
Leukocytes, UA: NEGATIVE
NITRITE: NEGATIVE
PROTEIN: NEGATIVE mg/dL
Specific Gravity, Urine: 1.014 (ref 1.005–1.030)
WBC UA: NONE SEEN WBC/hpf (ref 0–5)
pH: 5 (ref 5.0–8.0)

## 2017-10-02 LAB — COMPREHENSIVE METABOLIC PANEL
ALT: 14 U/L (ref 0–44)
AST: 18 U/L (ref 15–41)
Albumin: 4 g/dL (ref 3.5–5.0)
Alkaline Phosphatase: 48 U/L (ref 38–126)
Anion gap: 10 (ref 5–15)
BUN: 15 mg/dL (ref 8–23)
CHLORIDE: 108 mmol/L (ref 98–111)
CO2: 24 mmol/L (ref 22–32)
CREATININE: 1.07 mg/dL (ref 0.61–1.24)
Calcium: 9.1 mg/dL (ref 8.9–10.3)
GFR calc Af Amer: >60 mL/min (ref >60)
GFR, EST NON AFRICAN AMERICAN: 58 mL/min — AB (ref >60)
Glucose, Bld: 97 mg/dL (ref 70–99)
Potassium: 3.9 mmol/L (ref 3.5–5.1)
Sodium: 142 mmol/L (ref 135–145)
Total Bilirubin: 1.2 mg/dL (ref 0.3–1.2)
Total Protein: 7.5 g/dL (ref 6.5–8.1)

## 2017-10-02 LAB — TROPONIN I: TROPONIN I: 0.03 ng/mL — AB (ref <0.03)

## 2017-10-02 LAB — BRAIN NATRIURETIC PEPTIDE: B Natriuretic Peptide: 114 pg/mL — ABNORMAL HIGH (ref 0.0–100.0)

## 2017-10-02 LAB — LACTIC ACID, PLASMA
LACTIC ACID, VENOUS: 1.2 mmol/L (ref 0.5–1.9)
LACTIC ACID, VENOUS: 1.4 mmol/L (ref 0.5–1.9)

## 2017-10-02 MED ORDER — ACETAMINOPHEN 10 MG/ML IV SOLN
1000.0000 mg | Freq: Once | INTRAVENOUS | Status: AC
  Administered 2017-10-02: 1000 mg via INTRAVENOUS
  Filled 2017-10-02: qty 100

## 2017-10-02 MED ORDER — METRONIDAZOLE IN NACL 5-0.79 MG/ML-% IV SOLN
500.0000 mg | Freq: Once | INTRAVENOUS | Status: AC
  Administered 2017-10-02: 500 mg via INTRAVENOUS
  Filled 2017-10-02: qty 100

## 2017-10-02 MED ORDER — FLUTICASONE PROPIONATE 50 MCG/ACT NA SUSP
2.0000 | Freq: Every day | NASAL | Status: DC
  Administered 2017-10-02 – 2017-10-03 (×2): 2 via NASAL
  Filled 2017-10-02: qty 16

## 2017-10-02 MED ORDER — ONDANSETRON HCL 4 MG PO TABS: 4.0000 mg | ORAL_TABLET | Freq: Four times a day (QID) | ORAL | Status: DC | PRN

## 2017-10-02 MED ORDER — SODIUM CHLORIDE 0.9 % IV BOLUS
1000.0000 mL | Freq: Once | INTRAVENOUS | Status: AC
  Administered 2017-10-02: 1000 mL via INTRAVENOUS

## 2017-10-02 MED ORDER — FENTANYL CITRATE (PF) 100 MCG/2ML IJ SOLN
25.0000 ug | Freq: Once | INTRAMUSCULAR | Status: AC
  Administered 2017-10-02: 25 ug via INTRAVENOUS
  Filled 2017-10-02: qty 2

## 2017-10-02 MED ORDER — ADULT MULTIVITAMIN W/MINERALS CH
1.0000 | ORAL_TABLET | Freq: Every day | ORAL | Status: DC
  Administered 2017-10-03 – 2017-10-04 (×2): 1 via ORAL
  Filled 2017-10-02 (×3): qty 1

## 2017-10-02 MED ORDER — ATORVASTATIN CALCIUM 10 MG PO TABS
10.0000 mg | ORAL_TABLET | Freq: Every day | ORAL | Status: DC
  Administered 2017-10-02 – 2017-10-03 (×2): 10 mg via ORAL
  Filled 2017-10-02 (×2): qty 1

## 2017-10-02 MED ORDER — TAMSULOSIN HCL 0.4 MG PO CAPS
0.4000 mg | ORAL_CAPSULE | Freq: Every day | ORAL | Status: DC
  Administered 2017-10-02 – 2017-10-04 (×3): 0.4 mg via ORAL
  Filled 2017-10-02 (×3): qty 1

## 2017-10-02 MED ORDER — MEGESTROL ACETATE 20 MG PO TABS
40.0000 mg | ORAL_TABLET | Freq: Every day | ORAL | Status: DC
  Administered 2017-10-02 – 2017-10-04 (×3): 40 mg via ORAL
  Filled 2017-10-02 (×3): qty 2

## 2017-10-02 MED ORDER — ACETAMINOPHEN 650 MG RE SUPP: 650.0000 mg | Freq: Four times a day (QID) | RECTAL | Status: DC | PRN

## 2017-10-02 MED ORDER — DILTIAZEM HCL ER COATED BEADS 120 MG PO CP24
120.0000 mg | ORAL_CAPSULE | Freq: Every day | ORAL | Status: DC
  Administered 2017-10-02 – 2017-10-04 (×3): 120 mg via ORAL
  Filled 2017-10-02 (×3): qty 1

## 2017-10-02 MED ORDER — PANTOPRAZOLE SODIUM 40 MG PO TBEC
40.0000 mg | DELAYED_RELEASE_TABLET | Freq: Every day | ORAL | Status: DC
  Administered 2017-10-02 – 2017-10-04 (×3): 40 mg via ORAL
  Filled 2017-10-02 (×3): qty 1

## 2017-10-02 MED ORDER — CLOPIDOGREL BISULFATE 75 MG PO TABS
75.0000 mg | ORAL_TABLET | Freq: Every day | ORAL | Status: DC
  Administered 2017-10-02 – 2017-10-04 (×3): 75 mg via ORAL
  Filled 2017-10-02 (×3): qty 1

## 2017-10-02 MED ORDER — ACETAMINOPHEN 325 MG PO TABS
650.0000 mg | ORAL_TABLET | Freq: Four times a day (QID) | ORAL | Status: DC | PRN
  Administered 2017-10-02 – 2017-10-03 (×3): 650 mg via ORAL
  Filled 2017-10-02 (×3): qty 2

## 2017-10-02 MED ORDER — ONDANSETRON HCL 4 MG/2ML IJ SOLN: 4.0000 mg | Freq: Four times a day (QID) | INTRAMUSCULAR | Status: DC | PRN

## 2017-10-02 MED ORDER — LOPERAMIDE HCL 2 MG PO CAPS: 2.0000 mg | ORAL_CAPSULE | Freq: Four times a day (QID) | ORAL | Status: DC | PRN

## 2017-10-02 MED ORDER — APIXABAN 2.5 MG PO TABS
2.5000 mg | ORAL_TABLET | Freq: Two times a day (BID) | ORAL | Status: DC
  Administered 2017-10-02 – 2017-10-04 (×4): 2.5 mg via ORAL
  Filled 2017-10-02 (×4): qty 1

## 2017-10-02 MED ORDER — SODIUM CHLORIDE 0.9 % IV SOLN
1.0000 g | Freq: Once | INTRAVENOUS | Status: AC
  Administered 2017-10-02: 1 g via INTRAVENOUS
  Filled 2017-10-02: qty 10

## 2017-10-02 MED ORDER — DOCUSATE SODIUM 100 MG PO CAPS
100.0000 mg | ORAL_CAPSULE | Freq: Two times a day (BID) | ORAL | Status: DC
  Administered 2017-10-02 – 2017-10-04 (×4): 100 mg via ORAL
  Filled 2017-10-02 (×4): qty 1

## 2017-10-02 NOTE — Clinical Social Work Note (Signed)
Clinical Social Work Assessment  Patient Details  Name: Albert Boone MRN: [id number] Date of Birth: 1922-06-05  Date of referral:  10/02/17               Reason for consult:  Other (Comment Required)(From [facility])                Permission sought to share information with:    Permission granted to share information::  Yes, Verbal Permission Granted  Name::     Health care POA daughter Albert Boone [phone]  Agency::  [facility]  Relationship::     Contact Information:     Housing/Transportation Living arrangements for the past 2 months:  Assisted Living Facility([facility]) Source of Information:  Patient, Spouse Patient Interpreter Needed:  None Criminal Activity/Legal Involvement Pertinent to Current Situation/Hospitalization:  No - Comment as needed Significant Relationships:  Adult Children, Siblings, Spouse, Other Family Members Lives with:  Spouse Do you feel safe going back to the place where you live?  Yes Need for family participation in patient care:  Yes (Comment)  Care giving concerns: Wife concerned with chest pain   Social Worker assessment / plan: LCSW introduced myself and completed an assessment. Patient is 84 year old male who is ambulatory uses a cane and walker when needed. He is married for 70 years and resently (2 days ago) moved to [place] with his wife. Daughter is his health care POA. Patient is very independent and he came to hospital today because he has chest pains and leg pain and SOB. He has excellent family involvement. Family plan is for him to return to [place]. 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 Wife is at his side  Emotional Assessment Appearance:  Appears younger than stated age Attitude/Demeanor/Rapport:  Charismatic Affect (typically observed):  Accepting, Adaptable Orientation:  Oriented to Self, Oriented to Place, Oriented to  Time, Oriented to Situation Alcohol / Substance use:  Not Applicable Psych involvement (Current and /or in the community):  No (Comment)  Discharge Needs  Concerns to be addressed:  No discharge needs identified Readmission within the last 30 days:  No Current discharge risk:  None Barriers to Discharge:  Continued Medical Work up   [place], LCSW 10/02/2017, 1:36 PM

## 2017-10-02 NOTE — ED Notes (Signed)
Date and time results received: 10/02/17 1211  Test: Troponin Critical Value: 0.03 ng/mL  Name of Provider Notified: Dr. Mable Paris

## 2017-10-02 NOTE — ED Notes (Signed)
Pt now on 4 lpm via [place] per MD verbal order.

## 2017-10-02 NOTE — ED Triage Notes (Signed)
Pt arrived via ems from [facility] - report of chest pain that started 6am - denies shortness of breath - has hx of CVA

## 2017-10-02 NOTE — NC FL2 (Addendum)
  [place] LEVEL OF CARE SCREENING TOOL     IDENTIFICATION  Patient Name: Albert Boone Birthdate: 1922-06-28 Sex: male Admission Date (Current Location): 10/02/2017  [place] and [place] Number:  Engineering geologist and Address:  [facility], [address] [place], [place] [address]      Provider Number: [id number]  Attending Physician Name and Address:  Nicholes Mango, MD  Relative Name and Phone Number:     HCPOA daughter Gus Puma [phone]  Current Level of Care: Hospital Recommended Level of Care: Other (Comment)([facility]) Prior Approval Number:    Date Approved/Denied:   PASRR Number:   [id number]  Discharge Plan: Assisted Living    Current Diagnoses: Patient Active Problem List   Diagnosis Date Noted  . Acute respiratory failure ([place]) 10/02/2017  . Dysphagia 09/26/2017  . Urinary retention 09/18/2017  . A-fib ([place]) 05/26/2017  . Hyperlipidemia 05/26/2017  . CKD (chronic kidney disease) stage 3, GFR 30-59 ml/min (HCC) 05/26/2017  . BPH associated with nocturia 05/26/2017  . TIA (transient ischemic attack) 04/20/2017  . Seizure ([place]) 01/05/2016  . Sepsis ([place]) 08/21/2015    Orientation RESPIRATION BLADDER Height & Weight     Self, Time, Situation, Place  O2 Continent Weight: 135 lb (61.2 kg) Height:  5\' 8"  (172.7 cm)  BEHAVIORAL SYMPTOMS/MOOD NEUROLOGICAL BOWEL NUTRITION STATUS      Continent Diet(normal)  AMBULATORY STATUS COMMUNICATION OF NEEDS Skin   Independent   Normal                       Personal Care Assistance Level of Assistance  Bathing, Feeding, Dressing, Total care Bathing Assistance: Independent Feeding assistance: Independent Dressing Assistance: Independent Total Care Assistance: Independent   Functional Limitations Info  Sight, Hearing, Speech Sight Info: Adequate(glasses) Hearing Info: Adequate(Hard of hearing) Speech Info: Adequate    SPECIAL CARE FACTORS  FREQUENCY                       Contractures Contractures Info: Not present    Additional Factors Info  Allergies, Code Status Code Status Info: Full code Allergies Info: Ace Inhibitors, Nsaids, Clopidogrel, Penicillins, Tramadol           TAKE these medications   apixaban 2.5 MG Tabs tablet Commonly known as:  ELIQUIS Take 2.5 mg by mouth 2 (two) times daily.   clopidogrel 75 MG tablet Commonly known as:  PLAVIX Take 75 mg by mouth daily.   diltiazem 120 MG 24 hr capsule Commonly known as:  CARDIZEM CD Take 1 capsule (120 mg total) by mouth daily.   fluticasone 50 MCG/ACT nasal spray Commonly known as:  FLONASE Place 2 sprays into both nostrils at bedtime.   megestrol 40 MG tablet Commonly known as:  MEGACE Take 40 mg by mouth daily.   ondansetron 4 MG disintegrating tablet Commonly known as:  ZOFRAN-ODT Take 1 tablet (4 mg total) by mouth every 8 (eight) hours as needed for nausea or vomiting.   pantoprazole 40 MG tablet Commonly known as:  PROTONIX Take 40 mg by mouth daily.   silodosin 4 MG Caps capsule Commonly known as:  RAPAFLO Take 1 capsule (4 mg total) by mouth daily with breakfast.      Discharge Medications: Please see discharge summary for a list of discharge medications.  Relevant Imaging Results:  Relevant Lab Results:   Additional Information SSN [id number]  Joana Reamer, [place]

## 2017-10-02 NOTE — ED Notes (Signed)
Pt transported to 243

## 2017-10-02 NOTE — H&P (Signed)
[place] [facility] [place] NAME: Albert Boone    MR#:  [id number]  DATE OF BIRTH:  1922/03/08  DATE OF ADMISSION:  10/02/2017  PRIMARY CARE PHYSICIAN: Rusty Aus, MD   REQUESTING/REFERRING PHYSICIAN: Rifenbark MD  CHIEF COMPLAINT:   Shortness of breath chest pain HISTORY OF PRESENT ILLNESS:  Albert Boone  is a 84 y.o. male with a known history of chronic atrial fibrillation, on Eliquis, essential hypertension, hyperlipidemia, chronic kidney disease stage III, GERD is brought into the emergency department via EMS from his assisted living facility with left-sided chest pain associated with shortness of breath which started this morning.  Patient took 324 mg of aspirin prior to ER arrival.  Chest x-ray and CT chest has revealed small pneumothorax and small pneumomediastinum with no midline shift.  No tension pneumothorax noticed.  Patient was placed on 15 L of nonrebreather and hospitalist team is called to admit the patient.  With oxygen patient was feeling better.  Denies chest pain during my examination.  Wife at bedside  PAST MEDICAL HISTORY:   Past Medical History:  Diagnosis Date  . A-fib ([place])   . Dementia   . GERD (gastroesophageal reflux disease)   . Hyperlipemia   . Hypertension   . Prostatitis   . Renal disorder   . Stroke [facility])    mini strokes  . TIA (transient ischemic attack)     PAST SURGICAL HISTOIRY:   Past Surgical History:  Procedure Laterality Date  . SKIN CANCER EXCISION     multiple times    SOCIAL HISTORY:   Social History   Tobacco Use  . Smoking status: Former Smoker    Types: Cigarettes    Last attempt to quit: 08/18/1966    Years since quitting: 51.1  . Smokeless tobacco: Never Used  Substance Use Topics  . Alcohol use: Not Currently    Alcohol/week: 4.0 standard drinks    Types: 4 Glasses of wine per week    FAMILY HISTORY:   Family History  Problem Relation Age of Onset  .  Hypertension Father   . Parkinson's disease Father   . Pleurisy Mother   . Prostate cancer Neg Hx   . Hematuria Neg Hx   . Kidney disease Neg Hx     DRUG ALLERGIES:   Allergies  Allergen Reactions  . Ace Inhibitors Other (See Comments)    Renal failure  . Nsaids Other (See Comments)    Renal failure  . Clopidogrel Diarrhea  . Penicillins Rash    Has patient had a PCN reaction causing immediate rash, facial/tongue/throat swelling, SOB or lightheadedness with hypotension: Yes Has patient had a PCN reaction causing severe rash involving mucus membranes or skin necrosis: No Has patient had a PCN reaction that required hospitalization No Has patient had a PCN reaction occurring within the last 10 years: No If all of the above answers are "NO", then may proceed with Cephalosporin use.   . Tramadol Rash    With itching    REVIEW OF SYSTEMS:  CONSTITUTIONAL: No fever, fatigue or weakness.  EYES: No blurred or double vision.  EARS, NOSE, AND THROAT: No tinnitus or ear pain.  RESPIRATORY: No cough, shortness of breath, wheezing or hemoptysis.  CARDIOVASCULAR: No chest pain, orthopnea, edema.  GASTROINTESTINAL: No nausea, vomiting, diarrhea or abdominal pain.  GENITOURINARY: No dysuria, hematuria.  ENDOCRINE: No polyuria, nocturia,  HEMATOLOGY: No anemia, easy bruising or bleeding SKIN: No rash or lesion. MUSCULOSKELETAL:  No joint pain or arthritis.   NEUROLOGIC: No tingling, numbness, weakness.  PSYCHIATRY: No anxiety or depression.   MEDICATIONS AT HOME:   Prior to Admission medications   Medication Sig Start Date End Date Taking? Authorizing Provider  apixaban (ELIQUIS) 2.5 MG TABS tablet Take 2.5 mg by mouth 2 (two) times daily.    Yes [provider]  clopidogrel (PLAVIX) 75 MG tablet Take 75 mg by mouth daily.   Yes [provider]  diltiazem (CARDIZEM CD) 120 MG 24 hr capsule Take 1 capsule (120 mg total) by mouth daily. 08/19/17 08/19/18 Yes Merlyn Lot, MD  fluticasone [facility]) 50 MCG/ACT nasal spray Place 2 sprays into both nostrils at bedtime. 09/28/17  Yes Salary, Avel Peace, MD  megestrol (MEGACE) 40 MG tablet Take 40 mg by mouth daily.    Yes [provider]  pantoprazole (PROTONIX) 40 MG tablet Take 40 mg by mouth daily.   Yes [provider]  silodosin (RAPAFLO) 4 MG CAPS capsule Take 1 capsule (4 mg total) by mouth daily with breakfast. 09/16/17  Yes Stoioff, Ronda Fairly, MD  ondansetron (ZOFRAN ODT) 4 MG disintegrating tablet Take 1 tablet (4 mg total) by mouth every 8 (eight) hours as needed for nausea or vomiting. Patient not taking: Reported on 10/02/2017 09/09/17   Carrie Mew, MD      VITAL SIGNS:  Blood pressure (!) 157/84, pulse 83, temperature 97.6 F (36.4 C), temperature source Oral, resp. rate 18, height 5\' 8"  (1.727 m), weight 61.2 kg, SpO2 100 %.  PHYSICAL EXAMINATION:  GENERAL:  84 y.o.-year-old patient lying in the bed with no acute distress.  EYES: Pupils equal, round, reactive to light and accommodation. No scleral icterus. Extraocular muscles intact.  HEENT: Head atraumatic, normocephalic. Oropharynx and nasopharynx clear.  NECK:  Supple, no jugular venous distention. No thyroid enlargement, no tenderness.  LUNGS: Normal breath sounds bilaterally, no wheezing, rales,rhonchi or crepitation. No use of accessory muscles of respiration.  CARDIOVASCULAR: S1, S2 normal. No murmurs, rubs, or gallops.  ABDOMEN: Soft, nontender, nondistended. Bowel sounds present.  EXTREMITIES: No pedal edema, cyanosis, or clubbing.  NEUROLOGIC: Cranial nerves II through XII are intact. Sensation intact. Gait not checked.  PSYCHIATRIC: The patient is alert and oriented x 3.  SKIN: No obvious rash, lesion, or ulcer.   LABORATORY PANEL:   CBC Recent Labs  Lab 10/02/17 1137  WBC 12.6*  HGB 14.3  HCT 42.0  PLT 194    ------------------------------------------------------------------------------------------------------------------  Chemistries  Recent Labs  Lab 10/02/17 1137  NA 142  K 3.9  CL 108  CO2 24  GLUCOSE 97  BUN 15  CREATININE 1.07  CALCIUM 9.1  AST 18  ALT 14  ALKPHOS 48  BILITOT 1.2   ------------------------------------------------------------------------------------------------------------------  Cardiac Enzymes Recent Labs  Lab 10/02/17 1137  TROPONINI 0.03*   ------------------------------------------------------------------------------------------------------------------  RADIOLOGY:  Ct Chest Wo Contrast  Result Date: 10/02/2017 CLINICAL DATA:  Left chest pain today.  Known pneumothorax. EXAM: CT CHEST WITHOUT CONTRAST TECHNIQUE: Multidetector CT imaging of the chest was performed following the standard protocol without IV contrast. COMPARISON:  Radiographs earlier today and 09/25/2017. Chest CT 12/06/2007. FINDINGS: Cardiovascular: Diffuse atherosclerosis of the aorta, great vessels and coronary arteries. There are probable aortic valvular and mitral annular calcifications. The heart size is stable. There is no significant pericardial effusion. Mediastinum/Nodes: There are no enlarged mediastinal, hilar or axillary lymph nodes.Small mediastinal lymph nodes are stable. There is mild pneumomediastinum without evidence of mediastinal hematoma. There is grossly stable  bilateral thyroid nodularity, measuring up to 1.5 cm inferiorly in the right lobe (image 28/2). No abnormality of the esophagus or trachea demonstrated. Lungs/Pleura: As seen on earlier radiographs, there is a small left sided pneumothorax with apical and anterior components. There is no right pneumothorax, mediastinal shift or significant pleural effusion. There is severe underlying chronic interstitial lung disease with progressive diffuse subpleural reticulation. Honeycomb formation is present at the lung bases,  greater on the left. No confluent airspace opacity or suspicious pulmonary nodule. Upper abdomen: There are calcified gallstones and a small nonobstructing calculus in the upper pole of the right kidney. No acute findings identified. Musculoskeletal/Chest wall: There is no chest wall mass or suspicious osseous finding. IMPRESSION: 1. CT confirms the presence of a small left-sided pneumothorax. No evidence of tension component. 2. Mild pneumomediastinum. No mediastinal fluid collection or inflammation identified. 3. Progressive chronic interstitial lung disease with subpleural reticulation and honeycomb formation at both lung bases from 2009 CT. 4. Coronary and Aortic Atherosclerosis (ICD10-I70.0). Electronically Signed   By: Richardean Sale M.D.   On: 10/02/2017 12:45   Dg Chest Port 1 View  Result Date: 10/02/2017 CLINICAL DATA:  Acute LEFT chest pain today. History of interstitial lung disease. EXAM: PORTABLE CHEST 1 VIEW COMPARISON:  09/25/2017 and prior chest radiographs. 12/06/2007 chest CT FINDINGS: An equivocal tiny LEFT apical and lateral pneumothorax noted. Recommend upright expiration chest radiograph. Cardiomediastinal silhouette is unchanged with UPPER limits normal heart size. Interstitial opacities, LEFT-greater-than-RIGHT, and elevated RIGHT hemidiaphragm are again noted. No large pleural effusion or acute bony abnormality identified. IMPRESSION: Equivocal tiny LEFT apical and lateral pneumothorax. Recommend upright expiration chest radiograph for initial further evaluation. No other acute abnormalities identified.  Chronic interstitial Electronically Signed   By: Margarette Canada M.D.   On: 10/02/2017 11:46    EKG:   Orders placed or performed during the hospital encounter of 10/02/17  . ED EKG  . ED EKG  . ED EKG within 10 minutes  . ED EKG within 10 minutes  . EKG 12-Lead  . EKG 12-Lead  . EKG 12-Lead  . EKG 12-Lead    IMPRESSION AND PLAN:   Acute hypoxic respiratory failure  secondary to pneumothorax and pneumomediastinum; no tension pneumothorax Admit to telemetry Currently on nonrebreather on 15 L of oxygen will wean off as tolerated Patient clinically is feeling better so no chest tube is considered Pulmonology consult is placed discussed with Dr. Bretta Bang Pain management as needed Prophylactic antibiotics Rocephin and Flagyl were given in the ED by the ED physician  Chronic atrial for ablation rate controlled continue home medication Eliquis, Cardizem  Chronic kidney disease stage III Seems to be at his baseline.  Avoid nephrotoxins and renal dose other medications  Hyperlipidemia continue Lipitor  GERD Protonix   All the records are reviewed and case discussed with ED provider. Management plans discussed with the patient, family and they are in agreement.  CODE STATUS: fc , wife Shiela Mayer  TOTAL critical care TIME TAKING CARE OF THIS PATIENT: 43  minutes.   Note: This dictation was prepared with Dragon dictation along with smaller phrase technology. Any transcriptional errors that result from this process are unintentional.  Nicholes Mango M.D on 10/02/2017 at 4:07 PM  Between 7am to 6pm - Pager - [phone]  After 6pm go to [email or web address] - password [email or web address] [place]  Office  [phone]  CC: Primary care physician; Rusty Aus, MD

## 2017-10-02 NOTE — ED Provider Notes (Signed)
[facility] Emergency Department Provider Note  ____________________________________________   First MD Initiated Contact with Patient 10/02/17 1115     (approximate)  I have reviewed the triage vital signs and the nursing notes.   HISTORY  Chief Complaint Chest Pain   HPI Albert Boone is a 84 y.o. male who comes to the emergency department via EMS from his assisted living facility with left-sided chest pain that began this morning.  He took 324 mg of aspirin prior to arrival.   The patient is a complex past medical history including dementia, hypertension, previous strokes, and atrial fibrillation.  He has been admitted to the hospital multiple times recently for dysphasia.  His symptoms seem to come on suddenly this morning at 6 AM and are located on the left lateral chest.  Nonradiating.  Worse with deep inspiration.  He has had increasing cough that is somewhat productive recently.  Pain is not positional.  The pain is currently sharp and moderate severity.   Past Medical History:  Diagnosis Date  . A-fib ([place])   . Dementia   . GERD (gastroesophageal reflux disease)   . Hyperlipemia   . Hypertension   . Prostatitis   . Renal disorder   . Stroke [facility])    mini strokes  . TIA (transient ischemic attack)     Patient Active Problem List   Diagnosis Date Noted  . Dysphagia 09/26/2017  . Urinary retention 09/18/2017  . A-fib ([place]) 05/26/2017  . Hyperlipidemia 05/26/2017  . CKD (chronic kidney disease) stage 3, GFR 30-59 ml/min (HCC) 05/26/2017  . BPH associated with nocturia 05/26/2017  . TIA (transient ischemic attack) 04/20/2017  . Seizure ([place]) 01/05/2016  . Sepsis ([place]) 08/21/2015    Past Surgical History:  Procedure Laterality Date  . SKIN CANCER EXCISION     multiple times    Prior to Admission medications   Medication Sig Start Date End Date Taking? Authorizing Provider  apixaban (ELIQUIS) 2.5 MG TABS tablet Take 2.5 mg  by mouth daily.  08/23/17   [provider]  atorvastatin (LIPITOR) 10 MG tablet Take 10 mg by mouth daily.    [provider]  clopidogrel (PLAVIX) 75 MG tablet Take 75 mg by mouth daily.    [provider]  diltiazem (CARDIZEM CD) 120 MG 24 hr capsule Take 1 capsule (120 mg total) by mouth daily. 08/19/17 08/19/18  Merlyn Lot, MD  fluticasone (FLONASE) 50 MCG/ACT nasal spray Place 2 sprays into both nostrils at bedtime. 09/28/17   Salary, Avel Peace, MD  loperamide (IMODIUM) 2 MG capsule Take 2 mg by mouth 4 (four) times daily as needed for diarrhea or loose stools.    [provider]  megestrol (MEGACE) 40 MG tablet TAKE ONE TABLET BY MOUTH EVERY DAY 09/09/17   [provider]  Multiple Vitamins-Minerals (MULTIVITAMIN WITH MINERALS) tablet Take 1 tablet by mouth daily.    [provider]  ondansetron (ZOFRAN ODT) 4 MG disintegrating tablet Take 1 tablet (4 mg total) by mouth every 8 (eight) hours as needed for nausea or vomiting. 09/09/17   Carrie Mew, MD  pantoprazole (PROTONIX) 40 MG tablet Take 40 mg by mouth daily.    [provider]  silodosin (RAPAFLO) 4 MG CAPS capsule Take 1 capsule (4 mg total) by mouth daily with breakfast. 09/16/17   Stoioff, Ronda Fairly, MD    Allergies Ace inhibitors; Nsaids; Clopidogrel; Penicillins; and Tramadol  Family History  Problem Relation Age of Onset  .  Hypertension Father   . Parkinson's disease Father   . Pleurisy Mother   . Prostate cancer Neg Hx   . Hematuria Neg Hx   . Kidney disease Neg Hx     Social History Social History   Tobacco Use  . Smoking status: Former Smoker    Types: Cigarettes    Last attempt to quit: 08/18/1966    Years since quitting: 51.1  . Smokeless tobacco: Never Used  Substance Use Topics  . Alcohol use: Not Currently    Alcohol/week: 4.0 standard drinks    Types: 4 Glasses of wine per week  . Drug use: No    Review of Systems Constitutional: No  fever/chills Eyes: No visual changes. ENT: No sore throat. Cardiovascular: Positive for chest pain. Respiratory: Positive for shortness of breath. Gastrointestinal: No abdominal pain.  No nausea, no vomiting.  No diarrhea.  No constipation. Genitourinary: Positive for urinary incontinence Musculoskeletal: Negative for back pain. Skin: Negative for rash. Neurological: Negative for headaches, focal weakness or numbness.   ____________________________________________   PHYSICAL EXAM:  VITAL SIGNS: ED Triage Vitals [10/02/17 1114]  Enc Vitals Group     BP      Pulse      Resp      Temp      Temp src      SpO2      Weight      Height      Head Circumference      Peak Flow      Pain Score 8     Pain Loc      Pain Edu?      Excl. in [place]?     Constitutional: Appears obviously uncomfortable sitting upright with slightly increased respiratory rate and grimacing holding his left chest Eyes: PERRL EOMI. Head: Atraumatic. Nose: No congestion/rhinnorhea. Mouth/Throat: No trismus Neck: No stridor.   Cardiovascular: Tachycardic rate, regular rhythm. Grossly normal heart sounds.  Good peripheral circulation. Respiratory: Taking somewhat shallow frequent breaths.  Chest wall tender on the left lateral crackles throughout all the lung sounds equal bilaterally Gastrointestinal: Soft nontender Musculoskeletal: No lower extremity edema   Neurologic:   No gross focal neurologic deficits are appreciated. Skin: Patient appears to have psoriasis diffusely in multiple patches across his chest Psychiatric: Mood and affect are normal. Speech and behavior are normal.    ____________________________________________   DIFFERENTIAL includes but not limited to  Pneumonia, pulmonary embolism, aspiration, acute coronary syndrome ____________________________________________   LABS (all labs ordered are listed, but only abnormal results are displayed)  Labs Reviewed  COMPREHENSIVE METABOLIC  PANEL - Abnormal; Notable for the following components:      Result Value   GFR calc non Af Amer 58 (*)    All other components within normal limits  TROPONIN I - Abnormal; Notable for the following components:   Troponin I 0.03 (*)    All other components within normal limits  CBC WITH DIFFERENTIAL/PLATELET - Abnormal; Notable for the following components:   WBC 12.6 (*)    Neutro Abs 10.6 (*)    All other components within normal limits  BLOOD GAS, VENOUS - Abnormal; Notable for the following components:   pCO2, Ven 38 (*)    All other components within normal limits  LACTIC ACID, PLASMA  BRAIN NATRIURETIC PEPTIDE  LACTIC ACID, PLASMA    Work reviewed by me with slightly elevated white count which is nonspecific.  Elevated troponin likely secondary to demand and not true primary myocardial ischemia __________________________________________  EKG  ED ECG REPORT I, Darel Hong, the attending physician, personally viewed and interpreted this ECG.  Date: 10/02/2017 EKG Time:  Rate: 110 Rhythm: Sinus tachycardia QRS Axis: Rightward axis Intervals: normal ST/T Wave abnormalities: Bifascicular block with normal repolarization abnormalities.  Unchanged from previous EKG 6 days ago Narrative Interpretation: no evidence of acute ischemia  ____________________________________________  RADIOLOGY  Chest x-ray reviewed by me consistent with small left-sided pneumothorax CT scan of the chest reviewed by me shows small left-sided pneumothorax along with pneumomediastinum ____________________________________________   PROCEDURES  Procedure(s) performed: no  .Critical Care Performed by: Darel Hong, MD Authorized by: Darel Hong, MD   Critical care provider statement:    Critical care time (minutes):  30   Critical care time was exclusive of:  Separately billable procedures and treating other patients   Critical care was necessary to treat or prevent imminent or  life-threatening deterioration of the following conditions:  Respiratory failure   Critical care was time spent personally by me on the following activities:  Development of treatment plan with patient or surrogate, discussions with consultants, evaluation of patient's response to treatment, examination of patient, obtaining history from patient or surrogate, ordering and performing treatments and interventions, ordering and review of laboratory studies, ordering and review of radiographic studies, pulse oximetry, re-evaluation of patient's condition and review of old charts    Critical Care performed: Yes  ____________________________________________   INITIAL IMPRESSION / ASSESSMENT AND PLAN / ED COURSE  Pertinent labs & imaging results that were available during my care of the patient were reviewed by me and considered in my medical decision making (see chart for details).   As part of my medical decision making, I reviewed the following data within the [place] History obtained from family if available, nursing notes, old chart and ekg, as well as notes from prior ED visits.  The patient arrives uncomfortable appearing tachycardic with elevated respiratory rate and crackles throughout.  Differential is broad but I am concerned for aspiration versus pneumonia etc.  Broad labs including a lactic acid and chest x-ray are pending.  25 mcg of IV fentanyl and 1 g of IV Tylenol for pain control as the patient cannot tolerate nonsteroidals.    ----------------------------------------- 11:52 AM on 10/02/2017 -----------------------------------------  The patient's chest x-ray is being read as tiny left-sided pneumothorax.  He is saturating 96% on room air at this point.  I placed on 15 L of nonrebreather and will send him for CT scan noncontrast to further evaluate.  No indication for thoracostomy at this  point.  ____________________________________________ ----------------------------------------- 12:15 PM on 10/02/2017 -----------------------------------------  My review of the patient's CT scan shows a small pneumothorax on the left along with some pneumomediastinum.  He is allergic to penicillin so I will cover him with ceftriaxone and Flagyl for anaerobic coverage and he will require inpatient admission.  I spoke with the hospitalist who has graciously agreed to admit the patient to her service.  His respiratory status remained stable at this point.  FINAL CLINICAL IMPRESSION(S) / ED DIAGNOSES  Final diagnoses:  Pneumothorax, unspecified type  Pneumomediastinum ([place])      NEW MEDICATIONS STARTED DURING THIS VISIT:  New Prescriptions   No medications on file     Note:  This document was prepared using Dragon voice recognition software and may include unintentional dictation errors.     Darel Hong, MD 10/02/17 1221

## 2017-10-02 NOTE — ED Notes (Signed)
Daughter and POA, Knox Royalty, wanted to place number in chart- [phone]. She will be notified of room assignment when given.

## 2017-10-02 NOTE — ED Notes (Addendum)
CT notified of fact that pt has a pneumothorax and orders were changed to without contrast - pt will be the next that they get Pt placed on 15L of O2 via non-rebreather by Dr Mable Paris

## 2017-10-03 DIAGNOSIS — J9601 Acute respiratory failure with hypoxia: Secondary | ICD-10-CM

## 2017-10-03 DIAGNOSIS — E43 Unspecified severe protein-calorie malnutrition: Secondary | ICD-10-CM

## 2017-10-03 LAB — COMPREHENSIVE METABOLIC PANEL
ALT: 10 U/L (ref 0–44)
ANION GAP: 5 (ref 5–15)
AST: 17 U/L (ref 15–41)
Albumin: 3.3 g/dL — ABNORMAL LOW (ref 3.5–5.0)
Alkaline Phosphatase: 42 U/L (ref 38–126)
BILIRUBIN TOTAL: 0.9 mg/dL (ref 0.3–1.2)
BUN: 14 mg/dL (ref 8–23)
CHLORIDE: 111 mmol/L (ref 98–111)
CO2: 26 mmol/L (ref 22–32)
Calcium: 8.4 mg/dL — ABNORMAL LOW (ref 8.9–10.3)
Creatinine, Ser: 0.95 mg/dL (ref 0.61–1.24)
GFR calc Af Amer: >60 mL/min (ref >60)
Glucose, Bld: 119 mg/dL — ABNORMAL HIGH (ref 70–99)
POTASSIUM: 3.5 mmol/L (ref 3.5–5.1)
Sodium: 142 mmol/L (ref 135–145)
TOTAL PROTEIN: 6.3 g/dL — AB (ref 6.5–8.1)

## 2017-10-03 LAB — BLOOD GAS, VENOUS
PH VEN: 7.4 (ref 7.250–7.430)
Patient temperature: 37
pCO2, Ven: 38 mmHg — ABNORMAL LOW (ref 44.0–60.0)

## 2017-10-03 LAB — CBC
HCT: 36.5 % — ABNORMAL LOW (ref 40.0–52.0)
HEMOGLOBIN: 12.5 g/dL — AB (ref 13.0–18.0)
MCH: 31.4 pg (ref 26.0–34.0)
MCHC: 34.4 g/dL (ref 32.0–36.0)
MCV: 91.4 fL (ref 80.0–100.0)
Platelets: 178 10*3/uL (ref 150–440)
RBC: 3.99 MIL/uL — AB (ref 4.40–5.90)
RDW: 14.2 % (ref 11.5–14.5)
WBC: 8.4 10*3/uL (ref 3.8–10.6)

## 2017-10-03 MED ORDER — NEPRO/CARBSTEADY PO LIQD
237.0000 mL | Freq: Two times a day (BID) | ORAL | Status: DC
  Administered 2017-10-03 – 2017-10-04 (×2): 237 mL via ORAL

## 2017-10-03 MED ORDER — GUAIFENESIN-DM 100-10 MG/5ML PO SYRP
5.0000 mL | ORAL_SOLUTION | ORAL | Status: DC | PRN
  Administered 2017-10-03: 5 mL via ORAL
  Filled 2017-10-03: qty 5

## 2017-10-03 NOTE — Evaluation (Signed)
Physical Therapy Evaluation Patient Details Name: Albert Boone MRN: [id number] DOB: [birth year] Today's Date: 10/03/2017   History of Present Illness  84 y.o. male with a known history of trouble swallowing.  He came to the ER yesterday 09/25/17 and had trouble swallowing.  He was then able to swallow and was sent home.  Same thing happened this morning where he was unable to swallow very well.  He tried taking some [place] and actually vomited that up.  Hard for him to take pills. Pt has had a lot of mucus and postnasal drip and runny nose.  Hospitalist services were contacted for further evaluation because of his trouble swallowing. Pt has PMH of A-fib, dementia, GERD, HTN, CVA, TIA, CKD stage III, and macular degeneration.  Clinical Impression  Patient is a 84 year old male who lives in an ALF and ambulates with a SPC at baseline.  He was able to perform bed mobility with min A and sat at EOB with fair balance.  He presented with generalized weakness of UE/LE's and did not respond when asked if he has N/T in his feet.  Pt is very Power and presented with confusion throughout evaluation, though he is oriented to self and date.  Pt was able to stand from bedside with only supervision for use of RW.  He completed 40 ft of ambulation with RW and reported fatigue at 15 ft.  Pt presents with posture and gait deviations that indicate fall risk.  Pt will continue to benefit from skilled PT with focus on strength, tolerance to activity, safe use of RW and HEP.    Follow Up Recommendations Home health PT;Supervision for mobility/OOB    Equipment Recommendations  None recommended by PT    Recommendations for Other Services       Precautions / Restrictions Precautions Precautions: Fall Restrictions Weight Bearing Restrictions: No      Mobility  Bed Mobility Overal bed mobility: Needs Assistance Bed Mobility: Supine to Sit     Supine to sit: HOB elevated;Min assist     General bed  mobility comments: Pt initiated sitting up in bed and requested hand held assist to complete transfer.  Transfers Overall transfer level: Needs assistance Equipment used: Rolling walker (2 wheeled) Transfers: Sit to/from Stand Sit to Stand: Supervision         General transfer comment: VC's provided to manage RW; pt able to rise without assistance.  Ambulation/Gait Ambulation/Gait assistance: Min guard Gait Distance (Feet): 40 Feet Assistive device: Rolling walker (2 wheeled)     Gait velocity interpretation: <1.8 ft/sec, indicate of risk for recurrent falls General Gait Details: VC's provided to manage RW, pt kyphotic with decreased step length and foot clearance.  Reported fatigue at 15 ft and was able to return to recliner without rest break.  Stairs            Wheelchair Mobility    Modified Rankin (Stroke Patients Only)       Balance Overall balance assessment: History of Falls;Needs assistance Sitting-balance support: Feet supported;No upper extremity supported Sitting balance-Leahy Scale: Fair Sitting balance - Comments: CGA for sitting balance, VC for post lean which pt quickly corrected and could maintain   Standing balance support: Bilateral upper extremity supported Standing balance-Leahy Scale: Fair Standing balance comment: CGA for standing balance                             Pertinent Vitals/Pain Pain Assessment:  No/denies pain    Home Living Family/patient expects to be discharged to:: Assisted living Living Arrangements: Spouse/significant other Available Help at Discharge: Available PRN/intermittently Type of Home: Assisted living Home Access: Level entry     Home Layout: One level Home Equipment: Walker - 2 wheels;Cane - single point      Prior Function Level of Independence: Independent with assistive device(s)   Gait / Transfers Assistance Needed: Pt stated that he has been walking with a SPC for household ambulation.            Hand Dominance        Extremity/Trunk Assessment   Upper Extremity Assessment Upper Extremity Assessment: Generalized weakness(Grossly 3+/5 bilaterally.)    Lower Extremity Assessment Lower Extremity Assessment: Generalized weakness(Grossly 4-/5 bilaterally.)    Cervical / Trunk Assessment Cervical / Trunk Assessment: Kyphotic  Communication   Communication: HOH  Cognition Arousal/Alertness: Awake/alert Behavior During Therapy: WFL for tasks assessed/performed Overall Cognitive Status: History of cognitive impairments - at baseline                                 General Comments: Pt follows simple commands well (HOH).      General Comments      Exercises     Assessment/Plan    PT Assessment Patient needs continued PT services  PT Problem List Decreased strength;Decreased activity tolerance;Decreased balance;Decreased mobility;Decreased safety awareness       PT Treatment Interventions Gait training;Therapeutic exercise;Therapeutic activities;Balance training;Functional mobility training;Cognitive remediation;Patient/family education    PT Goals (Current goals can be found in the Care Plan section)  Acute Rehab PT Goals Patient Stated Goal: To resume household ambulation as much as possible. PT Goal Formulation: With patient Time For Goal Achievement: 10/17/17 Potential to Achieve Goals: Good    Frequency Min 2X/week   Barriers to discharge        Co-evaluation               AM-PAC PT "6 Clicks" Daily Activity  Outcome Measure Difficulty turning over in bed (including adjusting bedclothes, sheets and blankets)?: A Little Difficulty moving from lying on back to sitting on the side of the bed? : A Lot Difficulty sitting down on and standing up from a chair with arms (e.g., wheelchair, bedside commode, etc,.)?: A Little Help needed moving to and from a bed to chair (including a wheelchair)?: A Little Help needed walking in  hospital room?: A Little Help needed climbing 3-5 steps with a railing? : A Lot 6 Click Score: 16    End of Session Equipment Utilized During Treatment: Gait belt Activity Tolerance: Patient tolerated treatment well Patient left: in chair;with call bell/phone within reach;with chair alarm set;with family/visitor present Nurse Communication: Mobility status(Pt's HR monitor read 130 BPM following ambulation and Vtach message at top of screen. RN notified.  Pt asymptomatic.) PT Visit Diagnosis: Unsteadiness on feet (R26.81);Muscle weakness (generalized) (M62.81);History of falling (Z91.81);Other abnormalities of gait and mobility (R26.89)    Time: 8466-5993 PT Time Calculation (min) (ACUTE ONLY): 17 min   Charges:   PT Evaluation $PT Eval Low Complexity: 1 Low          Roxanne Gates, PT, DPT   Roxanne Gates 10/03/2017, 3:00 PM

## 2017-10-03 NOTE — Consult Note (Signed)
[facility] Consultation      Assessment and Plan:  Acute left apical tiny pneumothorax. Small pneumomediastinum. Dysphagia with likely aspiration. Pulmonary fibrosis with severe fibrotic changes of the left lower lobe, which may be related to aspiration.  - Can continue dysphasia diet. - The patient's pneumomediastinum and pneumothorax are very small and do not require acute intervention.  However I suspect that they may be secondary to coughing associated with dysphasia and aspiration.  The patient's left lower lobe fibrosis may also be related to chronic aspiration. - Given his constellation of symptoms as well as his poor p.o. intake, would recommend feeding tube if that is in line with the patient's and family's wishes.  Otherwise given the patient's numerous ED visits over the last few months and his advanced age would recommend hospice.   Date: 10/03/2017  MRN# [id number] RISHARD DELANGE III [birth year]   Patient is seen at the request of Dr. Margaretmary Eddy for pneumomediastinum.  ZYION DOXTATER III is a 84 y.o. old male seen in consultation for chief complaint of:    Chief Complaint  Patient presents with  . Chest Pain    HPI:   The patient is a 84 year old male with a history of atrial fibrillation, dementia, hypertension, stroke the patient has presented numerous times to the ED past 2 months with various issues including difficulty with urination nervousness/anxiety, atrial fibrillation, dehydration dysphasia on 09/25/2017.  He then returned the following day with recurrent symptoms and then admitted to the hospital.  He was subsequently discharged on 9/18 with a dysphagia 2 diet.  He returned to the hospital with the same complaint on 9/20 declined further work-up, then presented back to the ED on 9/22 with acute chest pain.  Work-up showed small left pneumothorax and pneumomediastinum. Laboratory studies initially showed mild leukocytosis with WBC count  of 12.6, absolute eosinophil count is 0, CO2 level is normal, metabolic panel was otherwise within normal limits.  Patient is currently on room air with an oxygen saturation of 95%.  Imaging personally reviewed, CT chest 10/02/2017>> changes of pulmonary fibrosis, with bibasilar reticular changes, there is a tiny left apical pneumothorax.  Fibrotic changes are greatest in the left base and have advanced significantly when compared with previous images from 12/06/2007.   PMHX:   Past Medical History:  Diagnosis Date  . A-fib ([place])   . Dementia   . GERD (gastroesophageal reflux disease)   . Hyperlipemia   . Hypertension   . Prostatitis   . Renal disorder   . Stroke [facility])    mini strokes  . TIA (transient ischemic attack)    Surgical Hx:  Past Surgical History:  Procedure Laterality Date  . SKIN CANCER EXCISION     multiple times   Family Hx:  Family History  Problem Relation Age of Onset  . Hypertension Father   . Parkinson's disease Father   . Pleurisy Mother   . Prostate cancer Neg Hx   . Hematuria Neg Hx   . Kidney disease Neg Hx    Social Hx:   Social History   Tobacco Use  . Smoking status: Former Smoker    Types: Cigarettes    Last attempt to quit: 08/18/1966    Years since quitting: 51.1  . Smokeless tobacco: Never Used  Substance Use Topics  . Alcohol use: Not Currently    Alcohol/week: 4.0 standard drinks    Types: 4 Glasses of wine per week  . Drug use: No  Medication:    Current Facility-Administered Medications:  .  acetaminophen (TYLENOL) tablet 650 mg, 650 mg, Oral, Q6H PRN, 650 mg at 10/03/17 0752 **OR** acetaminophen (TYLENOL) suppository 650 mg, 650 mg, Rectal, Q6H PRN, Gouru, Aruna, MD .  apixaban (ELIQUIS) tablet 2.5 mg, 2.5 mg, Oral, BID, Gouru, Aruna, MD, 2.5 mg at 10/03/17 0914 .  atorvastatin (LIPITOR) tablet 10 mg, 10 mg, Oral, q1800, Gouru, Aruna, MD, 10 mg at 10/02/17 2225 .  clopidogrel (PLAVIX) tablet 75 mg, 75 mg, Oral, Daily, Gouru,  Aruna, MD, 75 mg at 10/03/17 0914 .  diltiazem (CARDIZEM CD) 24 hr capsule 120 mg, 120 mg, Oral, Daily, Gouru, Aruna, MD, 120 mg at 10/03/17 0914 .  docusate sodium (COLACE) capsule 100 mg, 100 mg, Oral, BID, Gouru, Aruna, MD, 100 mg at 10/03/17 0914 .  feeding supplement (NEPRO CARB STEADY) liquid 237 mL, 237 mL, Oral, BID BM, Demetrios Loll, MD, 237 mL at 10/03/17 1030 .  fluticasone (FLONASE) 50 MCG/ACT nasal spray 2 spray, 2 spray, Each Nare, QHS, Gouru, Aruna, MD, 2 spray at 10/02/17 2224 .  guaiFENesin-dextromethorphan (ROBITUSSIN DM) 100-10 MG/5ML syrup 5 mL, 5 mL, Oral, Q4H PRN, Demetrios Loll, MD, 5 mL at 10/03/17 1223 .  loperamide (IMODIUM) capsule 2 mg, 2 mg, Oral, QID PRN, Gouru, Aruna, MD .  megestrol (MEGACE) tablet 40 mg, 40 mg, Oral, Daily, Gouru, Aruna, MD, 40 mg at 10/03/17 0914 .  multivitamin with minerals tablet 1 tablet, 1 tablet, Oral, Daily, Gouru, Aruna, MD, 1 tablet at 10/03/17 0914 .  ondansetron (ZOFRAN) tablet 4 mg, 4 mg, Oral, Q6H PRN **OR** ondansetron (ZOFRAN) injection 4 mg, 4 mg, Intravenous, Q6H PRN, Gouru, Aruna, MD .  pantoprazole (PROTONIX) EC tablet 40 mg, 40 mg, Oral, Daily, Gouru, Aruna, MD, 40 mg at 10/03/17 0914 .  tamsulosin (FLOMAX) capsule 0.4 mg, 0.4 mg, Oral, Daily, Gouru, Aruna, MD, 0.4 mg at 10/03/17 0914   Allergies:  Ace inhibitors; Nsaids; Clopidogrel; Penicillins; and Tramadol  Review of Systems: Gen:  Denies  fever, sweats, chills HEENT: Denies blurred vision, double vision. bleeds, sore throat Cvc:  No dizziness, chest pain. Resp:   Denies cough or sputum production, shortness of breath Gi: Denies swallowing difficulty, stomach pain. Gu:  Denies bladder incontinence, burning urine Ext:   No Joint pain, stiffness. Skin: No skin rash,  hives  Endoc:  No polyuria, polydipsia. Psych: No depression, insomnia. Other:  All other systems were reviewed with the patient and were negative other that what is mentioned in the HPI.   Physical  Examination:   VS: BP 138/77 (BP Location: Left Arm)   Pulse 74   Temp 98.5 F (36.9 C) (Oral)   Resp 19   Ht 5\' 8"  (1.727 m)   Wt 55.5 kg   SpO2 95%   BMI 18.60 kg/m   General Appearance: No distress  Neuro:without focal findings,  speech normal,  HEENT: PERRLA, EOM intact.   Pulmonary: normal breath sounds, No wheezing.  CardiovascularNormal S1,S2.  No m/r/g.   Abdomen: Benign, Soft, non-tender. Renal:  No costovertebral tenderness  GU:  No performed at this time. Endoc: No evident thyromegaly, no signs of acromegaly. Skin:   warm, no rashes, no ecchymosis  Extremities: normal, no cyanosis, clubbing.  Other findings:    LABORATORY PANEL:   CBC Recent Labs  Lab 10/03/17 0509  WBC 8.4  HGB 12.5*  HCT 36.5*  PLT 178   ------------------------------------------------------------------------------------------------------------------  Chemistries  Recent Labs  Lab 10/03/17 0509  NA 142  K 3.5  CL 111  CO2 26  GLUCOSE 119*  BUN 14  CREATININE 0.95  CALCIUM 8.4*  AST 17  ALT 10  ALKPHOS 42  BILITOT 0.9   ------------------------------------------------------------------------------------------------------------------  Cardiac Enzymes Recent Labs  Lab 10/02/17 1137  TROPONINI 0.03*   ------------------------------------------------------------  RADIOLOGY:  Ct Chest Wo Contrast  Result Date: 10/02/2017 CLINICAL DATA:  Left chest pain today.  Known pneumothorax. EXAM: CT CHEST WITHOUT CONTRAST TECHNIQUE: Multidetector CT imaging of the chest was performed following the standard protocol without IV contrast. COMPARISON:  Radiographs earlier today and 09/25/2017. Chest CT 12/06/2007. FINDINGS: Cardiovascular: Diffuse atherosclerosis of the aorta, great vessels and coronary arteries. There are probable aortic valvular and mitral annular calcifications. The heart size is stable. There is no significant pericardial effusion. Mediastinum/Nodes: There are no  enlarged mediastinal, hilar or axillary lymph nodes.Small mediastinal lymph nodes are stable. There is mild pneumomediastinum without evidence of mediastinal hematoma. There is grossly stable bilateral thyroid nodularity, measuring up to 1.5 cm inferiorly in the right lobe (image 28/2). No abnormality of the esophagus or trachea demonstrated. Lungs/Pleura: As seen on earlier radiographs, there is a small left sided pneumothorax with apical and anterior components. There is no right pneumothorax, mediastinal shift or significant pleural effusion. There is severe underlying chronic interstitial lung disease with progressive diffuse subpleural reticulation. Honeycomb formation is present at the lung bases, greater on the left. No confluent airspace opacity or suspicious pulmonary nodule. Upper abdomen: There are calcified gallstones and a small nonobstructing calculus in the upper pole of the right kidney. No acute findings identified. Musculoskeletal/Chest wall: There is no chest wall mass or suspicious osseous finding. IMPRESSION: 1. CT confirms the presence of a small left-sided pneumothorax. No evidence of tension component. 2. Mild pneumomediastinum. No mediastinal fluid collection or inflammation identified. 3. Progressive chronic interstitial lung disease with subpleural reticulation and honeycomb formation at both lung bases from 2009 CT. 4. Coronary and Aortic Atherosclerosis (ICD10-I70.0). Electronically Signed   By: Richardean Sale M.D.   On: 10/02/2017 12:45   Dg Chest Port 1 View  Result Date: 10/02/2017 CLINICAL DATA:  Acute LEFT chest pain today. History of interstitial lung disease. EXAM: PORTABLE CHEST 1 VIEW COMPARISON:  09/25/2017 and prior chest radiographs. 12/06/2007 chest CT FINDINGS: An equivocal tiny LEFT apical and lateral pneumothorax noted. Recommend upright expiration chest radiograph. Cardiomediastinal silhouette is unchanged with UPPER limits normal heart size. Interstitial opacities,  LEFT-greater-than-RIGHT, and elevated RIGHT hemidiaphragm are again noted. No large pleural effusion or acute bony abnormality identified. IMPRESSION: Equivocal tiny LEFT apical and lateral pneumothorax. Recommend upright expiration chest radiograph for initial further evaluation. No other acute abnormalities identified.  Chronic interstitial Electronically Signed   By: Margarette Canada M.D.   On: 10/02/2017 11:46       Thank  you for the consultation and for allowing [facility] to assist in the care of your patient. Our recommendations are noted above.  Please contact us if we can be of further service.   Marda Stalker, M.D., F.C.C.P.  Board Certified in Internal Medicine, Pulmonary Medicine, [place], and Sleep Medicine.  [facility] Office Number: [phone]   10/03/2017

## 2017-10-03 NOTE — Progress Notes (Signed)
[place] [facility] [place] NAME: Albert Boone    MR#:  [id number]  DATE OF BIRTH:  [birth year]  SUBJECTIVE:  CHIEF COMPLAINT:   Chief Complaint  Patient presents with  . Chest Pain   The patient has generalized weakness and left chest pain. REVIEW OF SYSTEMS:  Review of Systems  Constitutional: Positive for malaise/fatigue. Negative for chills and fever.  HENT: Negative for sore throat.   Eyes: Negative for blurred vision and double vision.  Respiratory: Negative for cough, hemoptysis, shortness of breath, wheezing and stridor.   Cardiovascular: Positive for chest pain. Negative for palpitations, orthopnea and leg swelling.  Gastrointestinal: Negative for abdominal pain, blood in stool, diarrhea, melena, nausea and vomiting.  Genitourinary: Negative for dysuria, flank pain and hematuria.  Musculoskeletal: Negative for back pain and joint pain.  Neurological: Negative for dizziness, sensory change, focal weakness, seizures, loss of consciousness, weakness and headaches.  Endo/Heme/Allergies: Negative for polydipsia.    DRUG ALLERGIES:   Allergies  Allergen Reactions  . Ace Inhibitors Other (See Comments)    Renal failure  . Nsaids Other (See Comments)    Renal failure  . Clopidogrel Diarrhea  . Penicillins Rash    Has patient had a PCN reaction causing immediate rash, facial/tongue/throat swelling, SOB or lightheadedness with hypotension: Yes Has patient had a PCN reaction causing severe rash involving mucus membranes or skin necrosis: No Has patient had a PCN reaction that required hospitalization No Has patient had a PCN reaction occurring within the last 10 years: No If all of the above answers are "NO", then may proceed with Cephalosporin use.   . Tramadol Rash    With itching   VITALS:  Blood pressure 122/68, pulse 82, temperature 98.4 F (36.9 C), resp. rate 18, height 5\' 8"  (1.727 m), weight 55.5 kg, SpO2 97  %. PHYSICAL EXAMINATION:  Physical Exam  Constitutional: He is oriented to person, place, and time. No distress.  HENT:  Head: Normocephalic.  Mouth/Throat: Oropharynx is clear and moist.  Eyes: Pupils are equal, round, and reactive to light. Conjunctivae and EOM are normal. No scleral icterus.  Neck: Normal range of motion. Neck supple. No JVD present. No tracheal deviation present.  Cardiovascular: Normal rate, regular rhythm and normal heart sounds. Exam reveals no gallop.  No murmur heard. Pulmonary/Chest: Effort normal and breath sounds normal. No respiratory distress. He has no wheezes. He has no rales.  Abdominal: Soft. Bowel sounds are normal. He exhibits no distension. There is no tenderness. There is no rebound.  Musculoskeletal: Normal range of motion. He exhibits no edema or tenderness.  Neurological: He is alert and oriented to person, place, and time. No cranial nerve deficit.  Skin: No rash noted. No erythema.  Psychiatric:  Looks demented.   LABORATORY PANEL:  Male CBC Recent Labs  Lab 10/03/17 0509  WBC 8.4  HGB 12.5*  HCT 36.5*  PLT 178   ------------------------------------------------------------------------------------------------------------------ Chemistries  Recent Labs  Lab 10/03/17 0509  NA 142  K 3.5  CL 111  CO2 26  GLUCOSE 119*  BUN 14  CREATININE 0.95  CALCIUM 8.4*  AST 17  ALT 10  ALKPHOS 42  BILITOT 0.9   RADIOLOGY:  No results found. ASSESSMENT AND PLAN:   Acute hypoxic respiratory failure secondary to pneumothorax and pneumomediastinum; no tension pneumothorax He was on nonrebreather on 15 L of oxygen, wean off this am. Pain management as needed Prophylactic antibiotics Rocephin and Flagyl were given in the  ED by the ED physician. The patient's pneumomediastinum and pneumothorax are very small and do not require acute intervention; would recommend feeding tube if that is in line with the patient's and family's wishes per Dr.  Felicie Morn.  Chronic atrial for ablation rate controlled continue home medication Eliquis, Cardizem  Chronic kidney disease stage III Seems to be at his baseline.  Avoid nephrotoxins and renal dose other medications  Hyperlipidemia continue Lipitor  GERD Protonix  Dysphagia.  Dysphagia 3 diet.  Aspiration precaution.  Poor prognosis, palliative care consult.  Generalized weakness.  PT evaluation suggest home health and PT.  All the records are reviewed and case discussed with Care Management/Social Worker. Management plans discussed with the patient, his daughter and other family members and they are in agreement.  CODE STATUS: DNR  TOTAL TIME TAKING CARE OF THIS PATIENT: 30 minutes.   More than 50% of the time was spent in counseling/coordination of care: YES  POSSIBLE D/C IN 1 DAYS, DEPENDING ON CLINICAL CONDITION.   Demetrios Loll M.D on 10/03/2017 at 4:18 PM  Between 7am to 6pm - Pager - [phone]  After 6pm go to [email or web address] - [facility]

## 2017-10-03 NOTE — Progress Notes (Signed)
Patient coughing quite a bit with thin liquids, MD made aware and speech consult ordered. Patient appears to be doing well with pills crushed in apple sauce. Will continue to monitor closely.

## 2017-10-03 NOTE — Progress Notes (Signed)
Advanced Care Plan.  Purpose of Encounter: CODE STATUS. Parties in Attendance: The patient, his daughter and other family members, and me. Patient's Decisional Capacity: No. Medical Story: Albert Boone  is a 84 y.o. male with a known history of chronic atrial fibrillation, on Eliquis, essential hypertension, hyperlipidemia, chronic kidney disease stage III, GERD is brought into the emergency department via EMS from his assisted living facility with left-sided chest pain associated with shortness of breath.  He is admitted for acute hypoxic respiratory failure secondary to pneumothorax and pneumomediastinum; no tension pneumothorax.  I discussed with the patient's family member about his current condition, poor prognosis and CODE STATUS.  The patient's daughter decided DNR.  I changed from full code to DNR status. Plan:  Code Status: DNR. Time spent discussing advance care planning: 17-18 minutes.

## 2017-10-03 NOTE — Evaluation (Signed)
Clinical/Bedside Swallow Evaluation Patient Details  Name: Albert Boone MRN: [id number] Date of Birth: 1922-01-14  Today's Date: 10/03/2017 Time: SLP Start Time (ACUTE ONLY): 0930 SLP Stop Time (ACUTE ONLY): 1030 SLP Time Calculation (min) (ACUTE ONLY): 60 min  Past Medical History:  Past Medical History:  Diagnosis Date  . A-fib ([place])   . Dementia   . GERD (gastroesophageal reflux disease)   . Hyperlipemia   . Hypertension   . Prostatitis   . Renal disorder   . Stroke [facility])    mini strokes  . TIA (transient ischemic attack)    Past Surgical History:  Past Surgical History:  Procedure Laterality Date  . SKIN CANCER EXCISION     multiple times   HPI:  Pt is a 84 y.o. male with a known history of chronic atrial fibrillation, on Eliquis, essential hypertension, hyperlipidemia, chronic kidney disease stage III, GERD is brought into the emergency department via EMS from his assisted living facility with left-sided chest pain associated with shortness of breath which started this morning.  Patient took 324 mg of aspirin prior to ER arrival.  Chest x-ray and CT chest has revealed small pneumothorax.  Pt has also had intermittent, difficulty swallowing; dysphagia thought to be d/t worsening Dementia.  He has been having increased mucus and postnasal drip and runny nose.  MRI negative for stroke last admission last week.  MD dx'd acute on chronic dysphagia d/t worsening Dementia w/ poor prognosis last admission last week.  Pt has been exhibiting coughing w/ thin liquids at [facility] and this morning w/ NSG as well as at home prior per family report.    Assessment / Plan / Recommendation Clinical Impression  Pt appears to present w/ adequate oropharyngeal phase swallow function w/ thin liquids and modified food consistency (Puree) w/ no overt s/s of aspiration noted during po trials. However, pt does have severe Cognitive decline/Dementia which can impact awareness for the task of  oral intake and swallowing. Pt did exhibit some Confusion in accepting po trials via cup/Straw as well as pulling boluses from the spoon during food trials. Pt was easily distracted and required verbal/tactile/visual cues for follow through. No overt s/s of aspiration noted; no decline in respiratory status and vocal quality was clear b/t trials. Oral phase was grossly [facility] for bolus management and oral clearing w/ the trials of thin liquids and purees - solids were not given d/t pt's severe Confusion at this time. OM exam appeared grossly [facility] w/ no unilateral weakness noted. Recommend a Dysphagia level 1 (puree) w/ Thin liquids w/ aspiration precautions; feeding support at meals; Pills Crushed in puree for safer swallowing.  SLP Visit Diagnosis: Dysphagia, oropharyngeal phase (R13.12)    Aspiration Risk  Mild aspiration risk;Moderate aspiration risk;Risk for inadequate nutrition/hydration    Diet Recommendation  Dysphagia level 3 w/ Nectar liquids; aspiration precautions; Reflux precautions. Supervision during meals.   Medication Administration: Whole meds with puree(for safer swallowing)    Other  Recommendations Recommended Consults: (Dietician f/u; Palliative Care consult) Oral Care Recommendations: Oral care BID;Staff/trained caregiver to provide oral care Other Recommendations: Order thickener from pharmacy;Prohibited food (jello, ice cream, thin soups);Remove water pitcher;Have oral suction available   Follow up Recommendations Skilled Nursing facility      Frequency and Duration min 2x/week  1 week       Prognosis Prognosis for Safe Diet Advancement: Fair Barriers to Reach Goals: Cognitive deficits;Time post onset;Severity of deficits(baseline Dysphagia)      Swallow Study  General Date of Onset: 10/02/17 HPI: Pt is a 84 y.o. male with a known history of chronic atrial fibrillation, on Eliquis, essential hypertension, hyperlipidemia, chronic kidney disease stage III, GERD is  brought into the emergency department via EMS from his assisted living facility with left-sided chest pain associated with shortness of breath which started this morning.  Patient took 324 mg of aspirin prior to ER arrival.  Chest x-ray and CT chest has revealed small pneumothorax.  Pt has also had intermittent, difficulty swallowing; dysphagia thought to be d/t worsening Dementia.  He has been having increased mucus and postnasal drip and runny nose.  MRI negative for stroke last admission last week.  MD dx'd acute on chronic dysphagia d/t worsening Dementia w/ poor prognosis last admission last week.  Pt has been exhibiting coughing w/ thin liquids at [facility] and this morning w/ NSG as well as at home prior per family report.  Type of Study: Bedside Swallow Evaluation Previous Swallow Assessment: 09/28/2017 - last admission Diet Prior to this Study: Dysphagia 2 (chopped);Thin liquids Temperature Spikes Noted: No(wbc 8.4) Respiratory Status: Nasal cannula(2 liters) History of Recent Intubation: No Behavior/Cognition: Alert;Cooperative;Pleasant mood;Confused;Distractible;Requires cueing Oral Cavity Assessment: Within Functional Limits Oral Care Completed by SLP: Recent completion by staff Oral Cavity - Dentition: (partial plates) Vision: Functional for self-feeding Self-Feeding Abilities: Able to feed self;Needs assist;Needs set up Patient Positioning: Upright in bed(needed positioning) Baseline Vocal Quality: Low vocal intensity(mild decreased articulation of speech; min decreased effort) Volitional Cough: Weak Volitional Swallow: Able to elicit    Oral/Motor/Sensory Function Overall Oral Motor/Sensory Function: Generalized oral weakness(more labial) Facial ROM: Within Functional Limits Facial Symmetry: Within Functional Limits Facial Strength: Within Functional Limits Lingual ROM: Within Functional Limits Lingual Symmetry: Within Functional Limits Lingual Strength: Within Functional  Limits Velum: Within Functional Limits Mandible: Within Functional Limits   Ice Chips Ice chips: Not tested   Thin Liquid Thin Liquid: Not tested Other Comments: pt has been exhibiting coughing w/ thin liquid at [facility] and w/ NSG this morning. Intermittent coughing w/ thin liquids has been pt's presentation for recent weeks per family report.     Nectar Thick Nectar Thick Liquid: Impaired Presentation: Cup;Self Fed(~3-4ozs total) Oral Phase Impairments: (none) Oral phase functional implications: (none) Pharyngeal Phase Impairments: Suspected delayed Swallow;Throat Clearing - Delayed(x2 but moreso w/ the trials of drink supplements) Other Comments: no immediate overt s/s of aspiration noted but suspect trial consistencies of increased thickness/texture   Honey Thick Honey Thick Liquid: Not tested   Puree Puree: Within functional limits Presentation: Self Fed;Spoon(~4 ozs total) Other Comments: appeared wfl though pt had some mild throat clearing w/ trials of magic cup   Solid     Solid: Impaired Presentation: Self Fed;Spoon(5-6 trials) Oral Phase Impairments: Impaired mastication Oral Phase Functional Implications: Prolonged oral transit(min munching w/ increased texture; time needed) Pharyngeal Phase Impairments: (none) Other Comments: w/ time, pt managed the boluses        Orinda Kenner, MS, CCC-SLP Daneen Volcy 10/03/2017,1:55 PM

## 2017-10-03 NOTE — Progress Notes (Signed)
Initial Nutrition Assessment  DOCUMENTATION CODES:   Severe malnutrition in context of social or environmental circumstances  INTERVENTION:   Nepro Shake po BID, each supplement provides 425 kcal and 19 grams protein  Vital Cuisine TID, each supplement provides 520kcal and 22g of protein.   Magic cup TID with meals, each supplement provides 290 kcal and 9 grams of protein  MVI daily   Dysphagia 3 diet   NUTRITION DIAGNOSIS:   Severe Malnutrition related to social / environmental circumstances(dementia, advanced age ) as evidenced by moderate to severe fat depletions, moderate to severe muscle depletions.  GOAL:   Patient will meet greater than or equal to 90% of their needs  MONITOR:   PO intake, Supplement acceptance, Labs, Weight trends, Skin, I & O's  REASON FOR ASSESSMENT:   Malnutrition Screening Tool    ASSESSMENT:   84 y.o. male with a known history of chronic atrial fibrillation, on Eliquis, essential hypertension, hyperlipidemia, chronic kidney disease stage III, GERD is brought into the emergency department via EMS from his assisted living facility with left-sided chest pain associated with shortness of breath. Pt found to have pneumothorax    Met with pt and family in room today. Pt with dementia and is a poor historian so history obtained from pt's family at bedside who reports pt with poor appetite and oral intake at baseline. Pt being seen by SLP at time of RD visit today; will be initiated on a dysphagia 3/nectar thick diet. Pt enjoyed Nepro supplement during SLP evaluation but coughing up phlegm into a cup afterwards. Pt also eating an orange [employer]. Per chart, pt appears to have lost ~10-15lbs but unsure of the time frame in which weight was lost. Apparently, pt has been having difficulty with swallowing for ~ 1 week per chart. RD will add supplements to help pt meet his estimated needs. Palliative consult pending.   Medications reviewed and include:  Plavix, colace, megace, MVI, protonix   Labs reviewed:   NUTRITION - FOCUSED PHYSICAL EXAM:    Most Recent Value  Orbital Region  Moderate depletion  Upper Arm Region  Severe depletion  Thoracic and Lumbar Region  Severe depletion  Buccal Region  Moderate depletion  Temple Region  Moderate depletion  Clavicle Bone Region  Severe depletion  Clavicle and Acromion Bone Region  Severe depletion  Scapular Bone Region  Severe depletion  Dorsal Hand  Severe depletion  Patellar Region  Unable to assess  Anterior Thigh Region  Unable to assess  Posterior Calf Region  Unable to assess  Edema (RD Assessment)  None  Hair  Reviewed  Eyes  Reviewed  Mouth  Reviewed  Skin  Reviewed  Nails  Reviewed     Diet Order:   Diet Order            DIET DYS 3 Room service appropriate? Yes with Assist; Fluid consistency: Nectar Thick  Diet effective now             EDUCATION NEEDS:   Education needs have been addressed(with pt's daughter )  Skin:  Skin Assessment: Reviewed RN Assessment(ecchymosis )  Last BM:  9/21  Height:   Ht Readings from Last 1 Encounters:  10/02/17 5' 8"  (1.727 m)    Weight:   Wt Readings from Last 1 Encounters:  10/02/17 55.5 kg    Ideal Body Weight:  70 kg  BMI:  Body mass index is 18.6 kg/m.  Estimated Nutritional Needs:   Kcal:  1600-1800kcal/day  Protein:  83-94g/day   Fluid:  >1.4L/day   Koleen Distance MS, RD, LDN Pager #- [phone] Office#- [phone] After Hours Pager: [phone]

## 2017-10-04 ENCOUNTER — Inpatient Hospital Stay: Payer: PPO

## 2017-10-04 NOTE — Clinical Social Work Note (Signed)
Patient is medically ready for discharge back to [facility] today. CSW notified patient's wife in room. CSW also notified patient's daughter Gus Puma [phone]. Daughter states that she will transport or have facility transport patient.   [place], [place]

## 2017-10-04 NOTE — Progress Notes (Signed)
Speech Language Pathology Treatment: Dysphagia  Patient Details Name: Albert Boone MRN: [id number] DOB: Apr 05, 1922 Today's Date: 10/04/2017 Time: 1030-1100 SLP Time Calculation (min) (ACUTE ONLY): 30 min  Assessment / Plan / Recommendation Clinical Impression  Met w/ family, pt this morning to provide education on diet consistency; aspiration precautions. Pt appears to be tolerating his currently recommended diet w/ Nectar consistency liquids, Mech Soft foods per family report. Overall, no decline in status since yesterday per chart notes, and pt is d/t discharge back to [facility] today per family, NSG. Palliative Care services will follow and work w/ pt/family at [facility] for goals of care. Due to pt's baseline Neurological and declined medical status' w/ advanced age, pt does present w/ ongoing concern for issues of swallowing and risk for pulmonary impact. Family have voiced their desire for pt's diet to be "more regular" for his enjoyment and will monitor the food choices and cut them well for him. Discussed the benefits of a slower consistency liquid consistency at least during meals; Daughter agreed but elaborated that the family could provide (thin) liquids in his room at [facility] as he desires. Again, Palliative Care f/u is recommended.  Recommend continue a Dysphagia level 3(PUREE) w/ NECTAR consistency liquids VIA CUP at meals - pt to hold to drink to increase Cognitive input w/ task. Recommend Pills Whole in puree and strict aspiration precautions; Supervision/monitoring w/ all oral intake for follow through w/ swallowing precautions/strategies including rest breaks to monitor respiratory status as needed; oral clearing b/t bites and slowing down when eating/drinking. Thorough education was had on swallowing precautions; dysphagia; respiratory support for task of swallowing; thickened liquids and ordering information = hopefully to reduce risk for aspiration in light of his  baseline co-morbidities and risks. Encouraged ongoing discussion once returned to [facility] w/ family and MD for goals of care. Family agreed.    HPI HPI: Pt is a 84 y.o. male with a known history of chronic atrial fibrillation, on Eliquis, essential hypertension, hyperlipidemia, chronic kidney disease stage III, GERD is brought into the emergency department via EMS from his assisted living facility with left-sided chest pain associated with shortness of breath which started this morning.  Patient took 324 mg of aspirin prior to ER arrival.  Chest x-ray and CT chest has revealed small pneumothorax.  Pt has also had intermittent, difficulty swallowing; dysphagia thought to be d/t worsening Dementia.  He has been having increased mucus and postnasal drip and runny nose.  MRI negative for stroke last admission last week.  MD dx'd acute on chronic dysphagia d/t worsening Dementia w/ poor prognosis last admission last week.  Pt has been exhibiting coughing w/ thin liquids at [facility], and this morning w/ NSG, as well as at home prior per family report.       SLP Plan  All goals met(family met w/ MD/Palliative Care for goals of care)       Recommendations  Diet recommendations: Dysphagia 3 (mechanical soft);Nectar-thick liquid Liquids provided via: Cup;No straw Medication Administration: Whole meds with puree(for easier, safer swallowing) Supervision: Patient able to self feed;Intermittent supervision to cue for compensatory strategies Compensations: Minimize environmental distractions;Slow rate;Small sips/bites;Lingual sweep for clearance of pocketing;Multiple dry swallows after each bite/sip;Follow solids with liquid Postural Changes and/or Swallow Maneuvers: Seated upright 90 degrees;Upright 30-60 min after meal                General recommendations: (Dietician f/u as needed) Oral Care Recommendations: Oral care BID;Staff/trained caregiver to provide oral care Follow  up Recommendations:  Skilled Nursing facility([facility] return) SLP Visit Diagnosis: Dysphagia, oropharyngeal phase (R13.12)(appears baseline; ongoing. Cognitive decline.) Plan: All goals met(family met w/ MD/Palliative Care for goals of care)       GO                 Orinda Kenner, MS, CCC-SLP Watson,Katherine 10/04/2017, 5:00 PM

## 2017-10-04 NOTE — Progress Notes (Signed)
New referral for outpatient Palliative to follow at [place] received from [facility]. Plan is for discharge today. Patient information faxed to referral. Flo Shanks RN, BSN, [facility] and [facility] [place], hospital Liaison [phone]

## 2017-10-04 NOTE — Discharge Summary (Signed)
[place] at [place] NAME: Albert Boone    MR#:  [id number]  DATE OF BIRTH:  02/06/1922  DATE OF ADMISSION:  10/02/2017   ADMITTING PHYSICIAN: Nicholes Mango, MD  DATE OF DISCHARGE:  10/04/2017  PRIMARY CARE PHYSICIAN: Rusty Aus, MD   ADMISSION DIAGNOSIS:  Pneumomediastinum ([place]) [J98.2] Pneumothorax, unspecified type [J93.9] DISCHARGE DIAGNOSIS:  Active Problems:   Acute respiratory failure (HCC)   Protein-calorie malnutrition, severe  SECONDARY DIAGNOSIS:   Past Medical History:  Diagnosis Date  . A-fib ([place])   . Dementia   . GERD (gastroesophageal reflux disease)   . Hyperlipemia   . Hypertension   . Prostatitis   . Renal disorder   . Stroke [facility])    mini strokes  . TIA (transient ischemic attack)    HOSPITAL COURSE:  Acute hypoxic respiratory failure secondary to pneumothorax and pneumomediastinum;no tension pneumothorax He was on nonrebreather on 15 L of oxygen, wean off this am. Pain management as needed Prophylactic antibiotics Rocephin and Flagyl were given in the ED by the ED physician. The patient's pneumomediastinum and pneumothorax are very small and do not require acute intervention; would recommend feeding tube if that is in line with the patient'sand family's wishes per Dr. Felicie Morn. Repeat CXR: stable.  Chronic atrial for ablation rate controlled continue home medication Eliquis, Cardizem  Chronic kidney disease stage III Seems to be at his baseline. Avoid nephrotoxins and renal dose other medications  Hyperlipidemia continue Lipitor  GERD Protonix  Dysphagia.  Dysphagia 3 diet.  Aspiration precaution.  Generalized weakness.  PT evaluation suggest home health and PT.  Poor prognosis, palliative care follow in ALF. DISCHARGE CONDITIONS:  Stable, discharge to ALF with HHPT. CONSULTS OBTAINED:  Treatment Team:  Rosine Door, MD Laverle Hobby, MD DRUG ALLERGIES:    Allergies  Allergen Reactions  . Ace Inhibitors Other (See Comments)    Renal failure  . Nsaids Other (See Comments)    Renal failure  . Clopidogrel Diarrhea  . Penicillins Rash    Has patient had a PCN reaction causing immediate rash, facial/tongue/throat swelling, SOB or lightheadedness with hypotension: Yes Has patient had a PCN reaction causing severe rash involving mucus membranes or skin necrosis: No Has patient had a PCN reaction that required hospitalization No Has patient had a PCN reaction occurring within the last 10 years: No If all of the above answers are "NO", then may proceed with Cephalosporin use.   . Tramadol Rash    With itching   DISCHARGE MEDICATIONS:   Allergies as of 10/04/2017      Reactions   Ace Inhibitors Other (See Comments)   Renal failure   Nsaids Other (See Comments)   Renal failure   Clopidogrel Diarrhea   Penicillins Rash   Has patient had a PCN reaction causing immediate rash, facial/tongue/throat swelling, SOB or lightheadedness with hypotension: Yes Has patient had a PCN reaction causing severe rash involving mucus membranes or skin necrosis: No Has patient had a PCN reaction that required hospitalization No Has patient had a PCN reaction occurring within the last 10 years: No If all of the above answers are "NO", then may proceed with Cephalosporin use.   Tramadol Rash   With itching      Medication List    TAKE these medications   apixaban 2.5 MG Tabs tablet Commonly known as:  ELIQUIS Take 2.5 mg by mouth 2 (two) times daily.   clopidogrel 75 MG tablet Commonly known  as:  PLAVIX Take 75 mg by mouth daily.   diltiazem 120 MG 24 hr capsule Commonly known as:  CARDIZEM CD Take 1 capsule (120 mg total) by mouth daily.   fluticasone 50 MCG/ACT nasal spray Commonly known as:  FLONASE Place 2 sprays into both nostrils at bedtime.   megestrol 40 MG tablet Commonly known as:  MEGACE Take 40 mg by mouth daily.   ondansetron 4  MG disintegrating tablet Commonly known as:  ZOFRAN-ODT Take 1 tablet (4 mg total) by mouth every 8 (eight) hours as needed for nausea or vomiting.   pantoprazole 40 MG tablet Commonly known as:  PROTONIX Take 40 mg by mouth daily.   silodosin 4 MG Caps capsule Commonly known as:  RAPAFLO Take 1 capsule (4 mg total) by mouth daily with breakfast.        DISCHARGE INSTRUCTIONS:  See AVS.  If you experience worsening of your admission symptoms, develop shortness of breath, life threatening emergency, suicidal or homicidal thoughts you must seek medical attention immediately by calling 911 or calling your MD immediately  if symptoms less severe.  You Must read complete instructions/literature along with all the possible adverse reactions/side effects for all the Medicines you take and that have been prescribed to you. Take any new Medicines after you have completely understood and accpet all the possible adverse reactions/side effects.   Please note  You were cared for by a hospitalist during your hospital stay. If you have any questions about your discharge medications or the care you received while you were in the hospital after you are discharged, you can call the unit and asked to speak with the hospitalist on call if the hospitalist that took care of you is not available. Once you are discharged, your primary care physician will handle any further medical issues. Please note that NO REFILLS for any discharge medications will be authorized once you are discharged, as it is imperative that you return to your primary care physician (or establish a relationship with a primary care physician if you do not have one) for your aftercare needs so that they can reassess your need for medications and monitor your lab values.    On the day of Discharge:  VITAL SIGNS:  Blood pressure 120/77, pulse 82, temperature 98.6 F (37 C), temperature source Oral, resp. rate 18, height 5\' 8"  (1.727 m),  weight 55.5 kg, SpO2 97 %. PHYSICAL EXAMINATION:  GENERAL:  84 y.o.-year-old patient lying in the bed with no acute distress.  EYES: Pupils equal, round, reactive to light and accommodation. No scleral icterus. Extraocular muscles intact.  HEENT: Head atraumatic, normocephalic. Oropharynx and nasopharynx clear.  NECK:  Supple, no jugular venous distention. No thyroid enlargement, no tenderness.  LUNGS: Normal breath sounds bilaterally, no wheezing, rales,rhonchi or crepitation. No use of accessory muscles of respiration.  CARDIOVASCULAR: S1, S2 normal. No murmurs, rubs, or gallops.  ABDOMEN: Soft, non-tender, non-distended. Bowel sounds present. No organomegaly or mass.  EXTREMITIES: No pedal edema, cyanosis, or clubbing.  NEUROLOGIC: Cranial nerves II through XII are intact. Muscle strength 5/5 in all extremities. Sensation intact. Gait not checked.  PSYCHIATRIC: The patient is alert and oriented x 3.  SKIN: No obvious rash, lesion, or ulcer.  DATA REVIEW:   CBC Recent Labs  Lab 10/03/17 0509  WBC 8.4  HGB 12.5*  HCT 36.5*  PLT 178    Chemistries  Recent Labs  Lab 10/03/17 0509  NA 142  K 3.5  CL 111  CO2 26  GLUCOSE 119*  BUN 14  CREATININE 0.95  CALCIUM 8.4*  AST 17  ALT 10  ALKPHOS 42  BILITOT 0.9     Microbiology Results  Results for orders placed or performed during the hospital encounter of 09/09/17  Urine Culture     Status: Abnormal   Collection Time: 09/09/17  4:32 PM  Result Value Ref Range Status   Specimen Description   Final    URINE, CLEAN CATCH Performed at [facility], [address], [place], [place] [address]    Special Requests   Final    NONE Performed at [facility], [address] [place], [place] [address]    Culture >=100,000 COLONIES/mL Su Hoff (A)  Final   Report Status 09/11/2017 FINAL  Final   Organism ID, Bacteria CITROBACTER YOUNGAE (A)  Final      Susceptibility   Citrobacter youngae - MIC*     CEFAZOLIN >=64 RESISTANT Resistant     CEFTRIAXONE <=1 SENSITIVE Sensitive     CIPROFLOXACIN <=0.25 SENSITIVE Sensitive     GENTAMICIN <=1 SENSITIVE Sensitive     IMIPENEM <=0.25 SENSITIVE Sensitive     NITROFURANTOIN 32 SENSITIVE Sensitive     TRIMETH/SULFA <=20 SENSITIVE Sensitive     PIP/TAZO <=4 SENSITIVE Sensitive     * >=100,000 COLONIES/mL CITROBACTER YOUNGAE    RADIOLOGY:  No results found.   Management plans discussed with the patient, his daughter and they are in agreement.  CODE STATUS: DNR   TOTAL TIME TAKING CARE OF THIS PATIENT: 35 minutes.    Demetrios Loll M.D on 10/04/2017 at 9:34 AM  Between 7am to 6pm - Pager - [phone]  After 6pm go to [email or web address] - Proofreader  [facility] [employer] [facility]  Office  [phone]  CC: Primary care physician; Rusty Aus, MD   Note: This dictation was prepared with Dragon dictation along with smaller phrase technology. Any transcriptional errors that result from this process are unintentional.

## 2017-10-04 NOTE — Progress Notes (Signed)
Patient alert and oriented, vss, no complaints of pain.  D/c to [facility].  D/c telemetry and PIV.  No questions.  Escorted out of hospital via wheelchair by volunteers.

## 2017-10-04 NOTE — Plan of Care (Signed)
PMT note:  Consult noted. Patient is discharging today with palliative care outpatient.  Thank you for your referral.

## 2017-10-04 NOTE — Care Management Note (Signed)
Case Management Note  Patient Details  Name: Albert Boone MRN: [id number] Date of Birth: Nov 24, 1922  Subjective/Objective:      Admitted with acute respiratory failure.  Discharging to [facility] assisted living.  Patient is new DNR, on room air.  Chronic Eliquis.  Daughter, Albert Boone states no difficulties with obtaining medications or transportation.  Current with PCP.  He has a cane, walker, wheelchair at home.  Family would like palliative care.  Notified Albert Boone.  Will discharge with home health PT, RN with [facility].   Patient will discharge either with daughter Albert Boone or Albert Boone will provide transportation.                Action/Plan:   Expected Discharge Date:  10/04/17               Expected Discharge Plan:  [place]  In-House Referral:     Discharge planning Services  CM Consult  Post Acute Care Choice:    Choice offered to:  Adult Children  DME Arranged:    DME Agency:     HH Arranged:  RN, PT [place] Agency:  [place]  Status of Service:  Completed, signed off  If discussed at [place] of Stay Meetings, dates discussed:    Additional Comments:  Albert Rafter, RN 10/04/2017, 9:57 AM

## 2017-10-04 NOTE — Discharge Instructions (Signed)
Aspiration and fall precaution Dysphagia 3 diet.  Aspiration precaution. HHPT Palliative care follow in ALF. Follow up PCP/cardiologist to discuss about if discontinuation of anticoagulation (Eliquis)

## 2017-10-05 ENCOUNTER — Other Ambulatory Visit: Payer: Self-pay

## 2017-10-05 NOTE — Patient Outreach (Signed)
[facility]) Care Management  10/05/2017  [name] 1922-09-05 [id number]   Care Coordination: Unsuccessful telephone encounter to the above patient in an attempt to establish care with [facility]. Referral received 10/04/17 from [facility] management for high ED utilization with 6 or more ED visits within the last 6 months. RN CM unable to leave VM message.  Plan: Will send unsuccessful outreach letter and attempt 2nd phone contact in 3-4 days if no response.  Jelani Vreeland E. Rollene Rotunda RN, BSN [facility] Care Management Coordinator [phone]

## 2017-10-06 DIAGNOSIS — J9601 Acute respiratory failure with hypoxia: Secondary | ICD-10-CM | POA: Diagnosis not present

## 2017-10-06 DIAGNOSIS — Z515 Encounter for palliative care: Secondary | ICD-10-CM | POA: Diagnosis not present

## 2017-10-07 ENCOUNTER — Emergency Department: Payer: PPO

## 2017-10-07 ENCOUNTER — Other Ambulatory Visit: Payer: Self-pay

## 2017-10-07 ENCOUNTER — Observation Stay
Admission: EM | Admit: 2017-10-07 | Discharge: 2017-10-08 | Disposition: A | Discharge status: Hospice | Payer: PPO | Attending: Internal Medicine | Admitting: Internal Medicine

## 2017-10-07 DIAGNOSIS — Z886 Allergy status to analgesic agent status: Secondary | ICD-10-CM | POA: Diagnosis not present

## 2017-10-07 DIAGNOSIS — Z515 Encounter for palliative care: Secondary | ICD-10-CM

## 2017-10-07 DIAGNOSIS — R0600 Dyspnea, unspecified: Secondary | ICD-10-CM | POA: Diagnosis not present

## 2017-10-07 DIAGNOSIS — E46 Unspecified protein-calorie malnutrition: Secondary | ICD-10-CM | POA: Diagnosis not present

## 2017-10-07 DIAGNOSIS — I69391 Dysphagia following cerebral infarction: Principal | ICD-10-CM | POA: Insufficient documentation

## 2017-10-07 DIAGNOSIS — Z681 Body mass index (BMI) 19 or less, adult: Secondary | ICD-10-CM | POA: Diagnosis not present

## 2017-10-07 DIAGNOSIS — Z880 Allergy status to penicillin: Secondary | ICD-10-CM | POA: Diagnosis not present

## 2017-10-07 DIAGNOSIS — I10 Essential (primary) hypertension: Secondary | ICD-10-CM | POA: Diagnosis not present

## 2017-10-07 DIAGNOSIS — F0390 Unspecified dementia without behavioral disturbance: Secondary | ICD-10-CM | POA: Diagnosis not present

## 2017-10-07 DIAGNOSIS — Z888 Allergy status to other drugs, medicaments and biological substances status: Secondary | ICD-10-CM | POA: Diagnosis not present

## 2017-10-07 DIAGNOSIS — I4510 Unspecified right bundle-branch block: Secondary | ICD-10-CM | POA: Insufficient documentation

## 2017-10-07 DIAGNOSIS — K219 Gastro-esophageal reflux disease without esophagitis: Secondary | ICD-10-CM | POA: Diagnosis not present

## 2017-10-07 DIAGNOSIS — E785 Hyperlipidemia, unspecified: Secondary | ICD-10-CM | POA: Diagnosis not present

## 2017-10-07 DIAGNOSIS — Z85828 Personal history of other malignant neoplasm of skin: Secondary | ICD-10-CM | POA: Diagnosis not present

## 2017-10-07 DIAGNOSIS — Z66 Do not resuscitate: Secondary | ICD-10-CM | POA: Insufficient documentation

## 2017-10-07 DIAGNOSIS — Z79899 Other long term (current) drug therapy: Secondary | ICD-10-CM | POA: Diagnosis not present

## 2017-10-07 DIAGNOSIS — E43 Unspecified severe protein-calorie malnutrition: Secondary | ICD-10-CM | POA: Insufficient documentation

## 2017-10-07 DIAGNOSIS — R0602 Shortness of breath: Secondary | ICD-10-CM | POA: Diagnosis present

## 2017-10-07 DIAGNOSIS — Z87891 Personal history of nicotine dependence: Secondary | ICD-10-CM | POA: Insufficient documentation

## 2017-10-07 DIAGNOSIS — J939 Pneumothorax, unspecified: Secondary | ICD-10-CM | POA: Insufficient documentation

## 2017-10-07 DIAGNOSIS — J9601 Acute respiratory failure with hypoxia: Secondary | ICD-10-CM | POA: Diagnosis not present

## 2017-10-07 DIAGNOSIS — J849 Interstitial pulmonary disease, unspecified: Secondary | ICD-10-CM | POA: Diagnosis not present

## 2017-10-07 DIAGNOSIS — I700 Atherosclerosis of aorta: Secondary | ICD-10-CM | POA: Diagnosis not present

## 2017-10-07 DIAGNOSIS — I4891 Unspecified atrial fibrillation: Secondary | ICD-10-CM | POA: Diagnosis not present

## 2017-10-07 DIAGNOSIS — R1310 Dysphagia, unspecified: Secondary | ICD-10-CM

## 2017-10-07 DIAGNOSIS — Z7901 Long term (current) use of anticoagulants: Secondary | ICD-10-CM | POA: Insufficient documentation

## 2017-10-07 LAB — CBC WITH DIFFERENTIAL/PLATELET
BASOS ABS: 0 10*3/uL (ref 0–0.1)
Basophils Relative: 0 %
Eosinophils Absolute: 0.1 10*3/uL (ref 0–0.7)
Eosinophils Relative: 1 %
HEMATOCRIT: 42.1 % (ref 40.0–52.0)
Hemoglobin: 14.3 g/dL (ref 13.0–18.0)
Lymphocytes Relative: 6 %
Lymphs Abs: 0.8 10*3/uL — ABNORMAL LOW (ref 1.0–3.6)
MCH: 31.2 pg (ref 26.0–34.0)
MCHC: 33.9 g/dL (ref 32.0–36.0)
MCV: 91.8 fL (ref 80.0–100.0)
MONO ABS: 0.8 10*3/uL (ref 0.2–1.0)
MONOS PCT: 7 %
NEUTROS ABS: 10.6 10*3/uL — AB (ref 1.4–6.5)
Neutrophils Relative %: 86 %
Platelets: 274 10*3/uL (ref 150–440)
RBC: 4.58 MIL/uL (ref 4.40–5.90)
RDW: 14.3 % (ref 11.5–14.5)
WBC: 12.4 10*3/uL — ABNORMAL HIGH (ref 3.8–10.6)

## 2017-10-07 LAB — COMPREHENSIVE METABOLIC PANEL
ALT: 13 U/L (ref 0–44)
AST: 18 U/L (ref 15–41)
Albumin: 3.9 g/dL (ref 3.5–5.0)
Alkaline Phosphatase: 50 U/L (ref 38–126)
Anion gap: 11 (ref 5–15)
BILIRUBIN TOTAL: 1.1 mg/dL (ref 0.3–1.2)
BUN: 18 mg/dL (ref 8–23)
CALCIUM: 9.4 mg/dL (ref 8.9–10.3)
CO2: 25 mmol/L (ref 22–32)
Chloride: 109 mmol/L (ref 98–111)
Creatinine, Ser: 1.02 mg/dL (ref 0.61–1.24)
GFR calc Af Amer: >60 mL/min (ref >60)
Glucose, Bld: 93 mg/dL (ref 70–99)
POTASSIUM: 4.2 mmol/L (ref 3.5–5.1)
Sodium: 145 mmol/L (ref 135–145)
TOTAL PROTEIN: 7.8 g/dL (ref 6.5–8.1)

## 2017-10-07 MED ORDER — MORPHINE SULFATE (PF) 2 MG/ML IV SOLN: 2.0000 mg | INTRAVENOUS | Status: DC | PRN

## 2017-10-07 MED ORDER — ONDANSETRON HCL 4 MG/2ML IJ SOLN: 4.0000 mg | Freq: Four times a day (QID) | INTRAMUSCULAR | Status: DC | PRN

## 2017-10-07 MED ORDER — HEPARIN SODIUM (PORCINE) 5000 UNIT/ML IJ SOLN
5000.0000 [IU] | Freq: Three times a day (TID) | INTRAMUSCULAR | Status: DC
  Administered 2017-10-07 – 2017-10-08 (×2): 5000 [IU] via SUBCUTANEOUS
  Filled 2017-10-07 (×2): qty 1

## 2017-10-07 MED ORDER — LORAZEPAM 2 MG/ML IJ SOLN
1.0000 mg | INTRAMUSCULAR | Status: DC | PRN
  Administered 2017-10-08 (×2): 1 mg via INTRAVENOUS
  Filled 2017-10-07 (×2): qty 1

## 2017-10-07 MED ORDER — ONDANSETRON HCL 4 MG PO TABS: 4.0000 mg | ORAL_TABLET | Freq: Four times a day (QID) | ORAL | Status: DC | PRN

## 2017-10-07 MED ORDER — ACETAMINOPHEN 650 MG RE SUPP: 650.0000 mg | Freq: Four times a day (QID) | RECTAL | Status: DC | PRN

## 2017-10-07 MED ORDER — ACETAMINOPHEN 325 MG PO TABS: 650.0000 mg | ORAL_TABLET | Freq: Four times a day (QID) | ORAL | Status: DC | PRN

## 2017-10-07 NOTE — ED Provider Notes (Signed)
[facility] Emergency Department Provider Note    First MD Initiated Contact with Patient 10/07/17 1515     (approximate)  I have reviewed the triage vital signs and the nursing notes.   HISTORY  Chief Complaint Shortness of Breath   Level V CaveaT:  dementia  HPI Albert Boone is a 84 y.o. male with recent admission the hospital for small pneumothorax presents to the ER due to concern for worsening shortness of breath.  Patient with multiple visits for dysphasia states he was also having some dysphasia this morning but not having any chest pain or shortness of breath at this time.  Was reportedly evaluated by palliative care provider and was told that he may come to the ER but would likely need admission to hospice of [place].    Past Medical History:  Diagnosis Date  . A-fib ([place])   . Dementia   . GERD (gastroesophageal reflux disease)   . Hyperlipemia   . Hypertension   . Prostatitis   . Renal disorder   . Stroke [facility])    mini strokes  . TIA (transient ischemic attack)    Family History  Problem Relation Age of Onset  . Hypertension Father   . Parkinson's disease Father   . Pleurisy Mother   . Prostate cancer Neg Hx   . Hematuria Neg Hx   . Kidney disease Neg Hx    Past Surgical History:  Procedure Laterality Date  . SKIN CANCER EXCISION     multiple times   Patient Active Problem List   Diagnosis Date Noted  . Protein-calorie malnutrition, severe 10/03/2017  . Acute respiratory failure ([place]) 10/02/2017  . Dysphagia 09/26/2017  . Urinary retention 09/18/2017  . A-fib ([place]) 05/26/2017  . Hyperlipidemia 05/26/2017  . CKD (chronic kidney disease) stage 3, GFR 30-59 ml/min (HCC) 05/26/2017  . BPH associated with nocturia 05/26/2017  . TIA (transient ischemic attack) 04/20/2017  . Seizure ([place]) 01/05/2016  . Sepsis ([place]) 08/21/2015      Prior to Admission medications   Medication Sig Start Date End Date Taking?  Authorizing Provider  apixaban (ELIQUIS) 2.5 MG TABS tablet Take 2.5 mg by mouth 2 (two) times daily.     [provider]  clopidogrel (PLAVIX) 75 MG tablet Take 75 mg by mouth daily.    [provider]  diltiazem (CARDIZEM CD) 120 MG 24 hr capsule Take 1 capsule (120 mg total) by mouth daily. 08/19/17 08/19/18  Merlyn Lot, MD  fluticasone (FLONASE) 50 MCG/ACT nasal spray Place 2 sprays into both nostrils at bedtime. 09/28/17   Salary, Avel Peace, MD  megestrol (MEGACE) 40 MG tablet Take 40 mg by mouth daily.     [provider]  ondansetron (ZOFRAN ODT) 4 MG disintegrating tablet Take 1 tablet (4 mg total) by mouth every 8 (eight) hours as needed for nausea or vomiting. Patient not taking: Reported on 10/02/2017 09/09/17   Carrie Mew, MD  pantoprazole (PROTONIX) 40 MG tablet Take 40 mg by mouth daily.    [provider]  silodosin (RAPAFLO) 4 MG CAPS capsule Take 1 capsule (4 mg total) by mouth daily with breakfast. 09/16/17   Stoioff, Ronda Fairly, MD    Allergies Ace inhibitors; Nsaids; Clopidogrel; Penicillins; and Tramadol    Social History Social History   Tobacco Use  . Smoking status: Former Smoker    Types: Cigarettes    Last attempt to quit: 08/18/1966    Years since quitting: 51.1  .  Smokeless tobacco: Never Used  Substance Use Topics  . Alcohol use: Not Currently    Alcohol/week: 4.0 standard drinks    Types: 4 Glasses of wine per week  . Drug use: No    Review of Systems Patient denies headaches, rhinorrhea, blurry vision, numbness, shortness of breath, chest pain, edema, cough, abdominal pain, nausea, vomiting, diarrhea, dysuria, fevers, rashes or hallucinations unless otherwise stated above in HPI. ____________________________________________   PHYSICAL EXAM:  VITAL SIGNS: Vitals:   10/07/17 1530 10/07/17 1841  BP: (!) 116/94 (!) 155/80  Pulse: 70   Resp:    Temp:    SpO2: 96% 95%    Constitutional: Alert and  Eyes:  Conjunctivae are normal.  Head: Atraumatic. Nose: No congestion/rhinnorhea. Mouth/Throat: Mucous membranes are moist.   Neck: No stridor. Painless ROM.  Cardiovascular: Normal rate, regular rhythm. Grossly normal heart sounds.  Good peripheral circulation. Respiratory: Normal respiratory effort.  No retractions. Lungs CTAB. Gastrointestinal: Soft and nontender. No distention. No abdominal bruits. No CVA tenderness. Genitourinary:  Musculoskeletal: No lower extremity tenderness nor edema.  No joint effusions. Neurologic:  Normal speech and language. No gross focal neurologic deficits are appreciated. No facial droop Skin:  Skin is warm, dry and intact. No rash noted. Psychiatric: Mood and affect are normal. Speech and behavior are normal.  ____________________________________________   LABS (all labs ordered are listed, but only abnormal results are displayed)  Results for orders placed or performed during the hospital encounter of 10/07/17 (from the past 24 hour(s))  CBC with Differential/Platelet     Status: Abnormal   Collection Time: 10/07/17  3:43 PM  Result Value Ref Range   WBC 12.4 (H) 3.8 - 10.6 K/uL   RBC 4.58 4.40 - 5.90 MIL/uL   Hemoglobin 14.3 13.0 - 18.0 g/dL   HCT 42.1 40.0 - 52.0 %   MCV 91.8 80.0 - 100.0 fL   MCH 31.2 26.0 - 34.0 pg   MCHC 33.9 32.0 - 36.0 g/dL   RDW 14.3 11.5 - 14.5 %   Platelets 274 150 - 440 K/uL   Neutrophils Relative % 86 %   Neutro Abs 10.6 (H) 1.4 - 6.5 K/uL   Lymphocytes Relative 6 %   Lymphs Abs 0.8 (L) 1.0 - 3.6 K/uL   Monocytes Relative 7 %   Monocytes Absolute 0.8 0.2 - 1.0 K/uL   Eosinophils Relative 1 %   Eosinophils Absolute 0.1 0 - 0.7 K/uL   Basophils Relative 0 %   Basophils Absolute 0.0 0 - 0.1 K/uL  Comprehensive metabolic panel     Status: None   Collection Time: 10/07/17  3:43 PM  Result Value Ref Range   Sodium 145 135 - 145 mmol/L   Potassium 4.2 3.5 - 5.1 mmol/L   Chloride 109 98 - 111 mmol/L   CO2 25 22 - 32  mmol/L   Glucose, Bld 93 70 - 99 mg/dL   BUN 18 8 - 23 mg/dL   Creatinine, Ser 1.02 0.61 - 1.24 mg/dL   Calcium 9.4 8.9 - 10.3 mg/dL   Total Protein 7.8 6.5 - 8.1 g/dL   Albumin 3.9 3.5 - 5.0 g/dL   AST 18 15 - 41 U/L   ALT 13 0 - 44 U/L   Alkaline Phosphatase 50 38 - 126 U/L   Total Bilirubin 1.1 0.3 - 1.2 mg/dL   GFR calc non Af Amer >60 >60 mL/min   GFR calc Af Amer >60 >60 mL/min   Anion gap 11 5 -  15   ____________________________________________  EKG My review and personal interpretation at Time: 16:04   Indication: dysphagia  Rate: 90  Rhythm: sinus Axis: normal Other: rbbb, no stemi ____________________________________________  RADIOLOGY   ____________________________________________   PROCEDURES  Procedure(s) performed:  Procedures    Critical Care performed: no ____________________________________________   INITIAL IMPRESSION / ASSESSMENT AND PLAN / ED COURSE  Pertinent labs & imaging results that were available during my care of the patient were reviewed by me and considered in my medical decision making (see chart for details).   DDX: Dehydration, lecture light abnormality, mass, dysphasia  Albert Boone is a 84 y.o. who presents to the ED with symptoms as described above.  Patient was evaluated by palliative care today with plan to be taken to hospice.  Family and patient want to be evaluated in the ER.  Blood work sent for the above differential is roughly the patient's baseline.  As he is not eating I do believe that evaluation for hospice placement is appropriate  Clinical Course as of Oct 07 1936  Fri Oct 07, 2017  1714 Patient has been accepted to hospice however they do not have bed availability until tomorrow morning.   [PR]    Clinical Course User Index [PR] Merlyn Lot, MD     As part of my medical decision making, I reviewed the following data within the [place] notes reviewed and  incorporated, Labs reviewed, notes from prior ED visits and [place] Controlled Substance Database   ____________________________________________   FINAL CLINICAL IMPRESSION(S) / ED DIAGNOSES  Final diagnoses:  Dysphagia, unspecified type  End of life care      NEW MEDICATIONS STARTED DURING THIS VISIT:  New Prescriptions   No medications on file     Note:  This document was prepared using Dragon voice recognition software and may include unintentional dictation errors.    Merlyn Lot, MD 10/07/17 [phone]

## 2017-10-07 NOTE — H&P (Signed)
[place] [facility] [place] NAME: Albert Boone    MR#:  [id number]  DATE OF BIRTH:  02-26-1922  DATE OF ADMISSION:  10/07/2017  PRIMARY CARE PHYSICIAN: Rusty Aus, MD   REQUESTING/REFERRING PHYSICIAN: Merlyn Lot  CHIEF COMPLAINT: Worsening shortness of breath   Chief Complaint  Patient presents with  . Shortness of Breath    HISTORY OF PRESENT ILLNESS:  Albert Boone  is a 84 y.o. male with a known history of recent admission to the hospital with small pneumothorax came from home placed because of worsening shortness of breath, was told he can go to hospice home tomorrow so admitting overnight on comfort measures.  Patient evaluated with palliative care provider and was told that he can come to the emergency room, possible discharge to hospice home tomorrow.  Patient has trouble swallowing pills, shortness of breath.  Patient discharged 2 days ago after he was admitted for acute respiratory failure, severe protein calorie malnutrition, pneumothorax, pneumomediastinum.  She went to home place but according to family is having difficulty swallowing and he is choking on even water.  They do not want any further blood work or vitals or medication main focus is on comfort measures,  family said while home place cannot take care of his needs anymore would like him to be monitored overnight per patient for hospice home tomorrow. PAST MEDICAL HISTORY:   Past Medical History:  Diagnosis Date  . A-fib ([place])   . Dementia   . GERD (gastroesophageal reflux disease)   . Hyperlipemia   . Hypertension   . Prostatitis   . Renal disorder   . Stroke [facility])    mini strokes  . TIA (transient ischemic attack)     PAST SURGICAL HISTOIRY:   Past Surgical History:  Procedure Laterality Date  . SKIN CANCER EXCISION     multiple times    SOCIAL HISTORY:   Social History   Tobacco Use  . Smoking status: Former Smoker    Types:  Cigarettes    Last attempt to quit: 08/18/1966    Years since quitting: 51.1  . Smokeless tobacco: Never Used  Substance Use Topics  . Alcohol use: Not Currently    Alcohol/week: 4.0 standard drinks    Types: 4 Glasses of wine per week    FAMILY HISTORY:   Family History  Problem Relation Age of Onset  . Hypertension Father   . Parkinson's disease Father   . Pleurisy Mother   . Prostate cancer Neg Hx   . Hematuria Neg Hx   . Kidney disease Neg Hx     DRUG ALLERGIES:   Allergies  Allergen Reactions  . Ace Inhibitors Other (See Comments)    Renal failure  . Nsaids Other (See Comments)    Renal failure  . Clopidogrel Diarrhea  . Penicillins Rash    Has patient had a PCN reaction causing immediate rash, facial/tongue/throat swelling, SOB or lightheadedness with hypotension: Yes Has patient had a PCN reaction causing severe rash involving mucus membranes or skin necrosis: No Has patient had a PCN reaction that required hospitalization No Has patient had a PCN reaction occurring within the last 10 years: No If all of the above answers are "NO", then may proceed with Cephalosporin use.   . Tramadol Rash    With itching    REVIEW OF SYSTEMS:  CONSTITUTIONAL: No fever, fatigue or weakness.  EYES: No blurred or double vision.  EARS, NOSE, AND THROAT:  No tinnitus or ear pain.  RESPIRATORY: No cough, shortness of breath, wheezing or hemoptysis.  CARDIOVASCULAR: No chest pain, orthopnea, edema.  GASTROINTESTINAL: No nausea, vomiting, diarrhea or abdominal pain.  GENITOURINARY: No dysuria, hematuria.  ENDOCRINE: No polyuria, nocturia,  HEMATOLOGY: No anemia, easy bruising or bleeding SKIN: No rash or lesion. MUSCULOSKELETAL: No joint pain or arthritis.   NEUROLOGIC: No tingling, numbness, weakness.  PSYCHIATRY: No anxiety or depression.   MEDICATIONS AT HOME:   Prior to Admission medications   Medication Sig Start Date End Date Taking? Authorizing Provider  apixaban  (ELIQUIS) 2.5 MG TABS tablet Take 2.5 mg by mouth 2 (two) times daily.     [provider]  clopidogrel (PLAVIX) 75 MG tablet Take 75 mg by mouth daily.    [provider]  diltiazem (CARDIZEM CD) 120 MG 24 hr capsule Take 1 capsule (120 mg total) by mouth daily. 08/19/17 08/19/18  Merlyn Lot, MD  fluticasone (FLONASE) 50 MCG/ACT nasal spray Place 2 sprays into both nostrils at bedtime. 09/28/17   Salary, Avel Peace, MD  megestrol (MEGACE) 40 MG tablet Take 40 mg by mouth daily.     [provider]  ondansetron (ZOFRAN ODT) 4 MG disintegrating tablet Take 1 tablet (4 mg total) by mouth every 8 (eight) hours as needed for nausea or vomiting. Patient not taking: Reported on 10/02/2017 09/09/17   Carrie Mew, MD  pantoprazole (PROTONIX) 40 MG tablet Take 40 mg by mouth daily.    [provider]  silodosin (RAPAFLO) 4 MG CAPS capsule Take 1 capsule (4 mg total) by mouth daily with breakfast. 09/16/17   Stoioff, Ronda Fairly, MD      VITAL SIGNS:  Blood pressure (!) 151/102, pulse 99, temperature 98.2 F (36.8 C), temperature source Oral, resp. rate (!) 25, height 5\' 8"  (1.727 m), weight 55.5 kg, SpO2 95 %.  PHYSICAL EXAMINATION:  GENERAL:  84 y.o.-year-old patient lying in the bed with no acute distress.  Appears cachectic,/severely malnourished. EYES: Pupils equal, round, reactive to light and accommodation. No scleral icterus. Extraocular muscles intact.  HEENT: Head atraumatic, normocephalic. Oropharynx and nasopharynx clear.  NECK:  Supple, no jugular venous distention. No thyroid enlargement, no tenderness.  LUNGS: Normal breath sounds bilaterally, no wheezing, rales,rhonchi or crepitation. No use of accessory muscles of respiration.  CARDIOVASCULAR: S1, S2 normal. No murmurs, rubs, or gallops.  ABDOMEN: Soft, nontender, nondistended. Bowel sounds present. No organomegaly or mass.  EXTREMITIES: No pedal edema, cyanosis, or clubbing.  NEUROLOGIC: Cranial  nerves II through XII are intact. Muscle strength 5/5 in all extremities. Sensation intact. Gait not checked.  PSYCHIATRIC: The patient is alert and oriented x 3.  SKIN: No obvious rash, lesion, or ulcer.   LABORATORY PANEL:   CBC Recent Labs  Lab 10/07/17 1543  WBC 12.4*  HGB 14.3  HCT 42.1  PLT 274   ------------------------------------------------------------------------------------------------------------------  Chemistries  Recent Labs  Lab 10/07/17 1543  NA 145  K 4.2  CL 109  CO2 25  GLUCOSE 93  BUN 18  CREATININE 1.02  CALCIUM 9.4  AST 18  ALT 13  ALKPHOS 50  BILITOT 1.1   ------------------------------------------------------------------------------------------------------------------  Cardiac Enzymes Recent Labs  Lab 10/02/17 1137  TROPONINI 0.03*   ------------------------------------------------------------------------------------------------------------------  RADIOLOGY:  Dg Chest Portable 1 View  Result Date: 10/07/2017 CLINICAL DATA:  Dyspnea evaluate for pneumothorax EXAM: PORTABLE CHEST 1 VIEW COMPARISON:  10/04/2017, chest CT 10/02/2017 FINDINGS: Smaller left apical pneumothorax is noted now extending only to the second rib  level posteriorly versus to the third level previously. No pulmonary consolidation, effusion or dominant mass. Partial clearing of nodular infiltrates/densities in the left upper lobe since prior. Redemonstration of fine and coarse interstitial lung markings bilaterally. Heart size is top-normal. Atherosclerosis of the thoracic aorta with uncoiling noted. No aneurysm. Degenerative changes are noted of the dorsal spine. There is osteoarthritis with joint space narrowing and spurring of both AC joints and left glenohumeral joint. IMPRESSION: Smaller left apical pneumothorax since prior. Aortic atherosclerosis of borderline cardiomegaly. Chronic interstitial lung disease. Electronically Signed   By: Ashley Royalty M.D.   On: 10/07/2017  16:20    EKG:   Orders placed or performed during the hospital encounter of 10/07/17  . ED EKG  . ED EKG  . EKG 12-Lead  . EKG 12-Lead  . EKG 12-Lead  . EKG 12-Lead    IMPRESSION AND PLAN:   84 year old male patient with worsening dysphagia, severe protein calorie malnutrition approaching end-of-life, patient has been accepted to hospice however do not have bed availability until tomorrow morning so I spoke with family main focus is on comfort with you using Ativan, morphine.  Both patient's wife, patient's daughter agree.    All the records are reviewed and case discussed with ED provider. Management plans discussed with the patient, family and they are in agreement.  CODE STATUS: DNR  TOTAL TIME TAKING CARE OF THIS PATIENT: 50 minutes.    Epifanio Lesches M.D on 10/07/2017 at 8:54 PM  Between 7am to 6pm - Pager - [phone]  After 6pm go to [email or web address] - password [email or web address] [place] [facility]  Office  [phone]  CC: Primary care physician; Rusty Aus, MD  Note: This dictation was prepared with Dragon dictation along with smaller phrase technology. Any transcriptional errors that result from this process are unintentional.

## 2017-10-07 NOTE — ED Triage Notes (Signed)
Pt to ED from [facility] c/o SOB. Per EMS staff reported pt was treated this past Tuesday for a partial left lung collapse; and returns for continued SOB.

## 2017-10-07 NOTE — Patient Outreach (Signed)
[name]) Care Management  10/07/2017  Albert Boone 05/29/1922 [id number]  Care Coordination: Unsuccessful second telephone encounter attempt to reach the above patient to establish care with [facility]. Unable to leave message. Letter previously sent.  Plan: Follow up phone call in 3-4 business days.  Torrin Crihfield E. Rollene Rotunda RN, BSN [facility] Care Management Coordinator [phone]

## 2017-10-07 NOTE — ED Notes (Signed)
Family and pt requested monitor to be disconnected to avoid the alarms. Pt plan is to head to hospice care tomorrow morning. RN approved this.  Pt was also wet and EDT changed brief  Lm edt

## 2017-10-08 DIAGNOSIS — R1310 Dysphagia, unspecified: Secondary | ICD-10-CM | POA: Diagnosis not present

## 2017-10-08 DIAGNOSIS — R4182 Altered mental status, unspecified: Secondary | ICD-10-CM | POA: Diagnosis not present

## 2017-10-08 DIAGNOSIS — R0602 Shortness of breath: Secondary | ICD-10-CM | POA: Diagnosis not present

## 2017-10-08 DIAGNOSIS — E46 Unspecified protein-calorie malnutrition: Secondary | ICD-10-CM | POA: Diagnosis not present

## 2017-10-08 DIAGNOSIS — Z7401 Bed confinement status: Secondary | ICD-10-CM | POA: Diagnosis not present

## 2017-10-08 LAB — CBC
HCT: 40.8 % (ref 40.0–52.0)
Hemoglobin: 13.9 g/dL (ref 13.0–18.0)
MCH: 31.3 pg (ref 26.0–34.0)
MCHC: 34.2 g/dL (ref 32.0–36.0)
MCV: 91.6 fL (ref 80.0–100.0)
Platelets: 256 10*3/uL (ref 150–440)
RBC: 4.46 MIL/uL (ref 4.40–5.90)
RDW: 14.5 % (ref 11.5–14.5)
WBC: 11.2 10*3/uL — AB (ref 3.8–10.6)

## 2017-10-08 LAB — BASIC METABOLIC PANEL
Anion gap: 9 (ref 5–15)
BUN: 20 mg/dL (ref 8–23)
CALCIUM: 9.1 mg/dL (ref 8.9–10.3)
CO2: 27 mmol/L (ref 22–32)
Chloride: 110 mmol/L (ref 98–111)
Creatinine, Ser: 1.05 mg/dL (ref 0.61–1.24)
GFR, EST NON AFRICAN AMERICAN: 59 mL/min — AB (ref >60)
GLUCOSE: 106 mg/dL — AB (ref 70–99)
Potassium: 3.9 mmol/L (ref 3.5–5.1)
SODIUM: 146 mmol/L — AB (ref 135–145)

## 2017-10-08 LAB — GLUCOSE, CAPILLARY: GLUCOSE-CAPILLARY: 91 mg/dL (ref 70–99)

## 2017-10-08 MED ORDER — MORPHINE SULFATE (CONCENTRATE) 10 MG/0.5ML PO SOLN: 10.0000 mg | ORAL | Status: DC | PRN

## 2017-10-08 MED ORDER — GLYCOPYRROLATE 1 MG PO TABS
1.0000 mg | ORAL_TABLET | Freq: Two times a day (BID) | ORAL | Status: DC
  Filled 2017-10-08 (×3): qty 1

## 2017-10-08 MED ORDER — LORAZEPAM 2 MG/ML IJ SOLN: 1.0000 mg | INTRAMUSCULAR | 0 refills | Status: AC | PRN

## 2017-10-08 MED ORDER — MORPHINE SULFATE (CONCENTRATE) 10 MG/0.5ML PO SOLN: 10.0000 mg | ORAL | 0 refills | Status: AC | PRN

## 2017-10-08 NOTE — Progress Notes (Signed)
EMS contacted via telephone call for nonemergency transport to the [facility]

## 2017-10-08 NOTE — Progress Notes (Signed)
Discharge to hospice home of [facility] when the bed is available.  Discharge instructions on the computer.

## 2017-10-08 NOTE — Discharge Summary (Signed)
Albert Boone, is a 84 y.o. male  DOB March 05, 1922  MRN [id number].  Admission date:  10/07/2017  Admitting Physician  Epifanio Lesches, MD  Discharge Date:  10/08/2017   Primary MD  Rusty Aus, MD  Recommendations for primary care physician for things to follow:   Discharge to hospice home.   Admission Diagnosis  End of life care [Z51.5] Dysphagia, unspecified type [R13.10]   Discharge Diagnosis  End of life care [Z51.5] Dysphagia, unspecified type [R13.10]    Active Problems:   SOB (shortness of breath)      Past Medical History:  Diagnosis Date  . A-fib ([place])   . Dementia   . GERD (gastroesophageal reflux disease)   . Hyperlipemia   . Hypertension   . Prostatitis   . Renal disorder   . Stroke [facility])    mini strokes  . TIA (transient ischemic attack)     Past Surgical History:  Procedure Laterality Date  . SKIN CANCER EXCISION     multiple times       History of present illness and  Hospital Course:     Kindly see H&P for history of present illness and admission details, please review complete Labs, Consult reports and Test reports for all details in brief  HPI  from the history and physical done on the day of admission 84 year old male patient with history of previous pneumothorax came from [facility] home placed because of worsening shortness of breath, dysphagia, choking on the pills, patient admitted for comfort care, hospice placement.   Hospital Course  84 year old male with history of small pneumothorax recently comes in because of worsening shortness of breath.  Patient chest x-ray, lab data essentially unchanged from before family already spoke with palliative care at [facility] home place mentioned that he can go to ER, patient admitted for comfort measures, end-of-life  care and hospice home placement.  Started on Ativan, morphine, patient has hospice bed today,  Most likely life expectancy is  Less than 2 weeks.  Continue Roxanol, Ativan, scopolamine patch.  Discussed the plan with patient's daughter, wife, they are in agreement with it. 2.  Protein calorie malnutrition, patient has dysphagia, he is even choking on water.      Discharge Condition: Stable   Follow UP      Discharge Instructions  and  Discharge Medications      Allergies as of 10/08/2017      Reactions   Ace Inhibitors Other (See Comments)   Renal failure   Nsaids Other (See Comments)   Renal failure   Clopidogrel Diarrhea   Penicillins Rash   Has patient had a PCN reaction causing immediate rash, facial/tongue/throat swelling, SOB or lightheadedness with hypotension: Yes Has patient had a PCN reaction causing severe rash involving mucus membranes or skin necrosis: No Has patient had a PCN reaction that required hospitalization No Has patient had a PCN reaction occurring within the last 10 years: No If all of the above answers are "NO", then may proceed with Cephalosporin use.   Tramadol Rash   With itching      Medication List    STOP taking these medications   apixaban 2.5 MG Tabs tablet Commonly known as:  ELIQUIS   clopidogrel 75 MG tablet Commonly known as:  PLAVIX   diltiazem 120 MG 24 hr capsule Commonly known as:  CARDIZEM CD   fluticasone 50 MCG/ACT nasal spray Commonly known as:  FLONASE   megestrol 40 MG tablet Commonly  known as:  MEGACE   ondansetron 4 MG disintegrating tablet Commonly known as:  ZOFRAN-ODT   pantoprazole 40 MG tablet Commonly known as:  PROTONIX   silodosin 4 MG Caps capsule Commonly known as:  RAPAFLO     TAKE these medications   LORazepam 2 MG/ML injection Commonly known as:  ATIVAN Inject 0.5 mLs (1 mg total) into the vein every 4 (four) hours as needed for anxiety or sedation.   morphine CONCENTRATE 10 MG/0.5ML  Soln concentrated solution Take 0.5 mLs (10 mg total) by mouth every 2 (two) hours as needed for moderate pain, severe pain, anxiety or shortness of breath.         Diet and Activity recommendation: See Discharge Instructions above   Consults obtained -none   Major procedures and Radiology Reports - PLEASE review detailed and final reports for all details, in brief -     Ct Head Wo Contrast  Result Date: 09/25/2017 CLINICAL DATA:  Pt arrived via EMS from home with reports of difficulty swallowing that started when he woke up around 0600. Pt has hx CVA in February and has had some trouble swallowing since then, but reports worse today. EXAM: CT HEAD WITHOUT CONTRAST TECHNIQUE: Contiguous axial images were obtained from the base of the skull through the vertex without intravenous contrast. COMPARISON:  Head CT dated 07/31/2017. FINDINGS: Brain: Generalized age related parenchymal volume loss with commensurate dilatation of the ventricles and sulci. Mild chronic small vessel ischemic changes within the bilateral periventricular white matter regions. No mass, hemorrhage, edema or other evidence of acute parenchymal abnormality. No extra-axial hemorrhage. Vascular: Chronic calcified atherosclerotic changes of the large vessels at the skull base. No unexpected hyperdense vessel. Skull: Normal. Negative for fracture or focal lesion. Sinuses/Orbits: No acute finding. Other: None. IMPRESSION: 1. No acute findings.  No intracranial mass, hemorrhage or edema. 2. Chronic small vessel ischemic changes within the white matter. Electronically Signed   By: Franki Cabot M.D.   On: 09/25/2017 11:56   Ct Chest Wo Contrast  Result Date: 10/02/2017 CLINICAL DATA:  Left chest pain today.  Known pneumothorax. EXAM: CT CHEST WITHOUT CONTRAST TECHNIQUE: Multidetector CT imaging of the chest was performed following the standard protocol without IV contrast. COMPARISON:  Radiographs earlier today and 09/25/2017. Chest  CT 12/06/2007. FINDINGS: Cardiovascular: Diffuse atherosclerosis of the aorta, great vessels and coronary arteries. There are probable aortic valvular and mitral annular calcifications. The heart size is stable. There is no significant pericardial effusion. Mediastinum/Nodes: There are no enlarged mediastinal, hilar or axillary lymph nodes.Small mediastinal lymph nodes are stable. There is mild pneumomediastinum without evidence of mediastinal hematoma. There is grossly stable bilateral thyroid nodularity, measuring up to 1.5 cm inferiorly in the right lobe (image 28/2). No abnormality of the esophagus or trachea demonstrated. Lungs/Pleura: As seen on earlier radiographs, there is a small left sided pneumothorax with apical and anterior components. There is no right pneumothorax, mediastinal shift or significant pleural effusion. There is severe underlying chronic interstitial lung disease with progressive diffuse subpleural reticulation. Honeycomb formation is present at the lung bases, greater on the left. No confluent airspace opacity or suspicious pulmonary nodule. Upper abdomen: There are calcified gallstones and a small nonobstructing calculus in the upper pole of the right kidney. No acute findings identified. Musculoskeletal/Chest wall: There is no chest wall mass or suspicious osseous finding. IMPRESSION: 1. CT confirms the presence of a small left-sided pneumothorax. No evidence of tension component. 2. Mild pneumomediastinum. No mediastinal fluid collection or inflammation  identified. 3. Progressive chronic interstitial lung disease with subpleural reticulation and honeycomb formation at both lung bases from 2009 CT. 4. Coronary and Aortic Atherosclerosis (ICD10-I70.0). Electronically Signed   By: Richardean Sale M.D.   On: 10/02/2017 12:45   Mr Brain Wo Contrast  Result Date: 09/26/2017 CLINICAL DATA:  Subacute neuro deficit.  Trouble swallowing. EXAM: MRI HEAD WITHOUT CONTRAST TECHNIQUE:  Multiplanar, multiecho pulse sequences of the brain and surrounding structures were obtained without intravenous contrast. COMPARISON:  Head CT from yesterday.  Brain MRI 04/20/2017 FINDINGS: Brain: No acute or interval infarction, hemorrhage, hydrocephalus, extra-axial collection or mass lesion. Fairly typical for age chronic small vessel ischemia and cerebral volume loss. No abnormality seen in the brainstem, cisterns, or skull base Vascular: Major flow voids are preserved. Apparent wall thickening of the bilateral cervical ICA at the skull base is likely atheromatous. There is a similar appearance on prior. Skull and upper cervical spine: No evidence of marrow lesion Sinuses/Orbits: Bilateral cataract resection.  No acute finding. Other: The visualized oropharynx and hypopharynx is patent. IMPRESSION: Aging brain without acute finding or change from 04/20/2017. Electronically Signed   By: Monte Fantasia M.D.   On: 09/26/2017 16:16   Dg Chest Portable 1 View  Result Date: 10/07/2017 CLINICAL DATA:  Dyspnea evaluate for pneumothorax EXAM: PORTABLE CHEST 1 VIEW COMPARISON:  10/04/2017, chest CT 10/02/2017 FINDINGS: Smaller left apical pneumothorax is noted now extending only to the second rib level posteriorly versus to the third level previously. No pulmonary consolidation, effusion or dominant mass. Partial clearing of nodular infiltrates/densities in the left upper lobe since prior. Redemonstration of fine and coarse interstitial lung markings bilaterally. Heart size is top-normal. Atherosclerosis of the thoracic aorta with uncoiling noted. No aneurysm. Degenerative changes are noted of the dorsal spine. There is osteoarthritis with joint space narrowing and spurring of both AC joints and left glenohumeral joint. IMPRESSION: Smaller left apical pneumothorax since prior. Aortic atherosclerosis of borderline cardiomegaly. Chronic interstitial lung disease. Electronically Signed   By: Ashley Royalty M.D.   On:  10/07/2017 16:20   Dg Chest Port 1 View  Result Date: 10/04/2017 CLINICAL DATA:  History of pneumothorax.  Follow-up exam. EXAM: PORTABLE CHEST 1 VIEW COMPARISON:  Chest CT 10/02/2017; chest radiograph 10/02/2017 FINDINGS: Monitoring leads overlie the patient. Stable cardiomegaly. Mild pneumomediastinum. Aortic atherosclerosis. Re-demonstrated coarse interstitial opacities bilaterally. Persistent small left apical pneumothorax, given differences in patient positioning. Thoracic spine degenerative changes. IMPRESSION: Persistent small left apical pneumothorax and pneumomediastinum given differences in patient positioning. Chronic interstitial opacities. Electronically Signed   By: Lovey Newcomer M.D.   On: 10/04/2017 10:45   Dg Chest Port 1 View  Result Date: 10/02/2017 CLINICAL DATA:  Acute LEFT chest pain today. History of interstitial lung disease. EXAM: PORTABLE CHEST 1 VIEW COMPARISON:  09/25/2017 and prior chest radiographs. 12/06/2007 chest CT FINDINGS: An equivocal tiny LEFT apical and lateral pneumothorax noted. Recommend upright expiration chest radiograph. Cardiomediastinal silhouette is unchanged with UPPER limits normal heart size. Interstitial opacities, LEFT-greater-than-RIGHT, and elevated RIGHT hemidiaphragm are again noted. No large pleural effusion or acute bony abnormality identified. IMPRESSION: Equivocal tiny LEFT apical and lateral pneumothorax. Recommend upright expiration chest radiograph for initial further evaluation. No other acute abnormalities identified.  Chronic interstitial Electronically Signed   By: Margarette Canada M.D.   On: 10/02/2017 11:46   Dg Chest Portable 1 View  Result Date: 09/25/2017 CLINICAL DATA:  Difficulty swallowing this morning. History of CVA in February. EXAM: PORTABLE CHEST 1 VIEW COMPARISON:  Chest x-rays dated 08/19/2017 and 01/05/2016. FINDINGS: Heart size and mediastinal contours are stable. Coarse lung markings bilaterally are unchanged and consistent  with chronic interstitial lung disease/fibrosis. There is chronic blunting at the LEFT costophrenic angle, incompletely imaged. No new lung findings. No pleural effusion or pneumothorax seen. Chronic elevation of the RIGHT hemidiaphragm. No acute or suspicious osseous finding. IMPRESSION: 1. No active disease.  No evidence of pneumonia or pulmonary edema. 2. Chronic interstitial lung disease/fibrosis. Electronically Signed   By: Franki Cabot M.D.   On: 09/25/2017 12:15    Micro Results   No results found for this or any previous visit (from the past 240 hour(s)).     Today   Subjective:   Albert Boone stable for discharge  Objective:   Blood pressure (!) 161/98, pulse 93, temperature 97.7 F (36.5 C), temperature source Oral, resp. rate 17, height 5\' 8"  (1.727 m), weight 55.6 kg, SpO2 95 %.   Intake/Output Summary (Last 24 hours) at 10/08/2017 1341 Last data filed at 10/08/2017 1021 Gross per 24 hour  Intake 220 ml  Output -  Net 220 ml    Exam Sleeping  more, appears comfortable.  Coughing noted even with sips of water [place].AT,PERRAL Supple Neck,No JVD, No cervical lymphadenopathy appriciated.   RRR,No Gallops,Rubs or new Murmurs, No Parasternal Heave +ve B.Sounds, Abd Soft, Non tender, No organomegaly appriciated, No rebound -guarding or rigidity. No Cyanosis, Clubbing or edema, No new Rash or bruise  Data Review   CBC w Diff:  Lab Results  Component Value Date   WBC 11.2 (H) 10/08/2017   HGB 13.9 10/08/2017   HGB 13.8 05/29/2013   HCT 40.8 10/08/2017   HCT 42.4 05/29/2013   PLT 256 10/08/2017   PLT 140 (L) 05/29/2013   LYMPHOPCT 6 10/07/2017   MONOPCT 7 10/07/2017   EOSPCT 1 10/07/2017   BASOPCT 0 10/07/2017    CMP:  Lab Results  Component Value Date   NA 146 (H) 10/08/2017   NA 138 05/29/2013   K 3.9 10/08/2017   K 4.5 05/29/2013   CL 110 10/08/2017   CL 105 05/29/2013   CO2 27 10/08/2017   CO2 28 05/29/2013   BUN 20 10/08/2017   BUN 21 (H)  05/29/2013   CREATININE 1.05 10/08/2017   CREATININE 1.43 (H) 05/29/2013   PROT 7.8 10/07/2017   PROT 7.5 05/29/2013   ALBUMIN 3.9 10/07/2017   ALBUMIN 3.8 05/29/2013   BILITOT 1.1 10/07/2017   BILITOT 0.6 05/29/2013   ALKPHOS 50 10/07/2017   ALKPHOS 50 05/29/2013   AST 18 10/07/2017   AST 14 (L) 05/29/2013   ALT 13 10/07/2017   ALT 17 05/29/2013  .   Total Time in preparing paper work, data evaluation and todays exam - 36 minutes  Epifanio Lesches M.D on 10/08/2017 at 1:41 PM    Note: This dictation was prepared with Dragon dictation along with smaller phrase technology. Any transcriptional errors that result from this process are unintentional.

## 2017-10-08 NOTE — Progress Notes (Signed)
EMS present for pt discharge; discharge packet given to EMS personnel; pt discharged via stretcher to the [place]; telephone call to the [facility] [phone] advised D. Stephanie Acre RN that EMS is here for pt discharge

## 2017-10-08 NOTE — Care Management Obs Status (Signed)
MEDICARE OBSERVATION STATUS NOTIFICATION   Patient Details  Name: JORON VELIS MRN: [id number] Date of Birth: Feb 11, 1922   Medicare Observation Status Notification Given:  Yes    Lennyn Bellanca A Sacoya Mcgourty, RN 10/08/2017, 2:53 PM

## 2017-10-08 NOTE — Progress Notes (Signed)
MD order received to discharge pt to the [facility]osp[facility] today; Care Management previously established discharge packet for EMS personnel to take to the facility; telephone report called to [phone], spoke to Shirlean Mylar RN; pt's discharge pending arrival of EMS for nonemergency transport

## 2017-10-08 NOTE — Clinical Social Work Note (Signed)
CSW has left message for [facility] to receive update on bed availability for today. CSW is waiting for call back. CSW is following.  Santiago Bumpers, MSW, Latanya Presser [phone]

## 2017-10-10 ENCOUNTER — Other Ambulatory Visit: Payer: Self-pay

## 2017-10-10 NOTE — Patient Outreach (Signed)
[facility]) Care Management  10/10/2017  Albert Boone 04/22/1922 [id number]  Case Closure: Per review of EMR, the above patient was admitted to [facility] on 10/07/17 for End of Life Care. RN CM has been unsuccessful in establishing [place] with patient. Per discharge summary, patient has been discharge to [facility] on 10/08/17. Patient is no longer available for services through [facility].  Plan: Referral Closure  Albert Boone E. Rollene Rotunda RN, BSN [facility] Care Management Coordinator [phone]

## 2017-10-28 ENCOUNTER — Ambulatory Visit: Payer: PPO | Admitting: Urology

## 2017-11-11 DEATH — deceased

## 2017-12-28 IMAGING — CT CT HEAD W/O CM
3 series · 15 of 47 positions shown, 18 images · non-contrast
Comparison: Head CT 08/21/2015

CLINICAL DATA: Altered mental status

EXAM:
CT HEAD WITHOUT CONTRAST
TECHNIQUE: Contiguous axial images were obtained from the base of the skull
through the vertex without intravenous contrast.

[Series 2: head wo · axial · 0.59mm/px · z∈[-61,+74]mm · 9 of 33 slices shown, 12 images]
[im 3/33  brain]
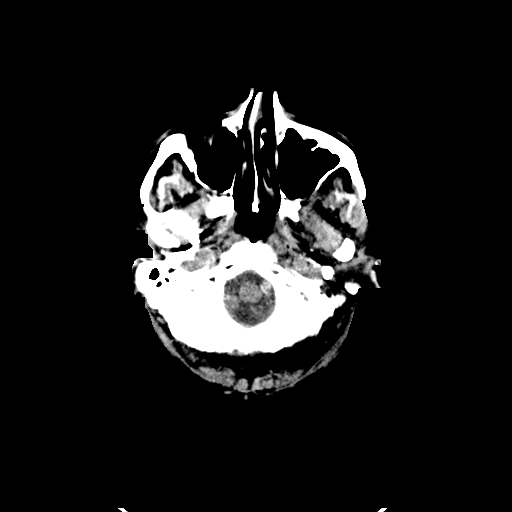
[im 3/33  bone]
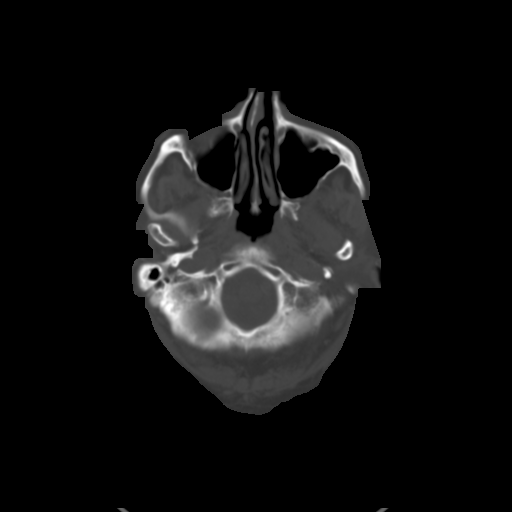
[im 6/33  brain]
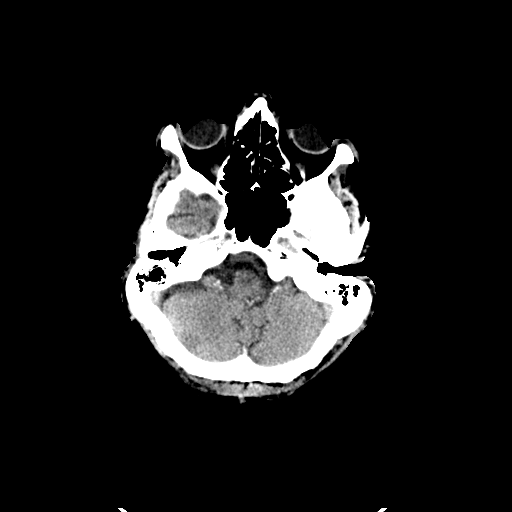
[im 9/33  brain]
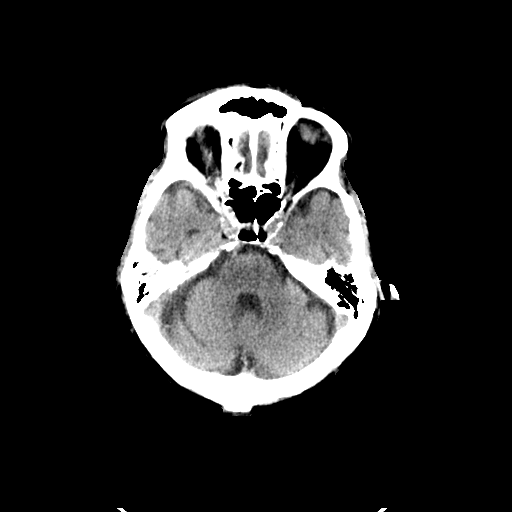
[im 13/33  brain]
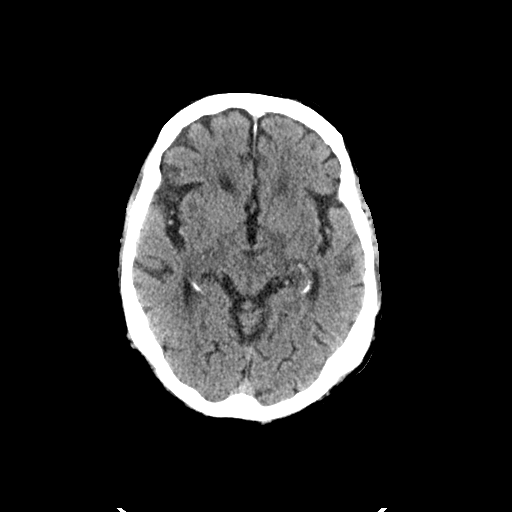
[im 17/33  brain]
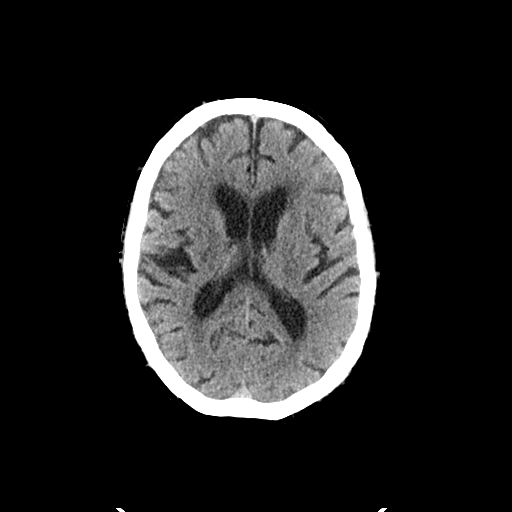
[im 17/33  bone]
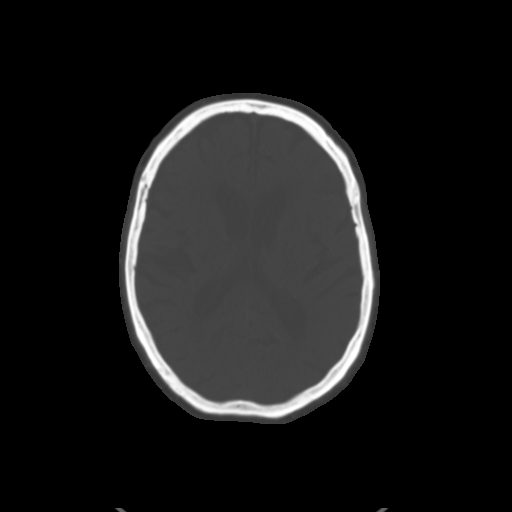
[im 20/33  brain]
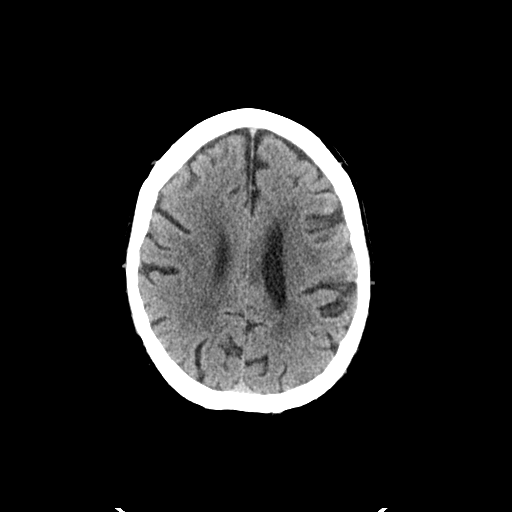
[im 24/33  brain]
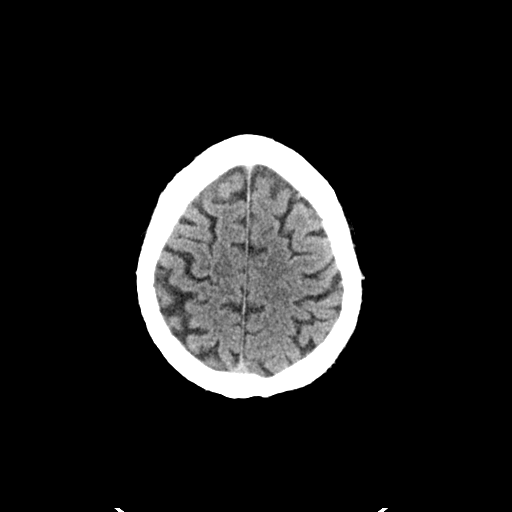
[im 27/33  brain]
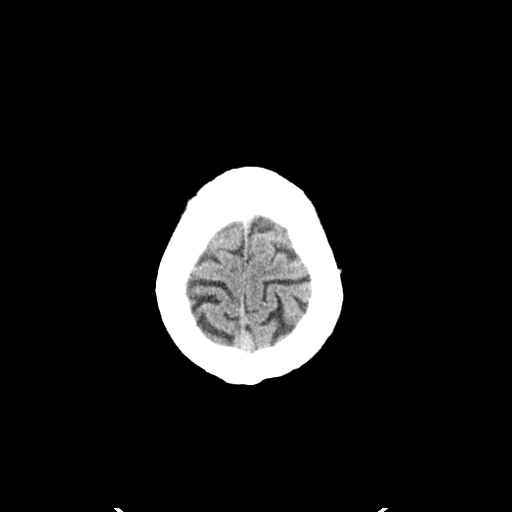
[im 30/33  brain]
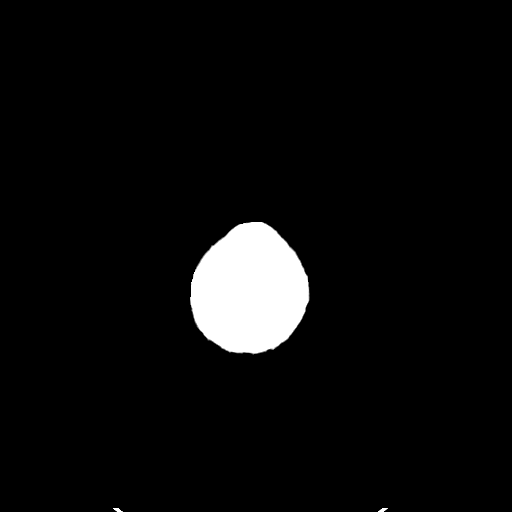
[im 30/33  bone]
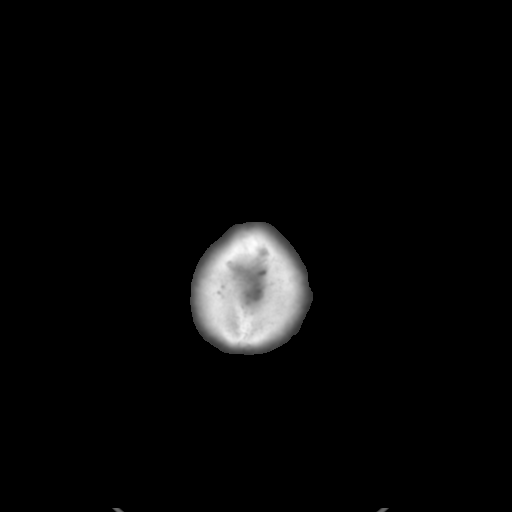

[Series 4: coronal soft tissue · coronal · 0.32mm/px · 3 of 64 slices shown]
[im 22/64  brain]
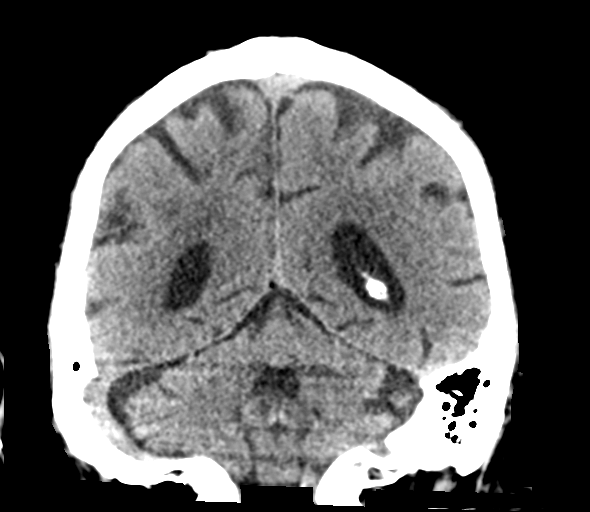
[im 29/64  brain]
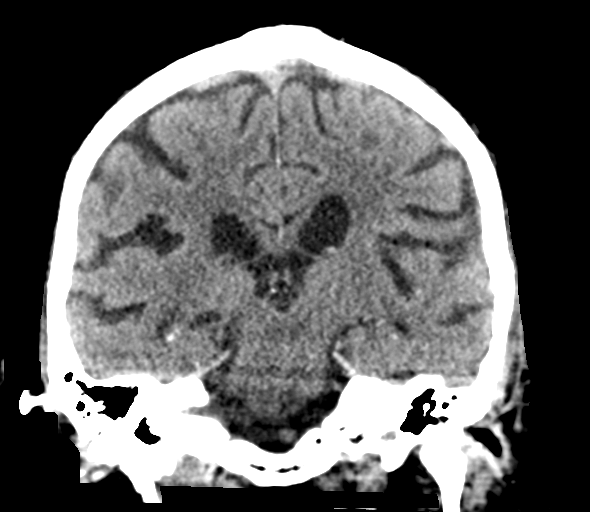
[im 36/64  brain]
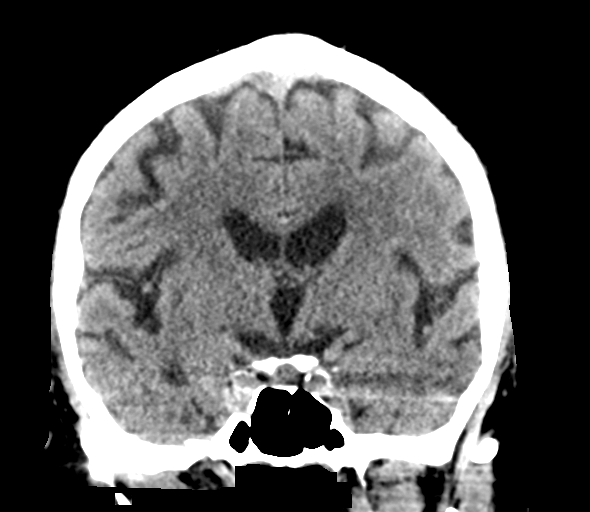

[Series 5: sagittal soft tissue · sagittal · 0.33mm/px · 3 of 50 slices shown]
[im 17/50  brain]
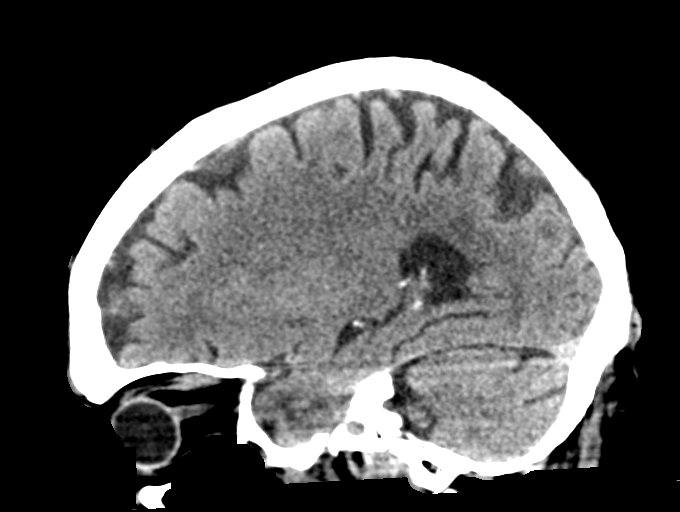
[im 25/50  brain]
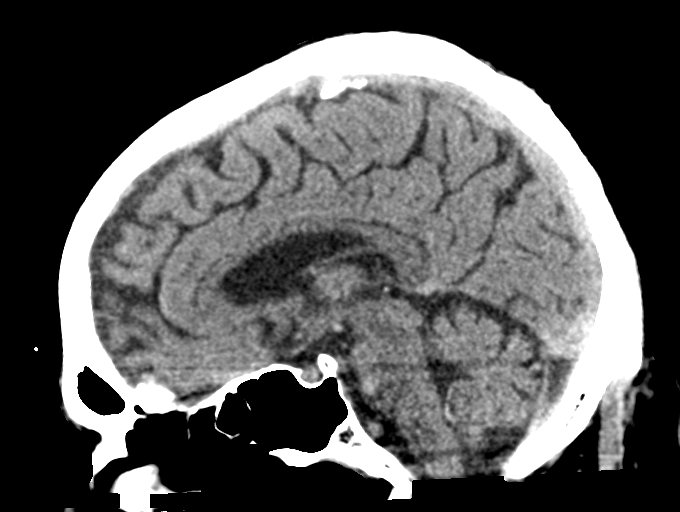
[im 33/50  brain]
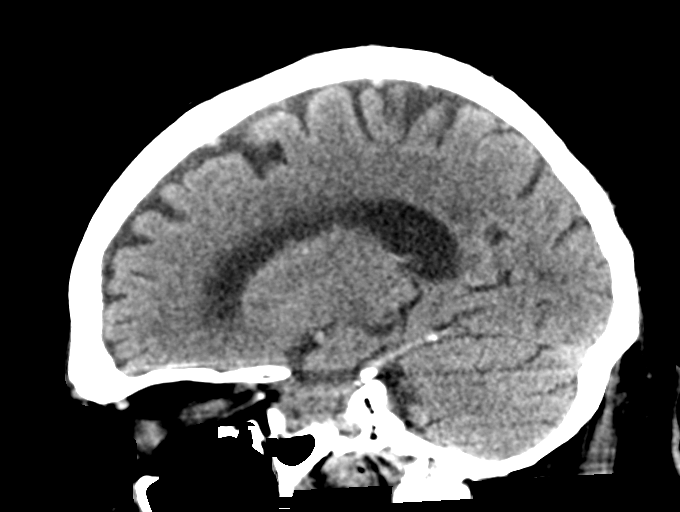

[15 of 47 positions shown; findings below may reference images not displayed]

FINDINGS: Brain: No mass lesion, intraparenchymal hemorrhage or extra-axial
collection. No evidence of acute cortical infarct. There is
periventricular hypoattenuation compatible with chronic
microvascular disease.

Vascular: Atherosclerotic calcification of the internal carotid
arteries at the skullbase.

Skull: Normal visualized skull base, calvarium and extracranial soft
tissues.

Sinuses/Orbits: No sinus fluid levels or advanced mucosal
thickening. No mastoid effusion. Normal orbits.
IMPRESSION: 1. No acute intracranial abnormality.
2. Chronic microvascular ischemia.
